# Patient Record
Sex: Female | Born: 1955
Health system: Southern US, Community
[De-identification: ages and names within clinical notes are randomized; demographics above are authoritative.]

## PROBLEM LIST (undated history)

## (undated) DIAGNOSIS — Z8774 Personal history of (corrected) congenital malformations of heart and circulatory system: Secondary | ICD-10-CM

## (undated) DIAGNOSIS — Z87730 Personal history of (corrected) cleft lip and palate: Secondary | ICD-10-CM

## (undated) DIAGNOSIS — Z8659 Personal history of other mental and behavioral disorders: Secondary | ICD-10-CM

## (undated) DIAGNOSIS — R718 Other abnormality of red blood cells: Secondary | ICD-10-CM

## (undated) DIAGNOSIS — M810 Age-related osteoporosis without current pathological fracture: Secondary | ICD-10-CM

## (undated) DIAGNOSIS — R011 Cardiac murmur, unspecified: Secondary | ICD-10-CM

## (undated) DIAGNOSIS — N8501 Benign endometrial hyperplasia: Secondary | ICD-10-CM

## (undated) DIAGNOSIS — K219 Gastro-esophageal reflux disease without esophagitis: Secondary | ICD-10-CM

## (undated) HISTORY — DX: Gastro-esophageal reflux disease without esophagitis: K21.9

## (undated) HISTORY — DX: Cardiac murmur, unspecified: R01.1

## (undated) HISTORY — DX: Personal history of (corrected) cleft lip and palate: Z87.730

## (undated) HISTORY — DX: Age-related osteoporosis without current pathological fracture: M81.0

## (undated) HISTORY — DX: Benign endometrial hyperplasia: N85.01

## (undated) HISTORY — DX: Personal history of other mental and behavioral disorders: Z86.59

## (undated) HISTORY — PX: CLEFT LIP REPAIR: SUR1164

## (undated) HISTORY — PX: ABDOMINAL SURGERY: SHX537

## (undated) HISTORY — DX: Other abnormality of red blood cells: R71.8

## (undated) HISTORY — DX: Personal history of (corrected) congenital malformations of heart and circulatory system: Z87.74

## (undated) HISTORY — PX: HERNIA REPAIR: SHX51

---
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
## 1997-07-09 ENCOUNTER — Encounter: Admission: RE | Admit: 1997-07-09 | Discharge: 1997-07-09 | Payer: Self-pay | Admitting: Sports Medicine

## 1997-08-06 ENCOUNTER — Encounter: Admission: RE | Admit: 1997-08-06 | Discharge: 1997-08-06 | Payer: Self-pay | Admitting: Sports Medicine

## 1997-09-07 ENCOUNTER — Encounter: Admission: RE | Admit: 1997-09-07 | Discharge: 1997-09-07 | Payer: Self-pay | Admitting: Family Medicine

## 1997-09-29 ENCOUNTER — Encounter: Admission: RE | Admit: 1997-09-29 | Discharge: 1997-09-29 | Payer: Self-pay | Admitting: Family Medicine

## 1997-11-05 ENCOUNTER — Encounter: Admission: RE | Admit: 1997-11-05 | Discharge: 1997-11-05 | Payer: Self-pay | Admitting: Sports Medicine

## 1998-03-29 ENCOUNTER — Encounter: Admission: RE | Admit: 1998-03-29 | Discharge: 1998-03-29 | Payer: Self-pay | Admitting: Family Medicine

## 1998-05-28 ENCOUNTER — Other Ambulatory Visit: Admission: RE | Admit: 1998-05-28 | Discharge: 1998-05-28 | Payer: Self-pay | Admitting: Gynecology

## 1998-07-02 ENCOUNTER — Encounter: Admission: RE | Admit: 1998-07-02 | Discharge: 1998-07-02 | Payer: Self-pay | Admitting: Sports Medicine

## 1998-08-05 ENCOUNTER — Encounter: Admission: RE | Admit: 1998-08-05 | Discharge: 1998-08-05 | Payer: Self-pay | Admitting: Sports Medicine

## 1999-01-25 ENCOUNTER — Encounter: Admission: RE | Admit: 1999-01-25 | Discharge: 1999-01-25 | Payer: Self-pay | Admitting: Sports Medicine

## 1999-02-03 ENCOUNTER — Encounter: Admission: RE | Admit: 1999-02-03 | Discharge: 1999-02-03 | Payer: Self-pay | Admitting: Family Medicine

## 1999-02-16 ENCOUNTER — Encounter: Admission: RE | Admit: 1999-02-16 | Discharge: 1999-02-16 | Payer: Self-pay | Admitting: Family Medicine

## 1999-03-08 ENCOUNTER — Encounter: Admission: RE | Admit: 1999-03-08 | Discharge: 1999-03-08 | Payer: Self-pay | Admitting: Family Medicine

## 1999-07-21 ENCOUNTER — Encounter: Admission: RE | Admit: 1999-07-21 | Discharge: 1999-07-21 | Payer: Self-pay | Admitting: Family Medicine

## 1999-10-04 ENCOUNTER — Ambulatory Visit (HOSPITAL_COMMUNITY): Admission: RE | Admit: 1999-10-04 | Discharge: 1999-10-04 | Payer: Self-pay | Admitting: Sports Medicine

## 1999-10-04 ENCOUNTER — Encounter: Payer: Self-pay | Admitting: Sports Medicine

## 2000-01-25 ENCOUNTER — Encounter: Admission: RE | Admit: 2000-01-25 | Discharge: 2000-01-25 | Payer: Self-pay | Admitting: Family Medicine

## 2000-02-16 ENCOUNTER — Encounter: Admission: RE | Admit: 2000-02-16 | Discharge: 2000-02-16 | Payer: Self-pay | Admitting: Sports Medicine

## 2000-02-18 DIAGNOSIS — N8501 Benign endometrial hyperplasia: Secondary | ICD-10-CM

## 2000-02-18 HISTORY — DX: Benign endometrial hyperplasia: N85.01

## 2000-02-21 ENCOUNTER — Encounter (HOSPITAL_COMMUNITY): Admission: RE | Admit: 2000-02-21 | Discharge: 2000-05-21 | Payer: Self-pay | Admitting: Sports Medicine

## 2000-02-23 ENCOUNTER — Other Ambulatory Visit: Admission: RE | Admit: 2000-02-23 | Discharge: 2000-02-23 | Payer: Self-pay | Admitting: Gynecology

## 2000-02-23 ENCOUNTER — Encounter (INDEPENDENT_AMBULATORY_CARE_PROVIDER_SITE_OTHER): Payer: Self-pay

## 2000-03-08 ENCOUNTER — Other Ambulatory Visit: Admission: RE | Admit: 2000-03-08 | Discharge: 2000-03-08 | Payer: Self-pay | Admitting: Gynecology

## 2000-04-05 ENCOUNTER — Encounter: Admission: RE | Admit: 2000-04-05 | Discharge: 2000-04-05 | Payer: Self-pay | Admitting: Sports Medicine

## 2000-05-01 ENCOUNTER — Ambulatory Visit: Admission: RE | Admit: 2000-05-01 | Discharge: 2000-05-01 | Payer: Self-pay | Admitting: Gynecology

## 2000-06-12 ENCOUNTER — Encounter: Admission: RE | Admit: 2000-06-12 | Discharge: 2000-06-12 | Payer: Self-pay | Admitting: Sports Medicine

## 2000-08-27 ENCOUNTER — Encounter: Admission: RE | Admit: 2000-08-27 | Discharge: 2000-08-27 | Payer: Self-pay | Admitting: Family Medicine

## 2000-08-29 ENCOUNTER — Encounter: Admission: RE | Admit: 2000-08-29 | Discharge: 2000-08-29 | Payer: Self-pay | Admitting: Family Medicine

## 2000-11-13 ENCOUNTER — Encounter: Admission: RE | Admit: 2000-11-13 | Discharge: 2000-11-13 | Payer: Self-pay | Admitting: Family Medicine

## 2000-12-13 ENCOUNTER — Encounter: Admission: RE | Admit: 2000-12-13 | Discharge: 2000-12-13 | Payer: Self-pay | Admitting: Sports Medicine

## 2001-01-11 ENCOUNTER — Emergency Department (HOSPITAL_COMMUNITY): Admission: EM | Admit: 2001-01-11 | Discharge: 2001-01-11 | Payer: Self-pay | Admitting: Emergency Medicine

## 2001-01-28 ENCOUNTER — Encounter: Admission: RE | Admit: 2001-01-28 | Discharge: 2001-01-28 | Payer: Self-pay | Admitting: Family Medicine

## 2001-02-06 ENCOUNTER — Encounter: Admission: RE | Admit: 2001-02-06 | Discharge: 2001-02-06 | Payer: Self-pay | Admitting: Family Medicine

## 2001-09-18 ENCOUNTER — Encounter: Admission: RE | Admit: 2001-09-18 | Discharge: 2001-09-18 | Payer: Self-pay | Admitting: Family Medicine

## 2001-10-17 ENCOUNTER — Encounter: Admission: RE | Admit: 2001-10-17 | Discharge: 2001-10-17 | Payer: Self-pay | Admitting: Sports Medicine

## 2001-11-22 ENCOUNTER — Ambulatory Visit (HOSPITAL_COMMUNITY): Admission: RE | Admit: 2001-11-22 | Discharge: 2001-11-22 | Payer: Self-pay | Admitting: Family Medicine

## 2001-11-22 ENCOUNTER — Encounter: Admission: RE | Admit: 2001-11-22 | Discharge: 2001-11-22 | Payer: Self-pay | Admitting: Family Medicine

## 2002-01-23 ENCOUNTER — Encounter: Admission: RE | Admit: 2002-01-23 | Discharge: 2002-01-23 | Payer: Self-pay | Admitting: Sports Medicine

## 2002-03-25 ENCOUNTER — Other Ambulatory Visit: Admission: RE | Admit: 2002-03-25 | Discharge: 2002-03-25 | Payer: Self-pay | Admitting: Gynecology

## 2002-04-16 ENCOUNTER — Encounter: Admission: RE | Admit: 2002-04-16 | Discharge: 2002-04-16 | Payer: Self-pay | Admitting: Family Medicine

## 2002-10-30 ENCOUNTER — Encounter: Admission: RE | Admit: 2002-10-30 | Discharge: 2002-10-30 | Payer: Self-pay | Admitting: Family Medicine

## 2002-12-11 ENCOUNTER — Encounter: Admission: RE | Admit: 2002-12-11 | Discharge: 2002-12-11 | Payer: Self-pay | Admitting: Sports Medicine

## 2003-01-15 ENCOUNTER — Encounter: Admission: RE | Admit: 2003-01-15 | Discharge: 2003-01-15 | Payer: Self-pay | Admitting: Family Medicine

## 2003-05-20 ENCOUNTER — Encounter: Admission: RE | Admit: 2003-05-20 | Discharge: 2003-05-20 | Payer: Self-pay | Admitting: Sports Medicine

## 2003-05-20 ENCOUNTER — Encounter: Admission: RE | Admit: 2003-05-20 | Discharge: 2003-05-20 | Payer: Self-pay | Admitting: Family Medicine

## 2003-06-25 ENCOUNTER — Encounter: Admission: RE | Admit: 2003-06-25 | Discharge: 2003-06-25 | Payer: Self-pay | Admitting: Sports Medicine

## 2003-06-25 ENCOUNTER — Encounter: Admission: RE | Admit: 2003-06-25 | Discharge: 2003-06-25 | Payer: Self-pay | Admitting: Family Medicine

## 2003-10-08 ENCOUNTER — Other Ambulatory Visit: Admission: RE | Admit: 2003-10-08 | Discharge: 2003-10-08 | Payer: Self-pay | Admitting: Gynecology

## 2003-10-13 ENCOUNTER — Encounter: Admission: RE | Admit: 2003-10-13 | Discharge: 2003-10-13 | Payer: Self-pay | Admitting: Gynecology

## 2004-01-04 ENCOUNTER — Ambulatory Visit: Payer: Self-pay | Admitting: Sports Medicine

## 2004-08-08 ENCOUNTER — Ambulatory Visit: Payer: Self-pay | Admitting: Family Medicine

## 2005-03-29 ENCOUNTER — Other Ambulatory Visit: Admission: RE | Admit: 2005-03-29 | Discharge: 2005-03-29 | Payer: Self-pay | Admitting: Gynecology

## 2005-06-08 ENCOUNTER — Ambulatory Visit: Payer: Self-pay | Admitting: Sports Medicine

## 2005-08-03 ENCOUNTER — Ambulatory Visit: Payer: Self-pay | Admitting: Family Medicine

## 2005-08-31 ENCOUNTER — Ambulatory Visit: Payer: Self-pay | Admitting: Sports Medicine

## 2005-10-06 ENCOUNTER — Ambulatory Visit: Payer: Self-pay | Admitting: Family Medicine

## 2005-11-30 ENCOUNTER — Ambulatory Visit: Payer: Self-pay | Admitting: Sports Medicine

## 2006-05-17 DIAGNOSIS — K219 Gastro-esophageal reflux disease without esophagitis: Secondary | ICD-10-CM | POA: Insufficient documentation

## 2006-05-17 DIAGNOSIS — F339 Major depressive disorder, recurrent, unspecified: Secondary | ICD-10-CM | POA: Insufficient documentation

## 2006-05-17 DIAGNOSIS — R4589 Other symptoms and signs involving emotional state: Secondary | ICD-10-CM | POA: Insufficient documentation

## 2006-05-17 DIAGNOSIS — R1310 Dysphagia, unspecified: Secondary | ICD-10-CM | POA: Insufficient documentation

## 2006-05-24 ENCOUNTER — Ambulatory Visit: Payer: Self-pay | Admitting: Sports Medicine

## 2006-05-24 DIAGNOSIS — J329 Chronic sinusitis, unspecified: Secondary | ICD-10-CM | POA: Insufficient documentation

## 2006-06-05 ENCOUNTER — Encounter: Admission: RE | Admit: 2006-06-05 | Discharge: 2006-06-05 | Payer: Self-pay | Admitting: Sports Medicine

## 2006-07-26 ENCOUNTER — Ambulatory Visit: Payer: Self-pay | Admitting: Sports Medicine

## 2006-07-26 ENCOUNTER — Encounter (INDEPENDENT_AMBULATORY_CARE_PROVIDER_SITE_OTHER): Payer: Self-pay | Admitting: *Deleted

## 2006-07-26 DIAGNOSIS — Z8774 Personal history of (corrected) congenital malformations of heart and circulatory system: Secondary | ICD-10-CM | POA: Insufficient documentation

## 2006-07-27 ENCOUNTER — Telehealth: Payer: Self-pay | Admitting: Psychology

## 2006-08-09 ENCOUNTER — Ambulatory Visit: Payer: Self-pay | Admitting: Family Medicine

## 2006-08-27 ENCOUNTER — Ambulatory Visit: Payer: Self-pay | Admitting: Psychology

## 2006-09-10 ENCOUNTER — Ambulatory Visit: Payer: Self-pay | Admitting: Sports Medicine

## 2006-09-12 ENCOUNTER — Ambulatory Visit: Payer: Self-pay | Admitting: Family Medicine

## 2006-09-17 ENCOUNTER — Ambulatory Visit: Payer: Self-pay | Admitting: Psychology

## 2006-09-25 ENCOUNTER — Encounter: Payer: Self-pay | Admitting: Sports Medicine

## 2006-10-17 ENCOUNTER — Telehealth: Payer: Self-pay | Admitting: Psychology

## 2006-10-31 ENCOUNTER — Encounter: Payer: Self-pay | Admitting: Sports Medicine

## 2006-12-20 ENCOUNTER — Ambulatory Visit: Payer: Self-pay | Admitting: Sports Medicine

## 2006-12-26 ENCOUNTER — Telehealth: Payer: Self-pay | Admitting: *Deleted

## 2007-02-08 ENCOUNTER — Telehealth: Payer: Self-pay | Admitting: Sports Medicine

## 2007-02-20 ENCOUNTER — Encounter: Payer: Self-pay | Admitting: *Deleted

## 2007-03-01 ENCOUNTER — Ambulatory Visit: Payer: Self-pay | Admitting: Family Medicine

## 2007-03-01 ENCOUNTER — Encounter: Payer: Self-pay | Admitting: Family Medicine

## 2007-04-08 ENCOUNTER — Encounter: Payer: Self-pay | Admitting: Sports Medicine

## 2007-05-30 ENCOUNTER — Encounter: Payer: Self-pay | Admitting: Sports Medicine

## 2007-05-30 ENCOUNTER — Encounter: Payer: Self-pay | Admitting: *Deleted

## 2007-06-12 ENCOUNTER — Encounter: Admission: RE | Admit: 2007-06-12 | Discharge: 2007-06-12 | Payer: Self-pay | Admitting: Sports Medicine

## 2007-06-18 ENCOUNTER — Telehealth: Payer: Self-pay | Admitting: *Deleted

## 2007-07-31 ENCOUNTER — Encounter: Admission: RE | Admit: 2007-07-31 | Discharge: 2007-07-31 | Payer: Self-pay | Admitting: Sports Medicine

## 2007-08-06 ENCOUNTER — Encounter (INDEPENDENT_AMBULATORY_CARE_PROVIDER_SITE_OTHER): Payer: Self-pay | Admitting: *Deleted

## 2007-08-20 ENCOUNTER — Ambulatory Visit: Payer: Self-pay | Admitting: Sports Medicine

## 2007-09-19 ENCOUNTER — Telehealth: Payer: Self-pay | Admitting: *Deleted

## 2007-10-16 ENCOUNTER — Ambulatory Visit: Payer: Self-pay | Admitting: Family Medicine

## 2007-10-22 ENCOUNTER — Ambulatory Visit: Payer: Self-pay | Admitting: Family Medicine

## 2007-10-24 ENCOUNTER — Ambulatory Visit: Payer: Self-pay | Admitting: Family Medicine

## 2007-11-07 ENCOUNTER — Ambulatory Visit: Payer: Self-pay | Admitting: Sports Medicine

## 2007-11-07 DIAGNOSIS — Q675 Congenital deformity of spine: Secondary | ICD-10-CM | POA: Insufficient documentation

## 2007-11-07 LAB — CONVERTED CEMR LAB
ALT: 10 units/L (ref 0–35)
AST: 15 units/L (ref 0–37)
Albumin: 4.7 g/dL (ref 3.5–5.2)
Alkaline Phosphatase: 86 units/L (ref 39–117)
BUN: 14 mg/dL (ref 6–23)
CO2: 27 meq/L (ref 19–32)
Calcium: 9.6 mg/dL (ref 8.4–10.5)
Chloride: 105 meq/L (ref 96–112)
Cholesterol: 194 mg/dL (ref 0–200)
Creatinine, Ser: 0.62 mg/dL (ref 0.40–1.20)
Glucose, Bld: 87 mg/dL (ref 70–99)
HCT: 38.4 % (ref 36.0–46.0)
HDL: 65 mg/dL (ref >39)
Hemoglobin: 11.6 g/dL — ABNORMAL LOW (ref 12.0–15.0)
LDL Cholesterol: 116 mg/dL — ABNORMAL HIGH (ref 0–99)
MCHC: 30.2 g/dL (ref 30.0–36.0)
MCV: 71 fL — ABNORMAL LOW (ref 78.0–100.0)
Platelets: 208 10*3/uL (ref 150–400)
Potassium: 5 meq/L (ref 3.5–5.3)
RBC: 5.41 M/uL — ABNORMAL HIGH (ref 3.87–5.11)
RDW: 17.2 % — ABNORMAL HIGH (ref 11.5–15.5)
Sodium: 144 meq/L (ref 135–145)
Total Bilirubin: 0.6 mg/dL (ref 0.3–1.2)
Total CHOL/HDL Ratio: 3
Total Protein: 7.3 g/dL (ref 6.0–8.3)
Triglycerides: 63 mg/dL (ref <150)
VLDL: 13 mg/dL (ref 0–40)
WBC: 4.8 10*3/uL (ref 4.0–10.5)

## 2007-11-12 ENCOUNTER — Telehealth (INDEPENDENT_AMBULATORY_CARE_PROVIDER_SITE_OTHER): Payer: Self-pay | Admitting: *Deleted

## 2007-12-02 ENCOUNTER — Encounter: Payer: Self-pay | Admitting: *Deleted

## 2007-12-05 ENCOUNTER — Encounter: Payer: Self-pay | Admitting: Sports Medicine

## 2007-12-19 ENCOUNTER — Encounter: Payer: Self-pay | Admitting: Family Medicine

## 2007-12-30 ENCOUNTER — Encounter: Payer: Self-pay | Admitting: *Deleted

## 2007-12-30 ENCOUNTER — Ambulatory Visit: Payer: Self-pay | Admitting: Family Medicine

## 2008-01-07 ENCOUNTER — Ambulatory Visit: Payer: Self-pay | Admitting: Family Medicine

## 2008-01-08 ENCOUNTER — Encounter: Payer: Self-pay | Admitting: Family Medicine

## 2008-02-25 ENCOUNTER — Ambulatory Visit: Payer: Self-pay | Admitting: Family Medicine

## 2008-02-25 DIAGNOSIS — G252 Other specified forms of tremor: Secondary | ICD-10-CM

## 2008-02-25 DIAGNOSIS — M818 Other osteoporosis without current pathological fracture: Secondary | ICD-10-CM | POA: Insufficient documentation

## 2008-02-25 DIAGNOSIS — G250 Essential tremor: Secondary | ICD-10-CM | POA: Insufficient documentation

## 2008-02-26 ENCOUNTER — Ambulatory Visit: Payer: Self-pay | Admitting: Family Medicine

## 2008-02-26 ENCOUNTER — Encounter (INDEPENDENT_AMBULATORY_CARE_PROVIDER_SITE_OTHER): Payer: Self-pay | Admitting: *Deleted

## 2008-02-26 ENCOUNTER — Encounter: Payer: Self-pay | Admitting: Family Medicine

## 2008-02-26 LAB — CONVERTED CEMR LAB
BUN: 16 mg/dL (ref 6–23)
Basophils Absolute: 0 10*3/uL (ref 0.0–0.1)
Basophils Relative: 1 % (ref 0–1)
CO2: 22 meq/L (ref 19–32)
Calcium: 9.4 mg/dL (ref 8.4–10.5)
Chloride: 104 meq/L (ref 96–112)
Creatinine, Ser: 0.73 mg/dL (ref 0.40–1.20)
Eosinophils Absolute: 0.1 10*3/uL (ref 0.0–0.7)
Eosinophils Relative: 2 % (ref 0–5)
Glucose, Bld: 85 mg/dL (ref 70–99)
HCT: 38.5 % (ref 36.0–46.0)
Hemoglobin: 12.2 g/dL (ref 12.0–15.0)
Lymphocytes Relative: 30 % (ref 12–46)
Lymphs Abs: 1.2 10*3/uL (ref 0.7–4.0)
MCHC: 31.7 g/dL (ref 30.0–36.0)
MCV: 73.3 fL — ABNORMAL LOW (ref 78.0–100.0)
Monocytes Absolute: 0.5 10*3/uL (ref 0.1–1.0)
Monocytes Relative: 11 % (ref 3–12)
Neutro Abs: 2.3 10*3/uL (ref 1.7–7.7)
Neutrophils Relative %: 56 % (ref 43–77)
Platelets: 206 10*3/uL (ref 150–400)
Potassium: 4 meq/L (ref 3.5–5.3)
RBC: 5.25 M/uL — ABNORMAL HIGH (ref 3.87–5.11)
RDW: 16.1 % — ABNORMAL HIGH (ref 11.5–15.5)
Sodium: 140 meq/L (ref 135–145)
TSH: 1.519 microintl units/mL (ref 0.350–4.50)
Vit D, 1,25-Dihydroxy: 19 — ABNORMAL LOW (ref 30–89)
WBC: 4.1 10*3/uL (ref 4.0–10.5)

## 2008-03-26 ENCOUNTER — Encounter: Payer: Self-pay | Admitting: Family Medicine

## 2008-08-26 ENCOUNTER — Encounter: Admission: RE | Admit: 2008-08-26 | Discharge: 2008-08-26 | Payer: Self-pay | Admitting: Family Medicine

## 2008-10-19 ENCOUNTER — Ambulatory Visit: Payer: Self-pay | Admitting: Family Medicine

## 2008-10-21 ENCOUNTER — Ambulatory Visit: Payer: Self-pay | Admitting: Family Medicine

## 2008-11-11 ENCOUNTER — Encounter: Payer: Self-pay | Admitting: Family Medicine

## 2008-11-11 ENCOUNTER — Ambulatory Visit: Payer: Self-pay | Admitting: Family Medicine

## 2008-12-22 ENCOUNTER — Ambulatory Visit: Payer: Self-pay | Admitting: Family Medicine

## 2009-04-23 ENCOUNTER — Ambulatory Visit: Payer: Self-pay | Admitting: Family Medicine

## 2009-04-23 ENCOUNTER — Telehealth: Payer: Self-pay | Admitting: Family Medicine

## 2009-04-30 ENCOUNTER — Ambulatory Visit: Payer: Self-pay | Admitting: Family Medicine

## 2009-04-30 DIAGNOSIS — J309 Allergic rhinitis, unspecified: Secondary | ICD-10-CM | POA: Insufficient documentation

## 2009-06-22 ENCOUNTER — Encounter: Payer: Self-pay | Admitting: Family Medicine

## 2009-07-02 ENCOUNTER — Ambulatory Visit: Payer: Self-pay | Admitting: Family Medicine

## 2009-07-02 DIAGNOSIS — H6980 Other specified disorders of Eustachian tube, unspecified ear: Secondary | ICD-10-CM | POA: Insufficient documentation

## 2009-07-19 ENCOUNTER — Ambulatory Visit: Payer: Self-pay | Admitting: Family Medicine

## 2009-07-21 ENCOUNTER — Ambulatory Visit: Payer: Self-pay | Admitting: Family Medicine

## 2009-07-21 ENCOUNTER — Telehealth: Payer: Self-pay | Admitting: Family Medicine

## 2009-07-22 ENCOUNTER — Telehealth (INDEPENDENT_AMBULATORY_CARE_PROVIDER_SITE_OTHER): Payer: Self-pay | Admitting: *Deleted

## 2009-08-19 ENCOUNTER — Encounter: Payer: Self-pay | Admitting: Family Medicine

## 2009-08-26 ENCOUNTER — Encounter: Admission: RE | Admit: 2009-08-26 | Discharge: 2009-08-26 | Payer: Self-pay | Admitting: Family Medicine

## 2009-08-30 ENCOUNTER — Encounter: Payer: Self-pay | Admitting: Family Medicine

## 2009-11-30 ENCOUNTER — Telehealth: Payer: Self-pay | Admitting: *Deleted

## 2009-12-01 ENCOUNTER — Encounter: Payer: Self-pay | Admitting: Family Medicine

## 2009-12-22 ENCOUNTER — Ambulatory Visit: Payer: Self-pay | Admitting: Family Medicine

## 2010-01-20 ENCOUNTER — Encounter: Payer: Self-pay | Admitting: Family Medicine

## 2010-04-10 ENCOUNTER — Encounter: Payer: Self-pay | Admitting: Family Medicine

## 2010-04-15 ENCOUNTER — Ambulatory Visit: Admission: RE | Admit: 2010-04-15 | Discharge: 2010-04-15 | Payer: Self-pay | Source: Home / Self Care

## 2010-04-19 NOTE — Assessment & Plan Note (Signed)
Summary: something stuck under nail wp   Vital Signs:  Patient Profile:   55 Years Old Female Height:     65.25 inches Weight:      118.1 pounds Temp:     97.9 degrees F Pulse rate:   82 / minute BP sitting:   129 / 73  (left arm)  Pt. in pain?   yes    Location:   finger    Intensity:   1    Type:       sharp  Vitals Entered By: Theresia Lo RN (October 16, 2007 1:32 PM)              Is Patient Diabetic? No     PCP:  Enid Baas MD  Chief Complaint:  splinter under nail.  History of Present Illness: CC:  splinter Pt fell against wooden dresser this am.  Splinter lodged under nail of middle finger of R hand.  Mild pain.  No bleeding.  Tried to remove w/ pin but unable.  Not up to date on tetanus       Review of Systems      See HPI   Physical Exam  General:     Well-developed,well-nourished,in no acute distress; alert,appropriate and cooperative throughout examination Extremities:     3 mm splinter R middle finger.  very small part protrudes from outside nail.  No erythema, bleeding    Impression & Recommendations:  Problem # 1:  ACC CAUSED OTH SPEC CUT&PIERCING INSTRUM/OBJS (ICD-E920.8) Assessment: New Splinter removed w/ forcepts.  BAnd-Aid applied.  f/u as needed .  tetanus shot given.  No evidence of infection.  Precepted...initially thought we may need digital block for nail removal.   Orders: [facility]- Est Level  3 (86578)   Other Orders: Tdap => 6yrs IM (46962) Admin 1st Vaccine (95284)   Patient Instructions: 1)  Please schedule a follow-up appointment as needed. 2)  If your finger becomes red and swollen, return to clinic   ]    Tetanus/Td Vaccine    Vaccine Type: Tdap    Site: left deltoid    Mfr: Sanofi Pasteur    Dose: 0.5 ml    Route: IM    Given by: Theresia Lo RN    Exp. Date: 06/18/2009    Lot #: [id number]    VIS given: 02/05/07 version given October 16, 2007.

## 2010-04-19 NOTE — Letter (Signed)
Summary: Generic Letter  [facility]  [address]   [place], [place] [address]   Phone: [phone]  Fax: [phone]    02/26/2008  Marie Harvey [address] [place], [place]  [address]  Dear Ms. FUENTE,   It was a pleasure to see you in the office yesterday.  I have received the results of your lab studies, and I am glad to report that the majority of the results are within the normal range.   The only abnormality was the Vitamin D level, which is low.  Therefore, I recommend that you take prescription-strength Vitamin D, 50,000 International Units (IUs), once weekly for six weeks.  We will recheck the Vitamin D level about 3 months after completing the six weeks of weekly treatments.   I will send the prescription for the once-weekly Vitamin D tablets to your pharmacy.  Please call if you have any questions.    Sincerely,     Paula Compton MD [facility]  Appended Document: Generic Letter sent

## 2010-04-19 NOTE — Assessment & Plan Note (Signed)
Summary: flu shot,tcb  Nurse Visit   Vital Signs:  Patient profile:   55 year old female Temp:     98.6 degrees F  Vitals Entered By: Theresia Lo RN (December 22, 2009 10:35 AM)  Allergies: 1)  ! Penicillin 2)  Codeine 3)  * Cannot Take Gel Caps Due To Swallowing Problems  Immunizations Administered:  Influenza Vaccine # 1:    Vaccine Type: Fluvax Non-MCR    Site: right deltoid    Mfr: GlaxoSmithKline    Dose: 0.5 ml    Route: IM    Given by: Theresia Lo RN    Exp. Date: 09/14/2010    Lot #: [id number]    VIS given: 10/12/09 version given December 22, 2009.  Flu Vaccine Consent Questions:    Do you have a history of severe allergic reactions to this vaccine? no    Any prior history of allergic reactions to egg and/or gelatin? no    Do you have a sensitivity to the preservative Thimersol? no    Do you have a past history of Guillan-Barre Syndrome? no    Do you currently have an acute febrile illness? no    Have you ever had a severe reaction to latex? no    Vaccine information given and explained to patient? yes    Are you currently pregnant? no  Orders Added: 1)  Influenza Vaccine NON MCR [00028] 2)  Admin 1st Vaccine [47829]

## 2010-04-19 NOTE — Letter (Signed)
Summary: Out of Work  [facility]  [address]   [place], [place] [address]   Phone: [phone]  Fax: [phone]    December 01, 2009   Patient:  Marie Harvey DOB: September 25, 1955   To Whom It May Concern:   This letter is to attest that [name] is a patient in this office, and she does not require antibiotic prophylaxis for dental treatments.    If you need additional information, please feel free to contact our office.         Sincerely,    Paula Compton MD  Appended Document: Out of Work called pt, says she will pick up letter tomorrow. left up front.

## 2010-04-19 NOTE — Assessment & Plan Note (Signed)
Summary: f/up per dr.fields/ts   Vital Signs:  Patient Profile:   55 Years Old Female Weight:      100 pounds Pulse rate:   81 / minute BP sitting:   106 / 55  Vitals Entered By: Lillia Pauls CMA (Jul 26, 2006 9:59 AM)               Chief Complaint:  fu.  History of Present Illness: Esophageal narrowing and dysphagia: Barium swallow shoes retiention of 13 MM tablet pills very difficult to swallow Foods - most she can swallow OK if she chews well No problem with liquids  Always thin.  hard to say if this has limited her weight gain.  appetite is fine. small meals and does not eat a lot of snacks. slow eater.  Hx of pneumonia in 2005 This triggered a lot fo reflux and burping Now no real reflux sxs  Now uses zyrtec when it rains mostly Sinuses pretty good this month This will also trigger headaches  Depression is moderate currently.  Has not yet called Dr. Pascal Lux.  Current Problems:  DM W/COMPLICATION NOS, TYPE II, UNCONTROLLED (ICD-250.92) RENAL FAILURE (ICD-586) SINUSITIS, CHRONIC, NOS (ICD-473.9) GASTROESOPHAGEAL REFLUX, NO ESOPHAGITIS (ICD-530.81) DYSPHAGIA (ICD-787.2) DEPRESSION, MAJOR, RECURRENT (ICD-296.30) ANEMIA, ACUTE BLOOD LOSS (ICD-285.1) DYSPHAGIA, UNSPECIFIED (ICD-787.20) SYMPTOM, ABNORMAL LOSS OF WEIGHT (ICD-783.21) DISORDER, NONPSYCHOTIC MENTAL NOS (ICD-300.9) SINUSITIS, CHRONIC (ICD-473.9)      Past Medical History:    Reviewed history from 05/24/2006 and no changes required:       coarctation of pulmonary artery        facial reconstructive surgery        h Pylori -neg        pneumonia        tetralogy of Fallot       SBE porphylaxis - high risk give 2 gms keflex 1 hr prior to dental procedures   Family History:    Reviewed history from 05/24/2006 and no changes required:       father died 70/ cva/ cad/ ?etoh/ tobacco        mother endometrial ca, DJD, very healthy at 64  Social History:    Reviewed history from 05/17/2006 and no  changes required:       non smoker/ no etoh; difficulty keeping jobs; lives with mother/ never married     Physical Exam  General:     alert, underweight appearing, and pale.   Head:     Normocephalic and atraumatic without obvious abnormalities. No apparent alopecia or balding. Eyes:     glasses Ears:     External ear exam shows no significant lesions or deformities.  Otoscopic examination reveals clear canals, tympanic membranes are intact bilaterally without bulging, retraction, inflammation or discharge. Hearing is grossly normal bilaterally. Nose:     mild congestion Rt > lt Mouth:     throat showed no erythema today Chest Wall:     surgical scarring noted form cardiac surgery Lungs:     Normal respiratory effort, chest expands symmetrically. Lungs are clear to auscultation, no crackles or wheezes. Heart:     diatolic and systolic murmurs along sternal boorder and base are unchanged    Impression & Recommendations:  Problem # 1:  DYSPHAGIA (ICD-787.2) Assessment: Unchanged with difficulty swallowing confirmed on barium swallow I would like her to see GI specialist Is this part of her weight loss - unsure  Problem # 2:  DEPRESSION, MAJOR, RECURRENT (ICD-296.30) Assessment: Unchanged reminded again to call  Dr. Pascal Lux Orders: [facility]- Est Level  3 (78295)   Problem # 3:  CONGENITAL HEART DISEASE, HX OF (ICD-V12.59) Assessment: Unchanged stable on no meds Orders: [facility]- Est Level  3 (62130)

## 2010-04-19 NOTE — Progress Notes (Signed)
Summary: REQUEST TO SPEAK TO MD OR THEKLA  Phone Note Call from Patient Call back at Home Phone [phone]   Caller: MOM -[name] Summary of Call: REQUESTING TO SPEAK WITH DR Jimmy Picket [facility] CONTACT AT [phone] Initial call taken by: Dedra Skeens CMA,,  September 19, 2007 11:12 AM  Follow-up for Phone Call        CALLED PT'S MOM. she reports, that pt had endoscopy yesterday by dr.peters. they could not put the tube beyond esophagus due to obstruction. they wanted to order Barium Swallow study, but pt does not want to do this test again. had the test done about one year ago. request to fax test results to dr.peters. faxed result to [phone] (from 06-05-07) per pt's request. Follow-up by: Arlyss Repress CMA,,  September 19, 2007 11:37 AM

## 2010-04-19 NOTE — Assessment & Plan Note (Signed)
Summary: read tb/eo  Nurse Visit   TB test negative.  Pt given proof of this per her request. ............................................... Shanda Bumps [facility] October 21, 2008 9:58 AM   Allergies: 1)  ! Penicillin  Orders Added: 1)  No Charge Patient Arrived (NCPA0) [NCPA0]

## 2010-04-19 NOTE — Assessment & Plan Note (Signed)
Summary: FLU SHOT  Nurse Visit       Influenza Vaccine    Vaccine Type: Fluvax MCR    Site: left deltoid    Mfr: GlaxoSmithKline    Dose: 0.5 ml    Route: IM    Given by: Dennison Nancy RN    Exp. Date: 09/16/2008    Lot #: [id number]    VIS given: 10/11/06 version given December 30, 2007.  Flu Vaccine Consent Questions    Do you have a history of severe allergic reactions to this vaccine? no    Any prior history of allergic reactions to egg and/or gelatin? no    Do you have a sensitivity to the preservative Thimersol? no    Do you have a past history of Guillan-Barre Syndrome? no    Do you currently have an acute febrile illness? no    Have you ever had a severe reaction to latex? no    Vaccine information given and explained to patient? yes    Are you currently pregnant? no   Orders Added: 1)  Influenza Vaccine MCR [00025] 2)  Est Level 1- [facility] [21308] 3)  Flu Vaccine & Administration [place].Dew    ]

## 2010-04-19 NOTE — Consult Note (Signed)
[facility]: [facility]   Imported By: Clydell Hakim 09/03/2009 15:27:22  _____________________________________________________________________  External Attachment:    Type:   Image     Comment:   External Document

## 2010-04-19 NOTE — Letter (Signed)
Summary: *Referral Letter  [facility]  [address]   [place], [place] [address]   Phone: [phone]  Fax: [phone]    05/30/2007 Dr. Rodena Piety [facility]  Dear Dr. Noe Gens:  Thank you in advance for agreeing to see my patient:  Marie Harvey [address] [place], [place]  [address]  Phone: [phone]  Reason for Referral: History of dysphagia and concern for Plummer vinson syndrome; recurrent anemia without frank GI blood loss.  Procedures Requested: your evaluation and probable endoscopy and colonoscopy  Current Medical Problems: 1)  SCREENING FOR MALIGNANT NEOPLASM OF THE CERVIX (ICD-V76.2) 2)  SYMPTOMATIC MENOPAUSAL/FEMALE CLIMACTERIC STATES (ICD-627.2) 3)  UNSPECIFIED BREAST SCREENING (ICD-V76.10) 4)  SCREENING, PULMONARY TUBERCULOSIS (ICD-V74.1) 5)  CONGENITAL HEART DISEASE, HX OF (ICD-V12.59) 8)  SINUSITIS, CHRONIC, NOS (ICD-473.9) 9)  GASTROESOPHAGEAL REFLUX, NO ESOPHAGITIS (ICD-530.81) 10)  DYSPHAGIA (ICD-787.2) 11)  DEPRESSION, MAJOR, RECURRENT (ICD-296.30) 12)  ANEMIA, ACUTE BLOOD LOSS (ICD-285.1 14)  SYMPTOM, ABNORMAL LOSS OF WEIGHT (ICD-783.21)     Current Medications: occassional Ambien; periodic antihistamines  Past Medical History: 1)  coarctation of pulmonary artery 2)   facial reconstructive surgery 3)   h Pylori -neg 4)   pneumonia 5)   tetralogy of Fallot 6)  SBE porphylaxis - high risk give 2 gms keflex 1 hr prior to dental procedures  Prior History of Blood Transfusions: last in 2003   Thank you again for agreeing to see our patient; please contact us if you have any further questions or need additional information.  Sincerely,  Enid Baas MD  Appended Document: *Referral Letter faxed referral and last 2 ov...  Appended Document: *Referral Letter fax # [phone]

## 2010-04-19 NOTE — Progress Notes (Signed)
Summary: Cancel [facility] appt   Phone Note Call from Patient   Caller: Patient Call For: Spero Geralds, Psy.D. Summary of Call: Marie Harvey called to cancel her appointment for tomorrow.  She said she would call to reschedule.  I asked if there was anything going on and she stated she had gotten a job and had things to do.  I asked if she had any feedback for me as her therapist and she said there wasn't.  We seemed to have established a decent therapeutic alliance.  I am not sure if this is job related or if something else has come up for her.  Will route to Dr. Darrick Penna for review and / or comment if he has any insight. Initial call taken by: Spero Geralds PsyD,  October 17, 2006 10:17 AM

## 2010-04-19 NOTE — Assessment & Plan Note (Signed)
Summary: L ear,jaw swollen & painful/[place]/breen   Vital Signs:  Patient profile:   55 year old female Height:      65.25 inches Weight:      128.5 pounds BMI:     21.30 Temp:     97.5 degrees F Pulse rate:   89 / minute BP sitting:   119 / 78  (left arm) Cuff size:   regular  Vitals Entered By: Gladstone Pih (Jul 21, 2009 9:50 AM) CC: C/O left ear painful, jaw swollen and painful since 5am this morning Is Patient Diabetic? No Pain Assessment Patient in pain? no        Primary Care Provider:  Paula Compton MD  CC:  C/O left ear painful and jaw swollen and painful since 5am this morning.  History of Present Illness: Seen 5/2 for URI symptoms.  Since then worse.  Had spell of significant nausea with vomiting.  Now with Left ear pain and jaw pain and swelling.  No suspected dental issues.  S/P cleft palate repair so at higher risk for sinusitis and otitis.  Habits & Providers  Alcohol-Tobacco-Diet     Tobacco Status: never  Current Medications (verified): 1)  Propranolol Hcl 40 Mg/48ml Soln (Propranolol Hcl) .... Sig: Take 1 Tsp By Mouth Two Times A Day For Essential Tremor Disp: Quantity Sufficient 1 Month 2)  Calcium 500/d 500-400 Mg-Unit Chew (Calcium-Vitamin D) .... Chew 1 Tab By Mouth Twice Daily 3)  Flonase 50 Mcg/act Susp (Fluticasone Propionate) .... Sig: Apply 2 Sprays To Each Nostril One Time Daily Generic Formulation Okay 4)  Ambien 10 Mg Tabs (Zolpidem Tartrate) .... Sig: Take 1/2 To 1 Tab By Mouth At Bedtime As Needed For Insomnia Generic Please. 5)  Tessalon Perles 100 Mg Caps (Benzonatate) .... Take 1-2 Tabs By Mouth Three Times A Day 6)  Pseudoephedrine Hcl 60 Mg Tabs (Pseudoephedrine Hcl) .... One By Mouth Every 6 Hours As Needed 7)  Azithromycin 250 Mg  Tabs (Azithromycin) .... 2 By  Mouth Today and Then 1 Daily For 4 Days  Allergies: 1)  ! Penicillin 2)  Codeine 3)  * Cannot Take Gel Caps Due To Swallowing Problems  Past History:  Past medical, surgical,  family and social histories (including risk factors) reviewed, and no changes noted (except as noted below).  Past Medical History: Reviewed history from 07/02/2009 and no changes required. coarctation of pulmonary artery  facial reconstructive surgery  h Pylori -neg  pneumonia  tetralogy of Fallot SBE porphylaxis - high risk give 2 gms keflex 1 hr prior to dental procedures  LMP October 2008.  Menopausal.  Past Surgical History: Reviewed history from 05/17/2006 and no changes required. reapir of tetralogy of fallot - [place] - 04/10/2000  Family History: Reviewed history from 05/24/2006 and no changes required. father died 70/ cva/ cad/ ?etoh/ tobacco  mother endometrial ca, DJD, very healthy at 48  Social History: Reviewed history from 05/17/2006 and no changes required. non smoker/ no etoh; difficulty keeping jobs; lives with mother/ never married  Physical Exam  General:  Well-developed,well-nourished,in no acute distress; alert,appropriate and cooperative throughout examination Head:  no tenderness to percussion of sinuses Ears:  External ear exam shows no significant lesions or deformities.  Otoscopic examination reveals clear canals, tympanic membranes are intact bilaterally without bulging, retraction, inflammation or discharge. Hearing is grossly normal bilaterally. Mouth:  Throat is injected Neck:  small, non tender Lt high ant cervical adenopathy Lungs:  Normal respiratory effort, chest expands symmetrically. Lungs are  clear to auscultation, no crackles or wheezes.   Impression & Recommendations:  Problem # 1:  SINUSITIS, CHRONIC, NOS (ICD-473.9)  Will treat with antibiotics due to 6 day illness, worsening and high risk patient Her updated medication list for this problem includes:    Flonase 50 Mcg/act Susp (Fluticasone propionate) ..... Sig: apply 2 sprays to each nostril one time daily generic formulation okay    Tessalon Perles 100 Mg Caps (Benzonatate) .Marland Kitchen...  Take 1-2 tabs by mouth three times a day    Pseudoephedrine Hcl 60 Mg Tabs (Pseudoephedrine hcl) ..... One by mouth every 6 hours as needed    Azithromycin 250 Mg Tabs (Azithromycin) .Marland Kitchen... 2 by  mouth today and then 1 daily for 4 days  Orders: [facility]- Est Level  3 (47829)  Problem # 2:  UPPER RESPIRATORY INFECTION, VIRAL (ICD-465.9)  Her updated medication list for this problem includes:    Tessalon Perles 100 Mg Caps (Benzonatate) .Marland Kitchen... Take 1-2 tabs by mouth three times a day  Orders: [facility]- Est Level  3 (56213)  Complete Medication List: 1)  Propranolol Hcl 40 Mg/33ml Soln (Propranolol hcl) .... Sig: take 1 tsp by mouth two times a day for essential tremor disp: quantity sufficient 1 month 2)  Calcium 500/d 500-400 Mg-unit Chew (Calcium-vitamin d) .... Chew 1 tab by mouth twice daily 3)  Flonase 50 Mcg/act Susp (Fluticasone propionate) .... Sig: apply 2 sprays to each nostril one time daily generic formulation okay 4)  Ambien 10 Mg Tabs (Zolpidem tartrate) .... Sig: take 1/2 to 1 tab by mouth at bedtime as needed for insomnia generic please. 5)  Tessalon Perles 100 Mg Caps (Benzonatate) .... Take 1-2 tabs by mouth three times a day 6)  Pseudoephedrine Hcl 60 Mg Tabs (Pseudoephedrine hcl) .... One by mouth every 6 hours as needed 7)  Azithromycin 250 Mg Tabs (Azithromycin) .... 2 by  mouth today and then 1 daily for 4 days Prescriptions: AZITHROMYCIN 250 MG  TABS (AZITHROMYCIN) 2 by  mouth today and then 1 daily for 4 days  #6 x 0   Entered and Authorized by:   Doralee Albino MD   Signed by:   Doralee Albino MD on 07/21/2009   Method used:   Electronically to        [id number] (retail)       [address]       [place], [place]  [address]       Ph: [phone]       Fax: [phone]   RxID:   [id number]

## 2010-04-19 NOTE — Miscellaneous (Signed)
Summary: appt with dr.mann/ts  Clinical Lists Changes SCHED.APPT WITH DR.MANN FOR ENDOSCOPY [id number]. 03-27-06 AT 2:45PM. CALLED PT AND LMAM WITH APPT INFO. FAXED INFO TO DR.MANN ...................................................................THEKLA SLADE CMA,  February 20, 2007 4:53 PM

## 2010-04-19 NOTE — Miscellaneous (Signed)
Summary: email to pcp  Clinical Lists Changes  having [facility]. they had wanted records. not needed now.[place] Circle RN  December 02, 2007 12:31 PM

## 2010-04-19 NOTE — Assessment & Plan Note (Signed)
Summary: READ PPD/FIELDS/[facility]  Nurse Visit       PPD Results    Date of reading: 10/24/2007    Results: 0 mm    Interpretation: negative     ]  Appended Document: Orders Update    Clinical Lists Changes  Orders: Added new Service order of No Charge Patient Arrived (NCPA0) (NCPA0) - Signed

## 2010-04-19 NOTE — Letter (Signed)
Summary: *Referral Letter  [facility]  [address]   [place], [place] [address]   Phone: [phone]  Fax:     07/26/2006  DearDr. Hayden Rasmussen:  Thank you in advance for agreeing to see my patient:  Marie Harvey [address] [place], [place]  [address]  Phone: [phone]  Reason for Referral: significant dysphagia with pills and on barium swallow could not swallow 13 MM tablet.  suggested that she has cervical esophageal webs.  Please evaluate.  I am not sure if this contributes to this patients weight loss and thinness.  Procedures Requested:   Current Medical Problems: 1)  CONGENITAL HEART DISEASE, HX OF (ICD-V12.59) 2)  DM W/COMPLICATION NOS, TYPE II, UNCONTROLLED (ICD-250.92) 3)  RENAL FAILURE (ICD-586) 4)  SINUSITIS, CHRONIC, NOS (ICD-473.9) 5)  GASTROESOPHAGEAL REFLUX, NO ESOPHAGITIS (ICD-530.81) 6)  DYSPHAGIA (ICD-787.2) 7)  DEPRESSION, MAJOR, RECURRENT (ICD-296.30) 8)  ANEMIA, ACUTE BLOOD LOSS (ICD-285.1) 9)  DYSPHAGIA, UNSPECIFIED (ICD-787.20) 10)  SYMPTOM, ABNORMAL LOSS OF WEIGHT (ICD-783.21) 11)  DISORDER, NONPSYCHOTIC MENTAL NOS (ICD-300.9) 12)  SINUSITIS, CHRONIC (ICD-473.9)   Current Medications:   Past Medical History: 1)  coarctation of pulmonary artery 2)   facial reconstructive surgery 3)   h Pylori -neg 4)   pneumonia 5)   tetralogy of Fallot 6)  SBE porphylaxis - high risk give 2 gms keflex 1 hr prior to dental procedures   Family History: 1)  father died 70/ cva/ cad/ ?etoh/ tobacco 2)   mother endometrial ca, DJD, very healthy at 64    Social History: 1)  non smoker/ no etoh; difficulty keeping jobs; lives with mother/ never married   Pertinent Labs:    Thank you again for agreeing to see our patient; please contact us if you have any further questions or need additional information.  Sincerely,     Enid Baas MD

## 2010-04-19 NOTE — Miscellaneous (Signed)
Summary: bone density test/ts  Clinical Lists Changes bone density test [facility] 06-12-07 at 2:40pm. informed pt of appt ....................................................................THEKLA SLADE CMA,  May 30, 2007 12:46 PM

## 2010-04-19 NOTE — Assessment & Plan Note (Signed)
Summary: Initial Behavioral Medicine    Visit Type:  Behavioral Medicine PCP:  Enid Baas MD   History of Present Illness: Marie Harvey thought her appointment was at 2:30 and was 20 minutes late for her 2:00 appointment.  We spent 30 minutes together focused on her family and work history as well as symptoms of depression.  In terms of family, Marie Harvey's parents were married until her father's death from a stroke in 18-Aug-1994.  Marie Harvey took this especially hard.  She had a better relationship with her Dad than she does with her mother.  Marie Harvey has one younger brother (age 68) who is married with three childre (11, 9, & 7).  Marie Harvey stated her family is not very emotional; they don't do a good job of talking about problems or difficult situations.    Marie Harvey lives with her mother who is 30 years old and in good health.  Her mother works full-time at [employer]  She describes her mother as highly critical.  She thinks it has always been this way but her ability to tolerate it has decreased in recent years.    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  She was fired for being sick so much.  Since that time, she has worked as a Lawyer at [employer] and most recently, for [employer].  She quit on April 1st, 2008 because she could no longer work with the people.  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  The turn over and the fact she was not able to get the schedule she wanted "gave [her] no choice" but to quit.  She has been looking for work since April but has been unsuccessful.  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  Besides looking for work and reading, Marie Harvey spends her day playing with  her cat.  She is also taking a Editor, commissioning class on Tuesdays because she "just had to get out of the house."  She attends church at [employer].  She takes Ambien for sleep issues.    Impression & Recommendations:  Problem # 1:  DEPRESSION, MAJOR, RECURRENT (ICD-296.30) Marie Harvey reports a history of at least two depressive episodes.  For her, depression is feeling unhappy and irritable.  She has hypersomnia, no change in appetite, varying levels of energy (to none at all to very high), difficulty remembering things / focusing, feeling lonely and thoughts about wanting to be dead.  She denies active suicidal ideation and expresses no intent or plan.  We did not address anhedonia or feelings of guilt.  In addition to shifts in energy (even when depressed), Marie Harvey reports times when she stays up all night (but never more than one night in a row).   A PHQ-9 revealed minimum symtpoms of depression.  In the last two weeks she has had difficulty sleeping and has felt badly about herself "nearly every day."  She also indicated feeling down or depressed and feeling tired or having little energy several days.  She denied impairment on the form.  The scoring of this self-report measure would indicate a low likelihood of a depressive disorder.  The MDQ was  negative with six endorsed statements including irritability, self-confidence, talkative, distracted, increased energy and spending money that got her into trouble.  She denied impairment on this form as well.    Marie Harvey presents a somewhat confusing picture.  There seems to be some inconsistency with her verbal report of symptoms and what she indicated on the self-report measures.  Not sure if this is due to a lack of insight, a lack of a significant psychiatric issue or something else.  I will consult with her PCP, Dr. Darrick Penna, to get his sense of things.    [phone]   Patient Instructions: 1)  Schedule a behavioral  medicine follow-up for June 9th (next available). 2)  We will finish the clinical interview and discuss options. 3)  I will also give feedback on the self-report measures she completed today.

## 2010-04-19 NOTE — Assessment & Plan Note (Signed)
Summary: f/up,tcb   Vital Signs:  Patient profile:   55 year old female Height:      65.25 inches Weight:      134 pounds BMI:     22.21 Pulse rate:   90 / minute BP sitting:   138 / 79  (left arm) Cuff size:   regular  Vitals Entered By: Arlyss Repress CMA, (July 02, 2009 1:58 PM)  Serial Vital Signs/Assessments:  Time      Position  BP       Pulse  Resp  Temp     By                     128/82                         Paula Compton MD                     126/78                         Paula Compton MD  Comments: L arm sitting manual By: Paula Compton MD  L arm sitting manual By: Paula Compton MD   CC: f/up right ear and ENT Is Patient Diabetic? No Pain Assessment Patient in pain? no        Primary Care Provider:  Paula Compton MD  CC:  f/up right ear and ENT.  History of Present Illness: Marie Harvey comes in today to discuss several items.  She continues to have the RIGHT ear humming and pounding sound, without pain.  She has bilat hearing loss, seen recently by Dr Lazarus Salines (her ENT).  I have reviewed his progress notes from his visit with her dated April 5th, 2011.  Eustacian tube dysfunction, bilat hearing loss with recommendation that she use bilat hearing aides.  Did not feel there was anything surgical to be done.   She has appt with AudD Dr Charise Carwin in [facility], is hoping he will have additional recommendations for her.   Planning to travel to [place] in 2 days for a 10-day trip with mother, brother, and niece-nephews.  Is looking forward to the trip. Trouble sleeping, especially in hard unfamiliar beds during travel.  Asks for Ambien, which she splits in half and work well for her.   We reviewed her DEXA scan from 2009, she remembers taking the loading doses of Vitamin D but then forgot about the maintenance dosing.  Has not been taking it. WOuld like to consider chewable Viactiv with D; doesn;t like the tablets of calcium.  We discussed her eating and activity  patterns. She is looking for work, and is relatively inactive.  Waiting for weather to improve so that she can get outside, get to the pool.  Has gym membership but doesn't go.  Eats fruits and vegetables, but admits to drinking about 4 cans Coke daily (doesn't like diet Coke), also eats ice cream just before bed.  Snacks at 2am, gets very hungry overnight.   Affect feels fairly upbeat.  Hopeful about getting a job.    Habits & Providers  Alcohol-Tobacco-Diet     Tobacco Status: never  Current Medications (verified): 1)  Propranolol Hcl 40 Mg/55ml Soln (Propranolol Hcl) .... Sig: Take 1 Tsp By Mouth Two Times A Day For Essential Tremor Disp: Quantity Sufficient 1 Month 2)  Calcium 500/d 500-400 Mg-Unit Chew (Calcium-Vitamin D) .... Chew 1 Tab  By Mouth Twice Daily 3)  Flonase 50 Mcg/act Susp (Fluticasone Propionate) .... Sig: Apply 2 Sprays To Each Nostril One Time Daily Generic Formulation Okay 4)  Ambien 10 Mg Tabs (Zolpidem Tartrate) .... Sig: Take 1/2 To 1 Tab By Mouth At Bedtime As Needed For Insomnia Generic Please.  Allergies (verified): 1)  ! Penicillin  Past History:  Past Medical History: coarctation of pulmonary artery  facial reconstructive surgery  h Pylori -neg  pneumonia  tetralogy of Fallot SBE porphylaxis - high risk give 2 gms keflex 1 hr prior to dental procedures  LMP October 2008.  Menopausal.  Family History: Reviewed history from 05/24/2006 and no changes required. father died 70/ cva/ cad/ ?etoh/ tobacco  mother endometrial ca, DJD, very healthy at 60  Physical Exam  General:  well appearing, no apparent distress Ears:  No TM erythema bilaterally. No cerumen visible. Mouth:  clear oropharynx Neck:  neck supple without adenopathy Lungs:  Normal respiratory effort, chest expands symmetrically. Lungs are clear to auscultation, no crackles or wheezes. Heart:  Regular S1S2, audible systolic M   Impression & Recommendations:  Problem # 1:  EUSTACHIAN  TUBE DYSFUNCTION, RIGHT (ICD-381.81)  Patient with eustacian tube dysfunction, seen most recently by her ENT Dr. Flo Shanks on April 5th.  Has Right sided pounding and humming sound which is worse at nighttime.  Has an upcoming appointment with Dr Charise Carwin, AuD in [facility], to address this as well as her hearing loss.  Dr Raye Sorrow note from April 5th reviewed, he recommends bilateral hearing aides at this point.   Orders: [facility]- Est  Level 4 ([address])  Problem # 2:  IDIOPATHIC OSTEOPOROSIS (ICD-733.02) Discussed prior DEXA scan done on June 12, 2007, which showed T-score -2.5 in L hip.  She was given Vit D supplementation and she has forgotten to continue this. Discussed use of more palatable Viactiv or other chewable form.  She is not a candidate for bisphosphonates because of esophageal stricture history and instrumentation in the past.  Her updated medication list for this problem includes:    Calcium 500/d 500-400 Mg-unit Chew (Calcium-vitamin d) .Marland Kitchen... Chew 1 tab by mouth twice daily  Problem # 3:  WEIGHT GAIN (ICD-783.1)  Slow and steady weight gain.  Discussed eating habits (drinks 4 Coke cans a day, and eats ice cream every night just before bed. Snacks at 2am on most mornings. Admits to not doing physical activity).  Discussed ways to make small but meaningful changes that will have her feel better, help her keep her weight under control.    Orders: [facility]- Est  Level 4 ([address])  Complete Medication List: 1)  Propranolol Hcl 40 Mg/9ml Soln (Propranolol hcl) .... Sig: take 1 tsp by mouth two times a day for essential tremor disp: quantity sufficient 1 month 2)  Calcium 500/d 500-400 Mg-unit Chew (Calcium-vitamin d) .... Chew 1 tab by mouth twice daily 3)  Flonase 50 Mcg/act Susp (Fluticasone propionate) .... Sig: apply 2 sprays to each nostril one time daily generic formulation okay 4)  Ambien 10 Mg Tabs (Zolpidem tartrate) .... Sig: take 1/2 to 1 tab by mouth at bedtime as needed  for insomnia generic please.  Patient Instructions: 1)  It was great to see you today.  Enjoy your upcoming trip to [place]! 2)  I recommend you to take calcium supplements, with Vitamin D.  Supplements of calcium 1200mg  a day, plus vitamin D 800 International Units/day, is recommended.  3)  I recommend trying to make  a small change in your dietary intake. Perhaps reducing your Coke intake by one soda/day, or reducing the number of times you eat ice cream at night.  4)  With the warmer weather, try to increase your physical activity. Swimming, biking, fast walkiing, will help to build your endurance and make you feel better.  5)  I am glad to hear you will be seeing Dr. Valentina Lucks.  Please ask him to send me his notes about your visit.  Prescriptions: AMBIEN 10 MG TABS (ZOLPIDEM TARTRATE) SIG: Take 1/2 to 1 tab by mouth at bedtime as needed for insomnia Generic please.  #20 x 0   Entered and Authorized by:   Paula Compton MD   Signed by:   Paula Compton MD on 07/02/2009   Method used:   Print then Give to Patient   RxID:   [id number]

## 2010-04-19 NOTE — Assessment & Plan Note (Signed)
Summary: tb test,tcb  Nurse Visit    Allergies: 1)  ! Penicillin  Immunizations Administered:  PPD Skin Test:    Vaccine Type: PPD    Site: left forearm    Mfr: Sanofi Pasteur    Dose: 0.1 ml    Route: ID    Given by: Theresia Lo RN    Exp. Date: 04/17/2011    Lot #: [id number]  Orders Added: 1)  TB Skin Test [86580] 2)  Admin 1st Vaccine [90471] 3)  Est Level 1- [facility] [40347]

## 2010-04-19 NOTE — Consult Note (Signed)
Summary: [facility]  [facility]   Imported By: Knox Royalty 12/26/2007 11:54:13  _____________________________________________________________________  External Attachment:    Type:   Image     Comment:   External Document

## 2010-04-19 NOTE — Progress Notes (Signed)
Summary: triage  Phone Note Call from Patient Call back at Home Phone [phone]   Caller: Patient Summary of Call: woke up to painful ear ache Initial call taken by: De Nurse,  Jul 21, 2009 8:38 AM  Follow-up for Phone Call        jaw is swollen & painful as well. L side. this woke her at 5am today. wants to come in asap. will see Dr. Leveda Anna at 9:45 am Follow-up by: Golden Circle RN,  Jul 21, 2009 8:55 AM

## 2010-04-19 NOTE — Miscellaneous (Signed)
Summary: call from audiologist  [facility] rec'd message from Dr. Carroll Sage who saw her for hearing issues. states if Dr. Mauricio Po wants to speak with him call [phone]. the OV notes will be faxed here this week.Golden Circle RN  August 30, 2009 10:38 AM      Returned call to Dr. Valentina Lucks at above number.  Left message that I was returning call. Paula Compton MD  August 30, 2009 1:58 PM

## 2010-04-19 NOTE — Progress Notes (Signed)
Summary: calcium and vit d/ts  ---- Converted from flag ---- ---- 06/18/2007 3:33 PM, Royal Hawthorn FIELDS MD wrote: Let both trudies know that they really need to push calcium and vit D - up the D to 800 U per day.  calcium to at least 1500 mgm - osteoporotic change at hip ------------------------------ left message for pt to call back....................................................................THEKLA SLADE CMA,  June 18, 2007 3:54 PM called pt and informed.....................................................................THEKLA SLADE CMA,  June 19, 2007 11:55 AM

## 2010-04-19 NOTE — Progress Notes (Signed)
Summary: Schedule Beh Med appt  Phone Note Call from Patient   Caller: Patient Call For: Spero Geralds, Psy.D. Summary of Call: "[name]" called to schedule an appointment.  She was first able to attend on May 22nd at 2:00.  She was referred on 1/ 10/08 by Dr. Darrick Penna for "chronic depressive symptoms / low self-esteem and work related issues." Initial call taken by: Spero Geralds PsyD,  Jul 27, 2006 11:59 AM

## 2010-04-19 NOTE — Miscellaneous (Signed)
Summary: re: chewable calcium tabs/ts  [facility] pharmacy called. re: chewable vit d and calcium tabs. only available in 500mg  calcium and vit d. ok'd change.Arlyss Repress CMA,  December 22, 2008 11:54 AM fwd. to dr.breen for review.    I agree with the change.  Paula Compton MD  December 23, 2008 11:20 AM

## 2010-04-19 NOTE — Consult Note (Signed)
Summary: [facility]   Imported By: Bradly Bienenstock 10/31/2006 11:13:23  _____________________________________________________________________  External Attachment:    Type:   Image     Comment:   External Document  Appended Document: [facility] elevated ocular pressures at 24. mother has open angle glaucoma

## 2010-04-19 NOTE — Assessment & Plan Note (Signed)
Summary: f/u,df   Vital Signs:  Patient profile:   55 year old female Weight:      127.6 pounds Pulse rate:   80 / minute BP sitting:   130 / 82  (right arm)  Vitals Entered By: Renato Battles slade,cma CC: swelling under left breast x 2-3 days. Is Patient Diabetic? No Pain Assessment Patient in pain? no        Primary Care Provider:  Paula Compton MD  CC:  swelling under left breast x 2-3 days.Marie Harvey  History of Present Illness: Patient presents today for concern about a nonpainful, palpable mass which she has noticed along the lower L thorax over the past few days.  Incidental finding.  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  Has not had pain associated, no weight changes, no diarrhea or constipation, no blood in stool.    Habits & Providers  Alcohol-Tobacco-Diet     Tobacco Status: never  Allergies: 1)  ! Penicillin  Physical Exam  General:  Well appearing, no apparent distress.  Chest Wall:  Left lower anterior thorax with palpable, firm nontender mass measuring 5x7cm; lateral border delineated by healed scar.  Abdomen:  Soft, nontender.  No abdominal masses noted. Multiple old healed abd scars noted. No skin changes or erythema seen on exam.    Impression & Recommendations:  Problem # 1:  LIPOMA OF OTHER SKIN AND SUBCUTANEOUS TISSUE (ICD-214.1)  Reassurance given about the nature of this mass, which does not appear to be intra-abdominal and which is consistent with a subcutaneous lipoma by exam. Gave patient the option of pursuing surgical evaluation for possible removal, which she declines.   Orders: [facility]- Est Level  3 ([address])  Problem # 2:  IDIOPATHIC OSTEOPOROSIS (ICD-733.02)  Patient had previously been found to be vitamin D deficient.  Completed high-dose repletion, will begin maintenance dosing now.  The following medications were removed from the medication list:    Vitamin D 38756 Unit Caps (Ergocalciferol)  ..... Sig: take 1 capsule by mouth one time weekly for six weeks Her updated medication list for this problem includes:    Calcium 500/d 500-400 Mg-unit Chew (Calcium-vitamin d) .Marie Harvey... Chew 1 tab by mouth twice daily  Orders: [facility]- Est Level  3 ([address])  Complete Medication List: 1)  Propranolol Hcl 40 Mg/76ml Soln (Propranolol hcl) .... Sig: take 1 tsp by mouth two times a day for essential tremor disp: quantity sufficient 1 month 2)  Calcium 500/d 500-400 Mg-unit Chew (Calcium-vitamin d) .... Chew 1 tab by mouth twice daily  Other Orders: Influenza Vaccine NON MCR (51884) Prescriptions: CALCIUM 500/D 500-400 MG-UNIT CHEW (CALCIUM-VITAMIN D) Chew 1 tab by mouth twice daily  #60 x 12   Entered and Authorized by:   Paula Compton MD   Signed by:   Paula Compton MD on 12/22/2008   Method used:   Electronically to        [facility] # [phone]* (retail)       [address] [place], [place]  [address]       Ph: [phone]       Fax: [phone]   RxID:   [id number] PROPRANOLOL HCL 40 MG/5ML SOLN (PROPRANOLOL HCL) SIG: Take 1 tsp by mouth two times a day for essential tremor DISP: Quantity sufficient 1 month  #300 x 12   Entered and Authorized by:   Paula Compton MD   Signed by:  Paula Compton MD on 12/22/2008   Method used:   Electronically to        [facility] # [phone]* (retail)       [address] [place], [place]  [address]       Ph: [phone]       Fax: [phone]   RxID:   [id number]    Influenza Vaccine    Vaccine Type: Fluvax Non-MCR    Site: left deltoid    Mfr: GlaxoSmithKline    Dose: 0.5 ml    Route: IM    Given by: Arlyss Repress CMA,    Exp. Date: 09/16/2009    Lot #: [id number]    VIS given: 10/11/06 version given December 22, 2008.  Flu Vaccine Consent Questions    Do you have a history of severe allergic reactions to this vaccine? no    Any prior history of allergic reactions to egg and/or gelatin? no    Do you have a sensitivity  to the preservative Thimersol? no    Do you have a past history of Guillan-Barre Syndrome? no    Do you currently have an acute febrile illness? no    Have you ever had a severe reaction to latex? no    Vaccine information given and explained to patient? yes    Are you currently pregnant? no

## 2010-04-19 NOTE — Assessment & Plan Note (Signed)
Summary: KNEE/ SHOULDER PAIN/  DPG   Vital Signs:  Patient Profile:   55 Years Old Female Height:     65.25 inches Weight:      117 pounds Pulse rate:   86 / minute BP sitting:   120 / 81  Vitals Entered By: Lillia Pauls CMA (August 20, 2007 10:01 AM)                 PCP:  Enid Baas MD  Chief Complaint:  RIGHT KNEE AND R SHOULDER PAIN.  History of Present Illness: Pleasant 55 y/o female comes in for right elbow and right knee pain since March after a fall. Since then she has experienced burning, aching type pain in lateral aspect of right elbow. Pain is aggravated by picking up objects or turning door knobs. Denies any previous injury. No swelling. For her right knee, there is pain medially, no swelling. Aggravated with long walks. Denies any previous injury other than fall. Denies any mechanical symptoms. No significant bruising after the fall. Has tried ibuprofen as needed for pain for both joints with minimal relief.     Past Medical History:    Reviewed history from 05/24/2006 and no changes required:       coarctation of pulmonary artery        facial reconstructive surgery        h Pylori -neg        pneumonia        tetralogy of Fallot       SBE porphylaxis - high risk give 2 gms keflex 1 hr prior to dental procedures   Family History:    Reviewed history from 05/24/2006 and no changes required:       father died 70/ cva/ cad/ ?etoh/ tobacco        mother endometrial ca, DJD, very healthy at 18    Review of Systems  The patient denies fever, headaches, and muscle weakness.     Physical Exam  General:     Well-developed,well-nourished,in no acute distress; alert,appropriate and cooperative throughout examination Msk:     Right elbow: No bony abnormality No swelling Full ROM without crepitation Tender to palp at lateral epicondyle Pain with resisted wrist extension and resisted wrist pronation  Right hip: No bony abnormality Full extension and  flexion 15 deg IR, 50 deg ER 4/5 strength ADduction, otherwise 5/5 throughout  Left hip: No bony abnormality Full extension and flexion 50 deg IR, 45 deg ER 4/5 strength ABduction, otherwise 5/5 throughout  Right knee: No bony abnormality Mild swelling at pes anserinus, minimally tender to palp Non tender along joint line Full ROM without crepitation Negative patellar apprehension or ballotement All ligaments intact and stable Negative McMurray's Pulses:     R and L radial pulses are full and equal Extremities:     Bilat pes cavus Equal leg lengths bilat Gait: walks with out turned right foot Neurologic:     Sensory functions appear intact bilateral lower extremities    Impression & Recommendations:  Problem # 1:  LATERAL EPICONDYLITIS, RIGHT (ICD-726.32) Assessment: New  Orders: [facility]- Est Level  3 ([address])   Problem # 2:  KNEE PAIN, RIGHT, CHRONIC (ICD-719.46) Assessment: New Capsular sprain Orders: [facility]- Est Level  3 ([address])    Patient Instructions: 1)  For hips: 2)  Perform leg exercises 3 sets of 10 reps daily, as directed in handout, including bilateral straight leg raises and hip rotation, right adductor strengthening exercises and left abduction  strengthening exercises. 3)  Build up to 3 sets of 30 reps daily. 4)  Walk pigeon toed emphasizing turning right foot inward. 5)  For wrist: 6)  Perform wrist curls with 1 pound weight, 3 sets of 10 reps daily. 7)  Perform wrist rotations with 1 pound weight, 3 sets of 10 reps daily. 8)  Ice elbow and knees 15-20 minutes twice daily. 9)  Please schedule a follow-up appointment as needed in 6 weeks if symptoms persist or worsen.   ]

## 2010-04-19 NOTE — Letter (Signed)
Summary: Generic Letter  [facility]  [address]   [place], [place] [address]   Phone: [phone]  Fax: [phone]    02/25/2008  Dr. Kandee Keen Department of Gastroenterology [facility] [place], [place]   Re: Marie Harvey [address] [place], [place]  [address]  Dear Dr. Madilyn Hook,   I am Ms. Marie Harvey' family physician, and I write to request your opinion regarding the appropriateness of starting her on a bisphosphonate for osteoporosis, in light of her esophageal disorder for which you are evaluating her.   I appreciate your thoughtful opinion regarding the safety of oral bisphosphonates in her case.   Thank you for the care you give to our mutual patient.    Sincerely,   Paula Compton MD [facility] Tel [phone] Fax [phone]

## 2010-04-19 NOTE — Progress Notes (Signed)
Summary: Return call  Phone Note Call from Patient Call back at Home Phone [phone]   Summary of Call: Pt states she is returning a call to someone, isn't sure what is about. Initial call taken by: Haydee Salter,  November 12, 2007 2:43 PM  Follow-up for Phone Call        pt notified regarding labs.  Follow-up by: Alphia Kava,  November 12, 2007 3:07 PM

## 2010-04-19 NOTE — Progress Notes (Signed)
Summary: Rx Prob  Phone Note Call from Patient Call back at Home Phone [phone]   Caller: Patient Summary of Call: Pt said rx went to wrong pharmacy should go to [facility]. Initial call taken by: Clydell Hakim,  Jul 22, 2009 9:10 AM  Follow-up for Phone Call        pharm called and I gave rx over phone.  they were OK with that. Follow-up by: De Nurse,  Jul 22, 2009 11:13 AM  Additional Follow-up for Phone Call Additional follow up Details #1::        rx  cancelled at [facility]. Additional Follow-up by: Theresia Lo RN,  Jul 22, 2009 11:23 AM

## 2010-04-19 NOTE — Progress Notes (Signed)
Summary: triage  Phone Note Call from Patient Call back at Home Phone [phone]   Caller: Patient Summary of Call: Right ear is humming. Initial call taken by: Clydell Hakim,  April 23, 2009 8:39 AM  Follow-up for Phone Call        started 2-3 wks ago. used Debrox. still humming. has a cold now. work in with her pcp at 1:30. aware there may be a wait Follow-up by: Golden Circle RN,  April 23, 2009 8:51 AM

## 2010-04-19 NOTE — Assessment & Plan Note (Signed)
Summary: pap/female issues/bmc   Vital Signs:  Patient Profile:   55 Years Old Female Weight:      107 pounds Temp:     98.4 degrees F oral Pulse rate:   94 / minute BP sitting:   149 / 69  (left arm)  Pt. in pain?   no  Vitals Entered By: Garen Grams LPN (March 01, 2007 2:37 PM)                  PCP:  Enid Baas MD   History of Present Illness: Here for pelvic exam and PAP.  Very nevous about it.  LMP 6 months ago.  Having hot flashes, mostly at night.  Does not want to use estrogens.  Last intimate relationship ended 5 years ago.  She is unemployed, lives with her mother.  She did see Dr. Pascal Lux for counseling and decided not to continue. Her relationship with her mother is strained, and always has been, she was much closer with her father.       Review of Systems  General      Denies fatigue, loss of appetite, and weight loss.  ENT      Complains of difficulty swallowing, nasal congestion, postnasal drainage, and sinus pressure.  CV      Denies chest pain or discomfort.  GI      Denies change in bowel habits.  GU      Denies abnormal vaginal bleeding and discharge.  MS      Denies joint pain.  Psych      Denies depression.   Physical Exam  General:     Thin, alert.  Obvious nasal quality from congenital palate malformations. Nose:     Very dry mucus membranes, could not visualize turbinates. Neck:     supple and no masses.   Lungs:     Normal respiratory effort, chest expands symmetrically. Lungs are clear to auscultation, no crackles or wheezes. Heart:     Normal rate and regular rhythm. S1 and S2 normal without gallop, murmur, click, rub or other extra sounds. Abdomen:     Bowel sounds positive,abdomen soft and non-tender without masses, organomegaly or hernias noted. Genitalia:     Small introitus, called out in pain during the exam.no vaginal discharge.  No masses palpated on bimanual.    Impression & Recommendations:  Problem #  1:  SYMPTOMATIC MENOPAUSAL/FEMALE CLIMACTERIC STATES (ICD-627.2) Will check bone density, as she is thin and may need proplylaxix for osteoporosis. In the meantime to increase calcium adn Vit D.  Recommended black cohash if wanting to try herbal treatement for hot flashes. Orders: Dexa scan (Dexa scan) [facility] - Est  40-64 yrs (69629)   Problem # 2:  UNSPECIFIED BREAST SCREENING (ICD-V76.10)  Orders: Mammogram (Mammogram)   Problem # 3:  SCREENING FOR MALIGNANT NEOPLASM OF THE CERVIX (ICD-V76.2) Very painful for her to have pelvic.  She is not sexually active and has never had am abnormal, recommend every 2-3 year screening. Orders: [facility] - Est  40-64 yrs (52841)   Problem # 4:  DISORDER, NONPSYCHOTIC MENTAL NOS (ICD-300.9) Looking for a job, does not want to continue counseling at this time.   Patient Instructions: 1)  Black Cohash may relieve symptoms of menopause 2)  Take calcium +Vitamin D daily. 1500 mg of calcium and 1000 mg of      Vitamin D.    ]

## 2010-04-19 NOTE — Assessment & Plan Note (Signed)
Summary: fu wp   Vital Signs:  Patient Profile:   55 Years Old Female Weight:      103.8 pounds Pulse rate:   80 / minute BP sitting:   118 / 75  Pt. in pain?   no  Vitals Entered By: Arlyss Repress CMA, (December 20, 2006 9:03 AM)              Is Patient Diabetic? No     PCP:  Enid Baas MD  Chief Complaint:  flu shot.  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  WF NAD facial scars over lip area Head:     Normocephalic and atraumatic without obvious abnormalities. No apparent alopecia or balding. Eyes:     glasses Ears:     unremarkable Nose:     deviation of Rt nostril Mouth:     Oral mucosa and oropharynx without lesions or exudates.  Teeth in good repair. Lungs:     Normal respiratory effort, chest expands symmetrically. Lungs are clear to auscultation, no crackles or wheezes. Heart:     To/ Fro Murmur heard well over LSB brief and soft diastolic component G3 systolic component prominent S 2 Psych:     Oriented X3, normally interactive, good eye contact, and not depressed appearing.      Impression & Recommendations:  Problem # 1:  CONGENITAL HEART DISEASE, HX OF (ICD-V12.59) Assessment: Unchanged no changes in current medicines Orders: [facility]- Est Level  3 (63016)   Problem # 2:  SINUSITIS, CHRONIC, NOS (ICD-473.9) I suggested used only the Claritin-D do not use additional over-the-counter cold  medications given samples of the steroid nasal spray given samples of nexium as her reflux may affect this Orders: [facility]- Est Level  3 ([id number])   Problem # 3:  DYSPHAGIA (ICD-787.2)  encouraged to go back to gastroenterology for endoscopy  as needed follow up  Orders: [facility]- Est Level  3 ([id number])   Other Orders: Flu Vaccine 29yrs + (32202) Admin 1st Vaccine (54270)     ]  Influenza Vaccine    Vaccine Type: Fluvax 3+    Site: left deltoid    Mfr: Sanofi Pasteur    Dose: 0.5 ml    Route: IM    Given by: Arlyss Repress CMA,    Exp. Date: 09/17/2007    Lot #: [id number]    VIS given: 09/16/04 version given December 20, 2006.  Flu Vaccine Consent Questions    Do you have a history of severe allergic reactions to this vaccine? no    Any prior history of allergic reactions to egg and/or gelatin? no    Do you have a sensitivity to the preservative Thimersol? no    Do you have a past history of Guillan-Barre Syndrome? no    Do you currently have an acute febrile illness? no    Have you ever had  a severe reaction to latex? no    Vaccine information given and explained to patient? yes    Are you currently pregnant? no

## 2010-04-19 NOTE — Miscellaneous (Signed)
  Clinical Lists Changes  Problems: Removed problem of NEED PROPHYLACTIC VACCINATION&INOCULATION FLU (ICD-V04.81) Removed problem of UPPER RESPIRATORY INFECTION, VIRAL (ICD-465.9) Removed problem of SCREENING, PULMONARY TUBERCULOSIS (ICD-V74.1) Removed problem of NEED PROPHYLACTIC VACCINATION&INOCULATION FLU (ICD-V04.81) Removed problem of SCREENING FOR LIPOID DISORDERS (ICD-V77.91) Removed problem of LATERAL EPICONDYLITIS, RIGHT (ICD-726.32) Removed problem of SCREENING FOR MALIGNANT NEOPLASM OF THE CERVIX (ICD-V76.2) Removed problem of UNSPECIFIED BREAST SCREENING (ICD-V76.10) Removed problem of SCREENING, PULMONARY TUBERCULOSIS (ICD-V74.1) Removed problem of RENAL FAILURE (ICD-586) Removed problem of ANEMIA, ACUTE BLOOD LOSS (ICD-285.1) Removed problem of SYMPTOM, ABNORMAL LOSS OF WEIGHT (ICD-783.21) Removed problem of DISORDER, NONPSYCHOTIC MENTAL NOS (ICD-300.9) Removed problem of SINUSITIS, CHRONIC, NOS (ICD-473.9) Removed problem of LIPOMA OF OTHER SKIN AND SUBCUTANEOUS TISSUE (ICD-214.1) Removed problem of CERUMEN IMPACTION, RIGHT (ICD-380.4) Removed problem of CENTRAL PERFORATION OF TYMPANIC MEMBRANE (ICD-384.21) Removed problem of KNEE PAIN, RIGHT, CHRONIC (ICD-719.46) Removed problem of WEIGHT GAIN (ICD-783.1) Removed problem of DYSPHAGIA, UNSPECIFIED (ICD-787.20) Removed problem of SYMPTOMATIC MENOPAUSAL/FEMALE CLIMACTERIC STATES (ICD-627.2)

## 2010-04-19 NOTE — Progress Notes (Signed)
Summary: question from pt/ts  Phone Note Call from Patient   Caller: Patient Call For: Marie Baas MD Summary of Call: pt called and request endoscopic procedure to be sched. due to dysphagia. told pt, that dr.fields is not in office until next week. i will fwd. request to him. pt agreed and is willing to wait for dr.fields input. pt's #[phone] Initial call taken by: Arlyss Repress CMA,,  February 08, 2007 10:53 AM         Appended Document: question from pt/ts called pt. [facility]  Appended Document: question from pt/ts pt is returning call from last week, may be reached at [phone], seh sts we can schedule an appt for her anytime in January in the afternoon with Dr. Loreta Ave

## 2010-04-19 NOTE — Assessment & Plan Note (Signed)
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  She has orientation on July 11th and expects to work soon after that.  She thinks she will be able to make her own schedule and hopes to work 40 hours a week.  She anticipates she may wish to return to school in the fall and will have to adjust her hours in order to allow for that.  She thinks getting a job has positively affected her sense of self.  She is looking toward the goal of moving out of her mother's house.  She thinks this would greatly improve the relationship with her mother.          Impression & Recommendations:  Problem # 1:  DEPRESSION, MAJOR, RECURRENT (ICD-296.30) Marie Harvey's mother wrote a letter to Dr. Evelina Dun that detailed some history as well as what she thinks our some of Marie Harvey's biggest issues - mostly depression and an inability to accept any level of criticism or direction.  The letter highlights how differently the two see the problem.  Marie Harvey reports that her mother "constantly criticizes" and when angry, resorts to calling her names.  While Marie Harvey says she has learned to let most of these things roll off her back, it is clear from talking to her that she has a poor sense of her self and that often the comments hit their mark.  Marie Harvey does acknowledge the possibility that she "hears" criticism from neutral statements.  This is an expected relationship dynamic given the situation.  It could likely be discussed in conjoint therapy but I am concerned at this point that neither one is ready for it.  Once again, Marie Harvey does not appear depressed.  She allows room for interpretation such as the one above and does accept some responsibility for how things have gone.  She is looking forward to things such as a trip to [place] for a wedding and to the  start of her job.  Her success in this job might provide some additional data about her current level of function.  We discussed limit setting with her mother.  She likely sets them in a childlike manner that proves ineffective.  Marie Harvey says that her mother's memory makes it difficult for her to set any limits because her mother forgets the very next day.  We discussed a way to manage this as well.  Marie Harvey seems to think this is a fairly futile intervention.   Orders: Therapy 20-30 min- [facility] [phone])    Patient Instructions: 1)  Follow up in Behavioral Medicine on July 31st at 4:00 which is the earliest we could do. 2)  She is to call earlier if she runs into significant stress with her new job or at home.

## 2010-04-19 NOTE — Consult Note (Signed)
Summary: [facility]  [facility]   Imported By: Haydee Salter 01/23/2008 14:33:35  _____________________________________________________________________  External Attachment:    Type:   Image     Comment:   External Document

## 2010-04-19 NOTE — Assessment & Plan Note (Signed)
Summary: FU/WK   Vital Signs:  Patient Profile:   55 Years Old Female Weight:      101 pounds Pulse rate:   81 / minute BP sitting:   125 / 71  Vitals Entered By: Lillia Pauls CMA (May 24, 2006 10:26 AM)               Chief Complaint:  F/U FROM RESCHEDULED VISIT AND ISSUES WITH CHOKING ON FOOD.  History of Present Illness: URI sxs/ chronic sinusitis January to present still has had URI and sinus sxs. 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  Dysphagia difficulty swallowing/  feels like it gets stuck/ got worse after bronchosopy in a study 1997 occassional heart burn liquids are no problem/  meats difficult swallows too fast - leads to choking    Past Medical History:    coarctation of pulmonary artery     facial reconstructive surgery     h Pylori -neg     pneumonia     tetralogy of Fallot    SBE porphylaxis - high risk give 2 gms keflex 1 hr prior to dental procedures   Family History:    father died 70/ cva/ cad/ ?etoh/ tobacco     mother endometrial ca, DJD, very healthy at 38   Risk Factors:  Tobacco use:  never   Review of Systems       see HPI   Physical Exam  General:     underweight appearing and pale.   Head:     scarring over lip/ speech change Eyes:     glasses Ears:     mild chronic scarring changes Nose:     external deformity.   turbinates not swollen or inflamed today Mouth:     scarring in soft and hard palate from prior surg no redness Lungs:     Normal respiratory effort, chest expands symmetrically. Lungs are clear to auscultation, no crackles or wheezes. Heart:     grade 3 /6 HSM.  beat heard at base but does radiate toward back  and neck  midline scarring    Impression & Recommendations:  Problem # 1:  SINUSITIS, CHRONIC (ICD-473.9) Assessment: Improved encouraged use of nasal saline encouraged OTC claritin or zyrtec accolate - can decide if helpful and wean if not  Problem # 2:  DISORDER, NONPSYCHOTIC MENTAL NOS (ICD-300.9) Assessment: Unchanged Long standing problems with self esteem and job losses agrees that she would like counseling gets angry at work/ but takes out frustration at home on mother depressive sxs frequently but no manic symptoms  Given Dr. Mariane Duval number for appt  Problem # 3:  DYSPHAGIA, UNSPECIFIED (ICD-787.20) Assessment: Deteriorated will get barium swallow   Patient Instructions: 1)  APPT FOR BARIUM SWALLOW @ GI FOR 3.13.8 @ 1:45P  Appended Document: Orders Update    Clinical Lists Changes  Orders: Added new Test order of [facility]- Est Level  3 (45409) - Signed

## 2010-04-19 NOTE — Assessment & Plan Note (Signed)
Summary: TO READ PPD /AW  Nurse Visit      PPD Results    Date of reading: 09/12/2006    Results: < 5mm    Interpretation: negative ...................................................................Altamese Dilling CMA,  September 12, 2006 1:57 PM

## 2010-04-19 NOTE — Assessment & Plan Note (Signed)
Summary: Behavioral Medicine   Visit Type:  Behavioral Medicine PCP:  Enid Baas MD   History of Present Illness: Marie Harvey reported that she has not yet secured a job.  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  She says this type of work typically pays more.  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  She said that she most enjoys helping people and most dislikes cleaning up after people go to the bathroom.  She gets help moving people when that is necessary.  Marie Harvey says the tension with her mom has gotten so bad that they can't share a meal together.  Marie Harvey reiterates how critical her mother seems - commenting on her hair to her lack of a job.  Marie Harvey doesn't ever remember it being different and when asked, she does not recall a time where she felt loved by her mother.  Statements she made about her mother today include:  Her mother valued work above relationships.  Her mother and father fought all of the time.  She read a letter from her mother to a relative that her mother wrote when Marie Harvey was a child.  It made a statement about how Marie Harvey will never be a cheerleader and never be pretty.  Marie Harvey blames her mom at least in part for her being born the way she was and wonders whether there was something her mother did while Marie Harvey was in utero that caused the cleft palate and lip.  Marie Harvey finishes talking about her mother by stating, "I wish I had never born."  We discussed depressive symtpoms.  Marie Harvey links her most low days to her menstrual cycle.  She says she has low energy, depressed mood and desire to withdraw the seven days that she is actually having her period (not pre-period).  She does not remember having such mood issues when she did not have a period for two years secondary to taking depo-provera.  She is wondering when she will enter menopause and how this will effect her moods.  Marie Harvey does not wish to take a medication for  depression.  Impression & Recommendations:  Problem # 1:  DEPRESSION, MAJOR, RECURRENT (ICD-296.30) Assessment: Unchanged Marie Harvey's statement "I wish I had never been born" does not sound like "poor me."  It sounds very much like a fact.  If she had the power, she would have preferred not to have been born than to lead the life she has experienced.  She has to remember all the way back to elementary school to remember a time when she had friends.  Her first and last romantic relationship ended in 1990 (five year relationship).  She lost her only really good connection when her father died.  At that point, she really lost her connection with God as well.  She has not been able to carve out meaning in her current existence.  Several unresolved issues remain with her mother.  It makes good sense to get them both in the room but with the age of each and all that has transpired, I am not sure either one would make herself vulnerable.  I think I need to get to know Marie Harvey more before discussing that as a possibility.    Marie Harvey does not appeared depressed today.  She is energetic and has a normal range of emotion.  She does not get overly tearful, even when discussing painful life events as she did today.  I was  suprised by her candor. Orders: Therapy 40-50- min- [facility] [phone])   Patient Instructions: 1)  Follow up in Behavioral Medicine scheduled for June 30th at 11:00. 2)  Marie Harvey is to continue to work on securing a job as she thinks this is the best thing for her mental health. 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

## 2010-04-19 NOTE — Letter (Signed)
Summary: Out of Work  [facility]  [address]   [place], [place] [address]   Phone: [phone]  Fax: [phone]    November 11, 2008   Employee:  [name]    To Whom It May Concern:   Patient has chronic sinusitis that occasionally flares up.  For Medical reasons, please excuse the above named employee from work for the following dates:  Start:  November 11, 2008   End:  November 14, 2008   If you need additional information, please feel free to contact our office.         Sincerely,    Eustaquio Boyden  MD

## 2010-04-19 NOTE — Assessment & Plan Note (Signed)
Summary: cold,tcb   Vital Signs:  Patient profile:   55 year old female Height:      65.25 inches Weight:      130 pounds BMI:     21.55 BSA:     1.65 Temp:     97.9 degrees F Pulse rate:   70 / minute BP sitting:   124 / 74  Vitals Entered By: Jone Baseman CMA (Jul 19, 2009 1:48 PM) CC: cold x 5 days Is Patient Diabetic? No Pain Assessment Patient in pain? no        Primary Care Provider:  Paula Compton MD  CC:  cold x 5 days.  History of Present Illness: 1. cough/ ST/HA Became ill on Thursday. Primary symptoms are cough, sore throat and runny nose. HA yesterday. Had nausea/vomiting on Friday but this has resolved. Has taken robitussin, nyquil, and other OTC medicine which hasn't helped.  Of note, pt recently arrived back in the country Tuesday after trip to [place]. Symptoms began on Thursday.  fevers:  no   chills: no    nausea: yes    vomiting: yes    diarrhea: no     chest pain: no    shortness of breath: some SOB with exertion.      Habits & Providers  Alcohol-Tobacco-Diet     Tobacco Status: never  Current Medications (verified): 1)  Propranolol Hcl 40 Mg/2ml Soln (Propranolol Hcl) .... Sig: Take 1 Tsp By Mouth Two Times A Day For Essential Tremor Disp: Quantity Sufficient 1 Month 2)  Calcium 500/d 500-400 Mg-Unit Chew (Calcium-Vitamin D) .... Chew 1 Tab By Mouth Twice Daily 3)  Flonase 50 Mcg/act Susp (Fluticasone Propionate) .... Sig: Apply 2 Sprays To Each Nostril One Time Daily Generic Formulation Okay 4)  Ambien 10 Mg Tabs (Zolpidem Tartrate) .... Sig: Take 1/2 To 1 Tab By Mouth At Bedtime As Needed For Insomnia Generic Please.  Allergies (verified): 1)  ! Penicillin  Review of Systems       review of systems as noted in HPI section   Physical Exam  General:  vital signs reviewed and normal Alert, appropriate; well-dressed and well-nourished  Ears:  No TM erythema bilaterally. No cerumen visible. Some sclerosis Mouth:  OP erythematous; tonsils  non-enlarged; no exudate Neck:  normal ROM, no stiffness; no lymphadenopathy  Lungs:  work of breathing unlabored, clear to auscultation bilaterally; no wheezes, rales, or ronchi; good air movement throughout ; active dry cough Heart:  Regular S1S2, audible systolic M Skin:  warm, good turgor; no rashes or lesions. brisk cap refill    Impression & Recommendations:  Problem # 1:  UPPER RESPIRATORY INFECTION, VIRAL (ICD-465.9) Assessment New  supportive treatment. Return parameters discussed.  Pt to call if not some better in 1-2 days or for acute worsening. Patient agreeable. See instructions  Her updated medication list for this problem includes:    Tessalon Perles 100 Mg Caps (Benzonatate) .Marland Kitchen... Take 1-2 tabs by mouth three times a day  Orders: [facility]- Est Level  3 ([address]) [facility]- Est Level  3 ([address])  Complete Medication List: 1)  Propranolol Hcl 40 Mg/75ml Soln (Propranolol hcl) .... Sig: take 1 tsp by mouth two times a day for essential tremor disp: quantity sufficient 1 month 2)  Calcium 500/d 500-400 Mg-unit Chew (Calcium-vitamin d) .... Chew 1 tab by mouth twice daily 3)  Flonase 50 Mcg/act Susp (Fluticasone propionate) .... Sig: apply 2 sprays to each nostril one time daily generic formulation okay 4)  Ambien 10 Mg Tabs (Zolpidem tartrate) .... Sig: take 1/2 to 1 tab by mouth at bedtime as needed for insomnia generic please. 5)  Tessalon Perles 100 Mg Caps (Benzonatate) .... Take 1-2 tabs by mouth three times a day 6)  Pseudoephedrine Hcl 60 Mg Tabs (Pseudoephedrine hcl) .... One by mouth every 6 hours as needed  Patient Instructions: 1)  This is a virus. It should run its course in about 7-10 days. 2)  Take the cough medicine two times a day; it may make you sleepy. 3)  Take pseudoephedrine to help with congestion and headache.  4)  Some times gargling with saltwater can help your throat. Also warm liquids are helpful 5)  Make sure you're drinking plenty of fluids. Your urine  should run clear. 6)  Call if you feel worse suddenly or if not some better in the next 7 days.  Prescriptions: PSEUDOEPHEDRINE HCL 60 MG TABS (PSEUDOEPHEDRINE HCL) one by mouth every 6 hours as needed  #30 x 0   Entered and Authorized by:   Myrtie Soman  MD   Signed by:   Myrtie Soman  MD on 07/19/2009   Method used:   Electronically to        [facility] [phone]* (retail)       [address]       [place], [place]  [address]       Ph: [phone]       Fax: [phone]   RxID:   [id number] TESSALON PERLES 100 MG CAPS (BENZONATATE) take 1-2 tabs by mouth three times a day  #30 x 0   Entered and Authorized by:   Myrtie Soman  MD   Signed by:   Myrtie Soman  MD on 07/19/2009   Method used:   Electronically to        [facility] [phone]* (retail)       [address]       [place], [place]  [address]       Ph: [phone]       Fax: [phone]   RxID:   [id number]

## 2010-04-19 NOTE — Miscellaneous (Signed)
Summary: ROI  ROI   Imported By: ERIN LEVAN 04/08/2007 10:16:51  _____________________________________________________________________  External Attachment:    Type:   Image     Comment:   External Document

## 2010-04-19 NOTE — Assessment & Plan Note (Signed)
Summary: GI REFERRAL  APPT WITH DR MANN (GI) [address] [phone] TUES MAY 20TH AT 10:30 APPT MAILED TO PATIENT

## 2010-04-19 NOTE — Letter (Signed)
Summary: [facility]  [facility]   Imported By: Eather Colas PATE CMA, 12/05/2007 11:26:40  _____________________________________________________________________  External Attachment:    Type:   Image     Comment:   External Document

## 2010-04-19 NOTE — Letter (Signed)
Summary: Out of Work  [facility]  [address]   [place], [place] [address]   Phone: [phone]  Fax: [phone]    Jul 21, 2009   Employee:  [name]    To Whom It May Concern:   For Medical reasons, please excuse the above named employee from work for the following dates:  Start: Please excuse from work for Saturday 07/24/09     If you need additional information, please feel free to contact our office.         Sincerely,    Doralee Albino MD

## 2010-04-19 NOTE — Miscellaneous (Signed)
Summary: Orders Update  Clinical Lists Changes  Orders: Added new Service order of No Charge Patient Arrived (NCPA0) (NCPA0) - Signed 

## 2010-04-19 NOTE — Assessment & Plan Note (Signed)
Summary: ear pain wp   Vital Signs:  Patient Profile:   55 Years Old Female Height:     65.25 inches Weight:      120.4 pounds BMI:     19.95 Temp:     97.5 degrees F oral Pulse rate:   82 / minute BP sitting:   135 / 80  (left arm) Cuff size:   regular  Vitals Entered By: Garen Grams LPN (January 07, 2008 9:55 AM)                 PCP:  Enid Baas MD  Chief Complaint:  left ear pain and pounding.  History of Present Illness: Patient seen today for c/o L ear "pounding" sensation with background sound of static, since Oct 11th.  She remarked that the symptom began shortly after getting the flu vaccine.  No fevers or chills, no otorrhea.  She has not had similar sxs before.   She routinely plugs her nose and breathes out to open her Eustachian tubes, may have noted this following this practice.  PMHx: several ear infections in the past, was considered a candidate for tubes but did not get.   She recently had esophageal stricture dilated in [facility], note reviewed in [facility]. Is better able to swallow pills now.         Physical Exam  General:     generally well appearing, nAD Eyes:     vision grossly intact, pupils equal, pupils round, and pupils reactive to light.   Ears:     R ear normal and no external deformities.  Hearing grossly intact bilaterally. Tympanometry shows normal peak on R; the L tympanometry is a gradual curve that peaks in lower frequencies. Upon exam of the L TM, no erythema noted. Anatomy is distorted, with the malleus protruding notably.  I am unable to tell if there is a rupture in the integrity of the TM.  No liquid seen.  No pain to manipulate tragus or pinna on either side.  No periauricular adenopathy, no cervical nodes noted.     Impression & Recommendations:  Problem # 1:  CENTRAL PERFORATION OF TYMPANIC MEMBRANE (ICD-384.21) Patient with acute L ear tinnitus, abnormal ear anatomy.  I am not convinced that she does not have  a ruptured tm.  Also to consider cholesteatoma, although less likely.  Orders: ENT Referral (ENT) [facility]- Est Level  3 ([address])     ]

## 2010-04-19 NOTE — Assessment & Plan Note (Signed)
Summary: N&V d/t sinusitis/KF   Vital Signs:  Patient profile:   55 year old female Height:      65.25 inches Weight:      127.19 pounds BMI:     21.08 Temp:     98.0 degrees F oral Pulse rate:   65 / minute Pulse rhythm:   regular BP sitting:   127 / 81  (right arm)  Vitals Entered By: Modesta Messing LPN (November 11, 2008 8:49 AM) CC: Work in visit for sinisitis. Is Patient Diabetic? No Pain Assessment Patient in pain? no        Primary Care Provider:  Paula Compton MD  CC:  Work in visit for sinisitis.Marland Kitchen  History of Present Illness: CC: ? sinusitis  Pt with recent travel to [place], returned 8/16.  Since then has had increasing nasal congestion, postnasal drip, and headaches.  pt states she has chronic sinusitis, and feels she is having an exacerbation of this.  States normally has flares with rain.  Has lasted so far for about 9 days.  No fevers, but patient says she rarely runs a fever.  No diarrhea, constipation, vision changse, no current headaches.  Endorses nausea this morning and mild cough.  Stayed home from work and would like work excuse.  Says peptobismol helps.  Habits & Providers  Alcohol-Tobacco-Diet     Tobacco Status: never  Current Medications (verified): 1)  Propranolol Hcl 40 Mg/47ml Soln (Propranolol Hcl) .... Sig: Take 1 Tsp By Mouth Two Times A Day For Essential Tremor Disp: Quantity Sufficient 1 Month 2)  Vitamin D 69629 Unit Caps (Ergocalciferol) .... Sig: Take 1 Capsule By Mouth One Time Weekly For Six Weeks  Allergies (verified): 1)  ! Penicillin  Past History:  Past Medical History: Last updated: 06-02-06 coarctation of pulmonary artery  facial reconstructive surgery  h Pylori -neg  pneumonia  tetralogy of Fallot SBE porphylaxis - high risk give 2 gms keflex 1 hr prior to dental procedures  Past Surgical History: Last updated: 05/17/2006 reapir of tetralogy of fallot - [place] - 04/10/2000  Family History: Last updated:  06/02/2006 father died 70/ cva/ cad/ ?etoh/ tobacco  mother endometrial ca, DJD, very healthy at 57  Social History: Last updated: 05/17/2006 non smoker/ no etoh; difficulty keeping jobs; lives with mother/ never married  Physical Exam  General:  Well-developed,well-nourished,in no acute distress; alert,appropriate and cooperative throughout examination Head:  Normocephalic and atraumatic without obvious abnormalities. No apparent alopecia or balding.  no sinus tenderness Eyes:  vision grossly intact, pupils equal, pupils round, and pupils reactive to light.   Ears:  External ear exam shows no significant lesions or deformities.  Otoscopic examination reveals clear canals, tympanic membranes are intact bilaterally without bulging, retraction, inflammation or discharge. Hearing is grossly normal bilaterally. Mouth:  repaired cleft lip.  MMM, no pharyngeal erythema/edema Neck:  no LAD Lungs:  Normal respiratory effort, chest expands symmetrically. Lungs are clear to auscultation, no crackles or wheezes. Heart:  systolic Ejction M alon LSB and short diastolic M Abdomen:  Bowel sounds positive,abdomen soft and non-tender without masses, organomegaly or hernias noted. Pulses:  palpable radial pulses bilaterally.    Impression & Recommendations:  Problem # 1:  SINUSITIS, CHRONIC, NOS (ICD-473.9) ? flare of chronic sinusitis.  Less concern for acute sinusitis as no fever, no maxillary tenderness, no LAD.  ? viral URI.  Symptomatic support.  Red flags to return for consideration of antibiotics.  offered nausea medicine, pt declines.  Seems like she just  wants work excuse as she's missed work this morning due to nausea. Orders: [facility]- Est Level  3 ([address])  Complete Medication List: 1)  Propranolol Hcl 40 Mg/77ml Soln (Propranolol hcl) .... Sig: take 1 tsp by mouth two times a day for essential tremor disp: quantity sufficient 1 month 2)  Vitamin D 60454 Unit Caps (Ergocalciferol) .... Sig: take 1  capsule by mouth one time weekly for six weeks  Patient Instructions: 1)  Please return as needed, or if fevers or worsening return sooner. 2)  I think you likely have an upper airway infection.  You should start feeling better by this weekend.  Make sure you drink plenty of fluid and get plenty of rest. 3)  Call clinic with questions.  PLeasure to meet you!

## 2010-04-19 NOTE — Assessment & Plan Note (Signed)
Summary: R ear x 3 wk/   Vital Signs:  Patient profile:   55 year old female Height:      65.25 inches Weight:      131 pounds BMI:     21.71 Pulse rate:   76 / minute BP sitting:   124 / 80  (right arm)  Vitals Entered By: Arlyss Repress CMA, (April 23, 2009 1:31 PM) CC: headache off and on x 2-3 weeks. check right ear. per pt 'ear is humming' x 1 week Is Patient Diabetic? No Pain Assessment Patient in pain? no        Primary Care Provider:  Paula Compton MD  CC:  headache off and on x 2-3 weeks. check right ear. per pt 'ear is humming' x 1 week.  History of Present Illness: Marie Harvey comes in today for a complaint of R sided humming tinnitus, not associated wtih pain or change in hearing acuity by her subjective report.  Denies discharge from either ear.  Has noticed this getting worse over the past 2 weeks.  Has used Debrox for wax removal on two occasions, no improvement.  Denies fevers or chills.  Has had mild cough with small amt phlegm production.   Habits & Providers  Alcohol-Tobacco-Diet     Tobacco Status: never  Current Medications (verified): 1)  Propranolol Hcl 40 Mg/66ml Soln (Propranolol Hcl) .... Sig: Take 1 Tsp By Mouth Two Times A Day For Essential Tremor Disp: Quantity Sufficient 1 Month 2)  Calcium 500/d 500-400 Mg-Unit Chew (Calcium-Vitamin D) .... Chew 1 Tab By Mouth Twice Daily  Allergies (verified): 1)  ! Penicillin  Physical Exam  General:  well appearing, no apparent distress Eyes:  Clear sclerae.  PERRL.  EOMI Ears:  L TM clear with good cone of light.  R TM obscured by large amount impacted cerumen. EAC clean without maceration.  No periauricular adenopathy Nose:  clear. No maxillary sinus tenderness or frontal tenderness Mouth:  Clear oropharynx. Neck:  Neck supple. No anterior cervical adenopathy  Lungs:  Normal respiratory effort, chest expands symmetrically. Lungs are clear to auscultation, no crackles or wheezes.   Impression &  Recommendations:  Problem # 1:  CERUMEN IMPACTION, RIGHT (ICD-380.4)  Marie Harvey has apparent cerumen impaction, associated with humming tinnitus in the R ear only.  WIll irrigate for cerumen removal today.  Her ENT is Dr Lazarus Salines.  Orders: [facility]- Est Level  3 ([address])  Complete Medication List: 1)  Propranolol Hcl 40 Mg/14ml Soln (Propranolol hcl) .... Sig: take 1 tsp by mouth two times a day for essential tremor disp: quantity sufficient 1 month 2)  Calcium 500/d 500-400 Mg-unit Chew (Calcium-vitamin d) .... Chew 1 tab by mouth twice daily  Patient Instructions: 1)  It was a pleasure to see you in the office today  I believe the ringing in your right ear is related to the large amount of wax in the ear. Today we irrigated the right ear in order to remove the wax.  2)  You may want to use the Debrox drops, or a small amount of hydrogen peroxide, in the right ear from time to time    Prevention & Chronic Care Immunizations   Influenza vaccine: Fluvax Non-MCR  (12/22/2008)   Influenza vaccine due: 12/20/2007    Tetanus booster: 10/16/2007: given   Tetanus booster due: 10/15/2017    Pneumococcal vaccine: Done.  (02/17/1997)   Pneumococcal vaccine due: None  Colorectal Screening   Hemoccult: Not documented    Colonoscopy:  Not documented  Other Screening   Pap smear: Not documented    Mammogram: ASSESSMENT: Negative - BI-RADS 1^MM DIGITAL SCREENING  (08/26/2008)   Smoking status: never  (04/23/2009)  Lipids   Total Cholesterol: 194  (11/07/2007)   LDL: 116  (11/07/2007)   LDL Direct: Not documented   HDL: 65  (11/07/2007)   Triglycerides: 63  (11/07/2007)

## 2010-04-19 NOTE — Assessment & Plan Note (Signed)
Summary: discuss shaking/wp   Vital Signs:  Patient Profile:   55 Years Old Female Height:     65.25 inches Weight:      122.7 pounds Temp:     97.5 degrees F Pulse rate:   76 / minute BP sitting:   130 / 94  (left arm)  Pt. in pain?   no  Vitals Entered By: Theresia Lo RN (February 25, 2008 8:37 AM)                  Serial Vital Signs/Assessments:  Time      Position  BP       Pulse  Resp  Temp     By                     122/80                         Paula Compton MD                     132/88                         Paula Compton MD  Comments: left arm manual By: Paula Compton MD    PCP:  Enid Baas MD  Chief Complaint:  shaking, wants med for nerves, and hx osteoporosis and wants Boniva.  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  She has noticed the tremor when attempting to draw blood from her classmates in September; never has been a problem to her before.  She is right-hand dominant.   Remarks that she drinks a lot of caffeinated Coca-Cola, no coffee.  No noted weakness. no problems wiht writing or performing other manual dexterous functions.   Family Hx notable for absence of Parkinsonism in first-degree relatives; one of her grandparents had a "tremor problem" but unsureof diagnosis.   Also today [place] wishes to discuss her previous diagnosis of osteoporosis.  She had DEXA on June 12, 2007 with hip T score of -2.7.  SInce then she has decided she would like to take Boniva.    Of note, patient has had esophageal problems (?strictures) for which she has undergone surgery with Dr. Kandee Keen at [facility] in [place].  She is returning there later this week for an additional evaluation byt Dr Madilyn Hook.   Finally, Jobe Igo notes that her ears no longer pop or ring since starting the nasal steroid spray prescribed by Dr Lazarus Salines       Current Allergies: ! PENICILLIN      Physical Exam  General:     Well-developed,well-nourished,in no acute distress; alert,appropriate and cooperative throughout examination Nose:     TMs clear bilaterally without retraction as seen in prior exam.  Neck:     supple thyroid Msk:     No deformities or atrophies of hands or arms, symmetric andfully developed.  Full strength and active ROM of hands, handgrip, wrists, elbows and shoulders.  No cogwheeling at elbows or wrists by my exam. Pulses:     palpable radial pulses bilaterally.  Neurologic:     Gait without observed limitation.  Finger to nose with slight intention tremor with extension bilaterally, but able to touch moving target without difficulty. Romberg unremarkable without pronator drift. No tremor  of lower extremities.     Impression & Recommendations:  Problem # 1:  ESSENTIAL AND OTHER SPECIFIED FORMS OF TREMOR (ICD-333.1) Patient who has noticed mild tremor with phlebotomy in the past 4 months, no cowheeling on exam, no significant family history of Parkinsonism.  I believe this is most likely benign essential tremor, will defer neuroimaging at this time. Labs as ordered. Will begin wiht treatment wiht propranolol low-dose and she will tell me if this is helpful.  Orders: Basic Met-[facility] [id number]) CBC w/Diff-[facility] (34742) TSH-[facility] ([id number]) [facility]- Est  Level 4 ([address])   Problem # 2:  IDIOPATHIC OSTEOPOROSIS (ICD-733.02) In light of esophageal issues, would defer a recommendation for by mouth bisphosphonates until more definition of her problem by GI.  I have given her a note to present to Dr Madilyn Hook requesting her opinion about whether it is advisable to pursue oral bisphosphonates with Jobe Igo.  I have discussed the use of IV bisphosphonates, which she decilnes.  Discussed Calcium and Vitamin D supplementation at the very least, as well as weight bearing exercise.  Orders: Basic Met-[facility] [phone]) CBC  w/Diff-[facility] [id number]) TSH-[facility] [id number]) Vit D, 25 [place]-[facility] ([id number]) [facility]- Est  Level 4 ([address])   Complete Medication List: 1)  Propranolol Hcl 40 Mg/1ml Soln (Propranolol hcl) .... Sig: take 1 tsp by mouth two times a day for essential tremor disp: quantity sufficient 1 month   Patient Instructions: 1)  1) Be sure to take calcium supplement with vitamin D (1500mg  calcium, 800IU vitamin D) daily. 2)  2) Bring my letter to Dr Madilyn Hook for her thoughts on using Boniva for osteoporosis, in light of your esophageal issues. 3)  3) Use the propranolol 40mg  twice daily for the essential tremor.  Let me know if this is not working well.   Stop drinking as much Cola as you currently do (or use decaf).   Prescriptions: PROPRANOLOL HCL 40 MG/5ML SOLN (PROPRANOLOL HCL) SIG: Take 1 tsp by mouth two times a day for essential tremor DISP: Quantity sufficient 1 month  #1 x 5   Entered and Authorized by:   Paula Compton MD   Signed by:   Paula Compton MD on 02/25/2008   Method used:   Electronically to        [facility] # [phone]* (retail)       [address] [place], [place]  [address]       Ph: [phone]       Fax: [phone]   RxID:   [id number]  ]

## 2010-04-19 NOTE — Letter (Signed)
Summary: Results Follow-up Letter  [facility]  [address]   [place], [place] [address]   Phone: [phone]  Fax: [phone]    08/06/2007  [address] [place], [place]  [address]  Dear Ms. Marie Harvey,   The following are the results of your recent test(s):  Test     Result     Mammogram    Normal____X___  Not Normal_____       Comments: _________________________________________________________ Cholesterol LDL(Bad cholesterol):          Your goal is less than:         HDL (Good cholesterol):        Your goal is more than: _________________________________________________________ Other Tests:   _________________________________________________________  Please call for an appointment Or _________________________________________________________ _________________________________________________________ _________________________________________________________  Sincerely,  Juluis Rainier MD [facility]

## 2010-04-19 NOTE — Assessment & Plan Note (Signed)
Summary: OFFICE VISIT/  DPG   Vital Signs:  Patient Profile:   55 Years Old Female Height:     65.25 inches Weight:      118.7 pounds BMI:     19.67 Pulse rate:   80 / minute BP sitting:   120 / 78  (right arm)  Pt. in pain?   no  Vitals Entered By: Arlyss Repress CMA, (November 07, 2007 10:41 AM)              Is Patient Diabetic? 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   Head:     Normocephalic and atraumatic without obvious abnormalities. No apparent alopecia or balding. Nose:     changes in septum post surg Mouth:     repaired cleft lip Lungs:     Normal respiratory effort, chest expands symmetrically. Lungs are clear to auscultation, no crackles or wheezes. Heart:     systolic Ejction M alon LSB and short diastolic M Msk:     Marked scoliosis at neck not painful but limits ROM or at least alters some mild thoracic and lumbar change  RT knee exam shows no effusion; stable ligaments; negative Mcmurray's and provocative meniscal tests; non painful patellar  compression; patellar and quadriceps tendons unremarkable.  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

## 2010-04-19 NOTE — Progress Notes (Signed)
Summary: needs note  Phone Note Call from Patient Call back at [phone]   Caller: Patient Summary of Call: needs a note stating that she no longer needs to take an antibiotic before dental treatments.  she has dental appt on 9/21 and they will not see her w/o that note. Initial call taken by: De Nurse,  November 30, 2009 8:40 AM  Follow-up for Phone Call        called pt lmom to return call. Follow-up by: Tessie Fass CMA,  December 01, 2009 10:46 AM  Additional Follow-up for Phone Call Additional follow up Details #1::        spoke with pt, says she will pick letter up tomorrow at front desk. Additional Follow-up by: Tessie Fass CMA,  December 01, 2009 2:25 PM    Letter written.  Please communicate this to her. Paula Compton MD  December 01, 2009 8:38 AM

## 2010-04-19 NOTE — Consult Note (Signed)
Summary: [facility]   [facility]   Imported By: Bradly Bienenstock 09/25/2006 13:55:32  _____________________________________________________________________  External Attachment:    Type:   Image     Comment:   External Document

## 2010-04-19 NOTE — Assessment & Plan Note (Signed)
Summary: routine visit/el   Vital Signs:  Patient Profile:   55 Years Old Female Height:     65.25 inches Weight:      111.7 pounds BMI:     18.51 Pulse rate:   83 / minute BP sitting:   131 / 81  (right arm)  Pt. in pain?   no  Vitals Entered By: Arlyss Repress CMA, (May 30, 2007 8:38 AM)              Is Patient Diabetic? No     PCP:  Enid Baas MD  Chief Complaint:  discuss colonoscopy/endoscopy. ?exercise?Marland Kitchen  History of Present Illness: Patient with hx suggestive of Plummer Vinson syndrome we began workup to get her endoscopy for her dysphagia also screening colonsocopy with her hx of anemia This has been delayed but now it appears [facility] will allow her to see GI specialist in [place] Referral letter to him done today Dysphagia sxs are unchanged  Concern by her mother as patient has gained some weight - 3 to 4 LBS!! Always to thin eationg ice cream and snack foods as a lot of regular foods difficult to swallow mother concern is that years ago she gained weight and it was a sign of CHF after her surgery for tet of Fallot  second concern is borderline BP this may have increased some with menopausal sxs No MP since sept 08 eats a lot of salt BP cks mult times  ~ 130 to 135 syst no regular exercise        Physical Exam  General:     underweight appearing.   has repaired lip and palate alert NAD good eye contact not depressed appearing today Head:     Normocephalic and atraumatic without obvious abnormalities. No apparent alopecia or balding. Neck:     no jvd Lungs:     Normal respiratory effort, chest expands symmetrically. Lungs are clear to auscultation, no crackles or wheezes. Heart:     Grade 2 Systolic M at base and along LSB regular rhythm no S3 midline scar from surgery Abdomen:     no hepatomegaly and no splenomegaly.      Impression & Recommendations:  Problem # 1:  DYSPHAGIA (ICD-787.2) Assessment: Unchanged I strongly  encouraged her to get the workup completed with both endoscopy and colonscopy  Problem # 2:  CONGENITAL HEART DISEASE, HX OF (ICD-V12.59) reassured mother that there is no sign of CHF This is very stable I think her weight gain is good BMI is only 18.5! Orders: [facility]- Est Level  3 (99213)   Problem # 3:  SYMPTOMATIC MENOPAUSAL/FEMALE CLIMACTERIC STATES (ICD-627.2) Follow BP clearly having sxs with hot flashed now BP may be intermittent still helpful to improve diet and exercise pattern Orders: [facility]- Est Level  3 (16109)      ]

## 2010-04-19 NOTE — Assessment & Plan Note (Signed)
Summary: tb test/fields/wp  Nurse Visit       PPD Application    Vaccine Type: PPD    Site: left forearm    Mfr: Sanofi Pasteur    Dose: 0.1 ml    Route: ID    Given by: ADINA GOULD    Exp. Date: 01/24/2010    Lot #: [id number] PT instructed to return on 10/27/07.Bishop Limbo GOULD  October 22, 2007 2:38 PM   Orders Added: 1)  TB Skin Test Maurine.Goes    ]

## 2010-04-19 NOTE — Progress Notes (Signed)
Summary: shingles vaccine  Phone Note Call from Patient Call back at Home Phone [phone]   Reason for Call: Talk to Nurse Summary of Call: pts mother called requesting a shingles shot for pt, mother is coming in on Friday and wanted to schedule both, does this pt need to be seen by md before we schedule? We had enough for mother, do we have enough for this pt? Initial call taken by: ERIN LEVAN,  December 26, 2006 4:34 PM  Follow-up for Phone Call        called pt. LMAM shingles vac for pt's age 1 and older.  Follow-up by: Arlyss Repress CMA,,  December 27, 2006 10:57 AM

## 2010-04-19 NOTE — Consult Note (Signed)
[facility]   Imported By: De Nurse 06/28/2009 15:27:02  _____________________________________________________________________  External Attachment:    Type:   Image     Comment:   External Document

## 2010-04-19 NOTE — Consult Note (Signed)
[facility]   Imported By: Haydee Salter 05/01/2008 14:32:15  _____________________________________________________________________  External Attachment:    Type:   Image     Comment:   External Document

## 2010-04-19 NOTE — Assessment & Plan Note (Signed)
Summary: ear still bothering her,df   Vital Signs:  Patient profile:   55 year old female Weight:      130.6 pounds Temp:     98 degrees F oral Pulse rate:   72 / minute BP sitting:   118 / 70  (right arm)  Vitals Entered By: Arlyss Repress CMA, (April 30, 2009 3:07 PM) CC: f/up right ear. pt states 'ear still humming'. Is Patient Diabetic? No Pain Assessment Patient in pain? no        Primary Care Provider:  Paula Compton MD  CC:  f/up right ear. pt states 'ear still humming'..  History of Present Illness: Marie Harvey is here today for followup of a fullness and humming sensatiokn in the R ear.  Not painful, no otorrhea.  Was seen last week, and had copious cerumen removed from [facility] ear.  She reports that her symptoms resolved completely for about two days, then returned again by the end of last weekend.   Denies cough, chills or fever.      Habits & Providers  Alcohol-Tobacco-Diet     Tobacco Status: never  Allergies: 1)  ! Penicillin  Physical Exam  General:  Well-developed,well-nourished,in no acute distress; alert,appropriate and cooperative throughout examination Ears:  TMs clear andwith goodcones of light. No fluid or opacity. Nose:  Nasal turbinates and mucosa unremarkable. Mouth:  Cleft palate repair. Oropharynx unremarkable. Neck:  neck supple. No anterior cervical adenopathy.    Impression & Recommendations:  Problem # 1:  ALLERGIC RHINITIS (ICD-477.9)  Marie Harvey has suffered from this in the past, most recently in Fall 2009 when she was last seenby her ENT, Dr Lazarus Salines.  Will attempt to address with nasal steroid treatment to be used regularly.  SHe is to call if this is not giving her improvement in the next two to three weeks.   Her updated medication list for this problem includes:    Flonase 50 Mcg/act Susp (Fluticasone propionate) ..... Sig: apply 2 sprays to each nostril one time daily generic formulation okay  Orders: [facility]- Est Level  3  (86578)  Complete Medication List: 1)  Propranolol Hcl 40 Mg/59ml Soln (Propranolol hcl) .... Sig: take 1 tsp by mouth two times a day for essential tremor disp: quantity sufficient 1 month 2)  Calcium 500/d 500-400 Mg-unit Chew (Calcium-vitamin d) .... Chew 1 tab by mouth twice daily 3)  Flonase 50 Mcg/act Susp (Fluticasone propionate) .... Sig: apply 2 sprays to each nostril one time daily generic formulation okay  Patient Instructions: 1)  It was a pleasure to see you again today.  2)  I recommend regular daily use of FLonase nasal spray, two sprays/nostril once daily.  The prescription for this was sent to [facility] on [address] in [place]. 3)  If this is not any better in the coming 1 to 2 weeks, we may want to seek the help of your ENT< Dr Lazarus Salines. Prescriptions: FLONASE 50 MCG/ACT SUSP (FLUTICASONE PROPIONATE) SIG: Apply 2 sprays to each nostril one time daily Generic formulation okay  #1 x 6   Entered and Authorized by:   Paula Compton MD   Signed by:   Paula Compton MD on 04/30/2009   Method used:   Electronically to        [facility] [phone]* (retail)       [address]       [place], [place]  [address]       Ph: [phone]  Fax: [phone]   RxID:   [id number]

## 2010-04-19 NOTE — Miscellaneous (Signed)
  Clinical Lists Changes  Problems: Removed problem of DM W/COMPLICATION NOS, TYPE II, UNCONTROLLED (ICD-250.92)

## 2010-04-19 NOTE — Assessment & Plan Note (Signed)
Summary: TB TEST/FIELDS/KH  Nurse Visit   Patient Instructions: 1)  RTC IN 2-3 DAYS FOR PPD READING       PPD Application    Vaccine Type: PPD    Site: left forearm    Mfr: [employer] Pasteur    Dose: 0.1 ml    Route: ID    Given by: Lillia Pauls CMA    Exp. Date: 09/22/2008    Lot #: [id number]   Orders Added: 1)  Est Level 1- [facility] [45409] 2)  TB Skin Test [phone]

## 2010-04-21 NOTE — Assessment & Plan Note (Signed)
Summary: f/u,df   Vital Signs:  Patient profile:   55 year old female Height:      65.25 inches Weight:      137.9 pounds BMI:     22.85 Pulse rate:   90 / minute BP sitting:   140 / 80  (right arm)  Vitals Entered By: Arlyss Repress CMA, (April 15, 2010 1:48 PM) CC: regular check up. no complaints. Is Patient Diabetic? No Pain Assessment Patient in pain? no        Primary Care Provider:  Paula Compton MD  CC:  regular check up. no complaints..  History of Present Illness: Marie Harvey is doing well; comes in today to discuss various topics.  First off, she mentions her weight gain of around 10lbs since last visit.  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  Is fairly unstructured presently, not eating or sleeping on schedule.  Lots of trouble initiating sleep.  Not taking AMbien.  Would like to sleep at 10pm, but finds herselfreading late, drinking milk and eating sandwiches late at night.  Thinking negative thoughts about her unemployment.   Denies chest pain, cough, fevers, swelling, abd pain or diarrhea/constipation.  LAst PAP done 2008 by a Gyn, prefers Gyn for this.  Reviewed results of mammogram in 2011, normal.  States she had screening colonoscopy in 2009 with Dr Noe Gens in [place], told was negative.   PHQ9 today: scores 10 (zero pts for #9).  3 pts for sleep and eating questions (#3,5); 2 pts #6", 1 pt each for #2,4.  Reports she feels bad, mostly when in conflict with her mother, whom she states blames her for many things.   Habits & Providers  Alcohol-Tobacco-Diet     Tobacco Status: never  Current Medications (verified): 1)  Calcium 500/d 500-400 Mg-Unit Chew (Calcium-Vitamin D) .... Chew 1 Tab By Mouth Twice Daily 2)  Flonase 50 Mcg/act Susp (Fluticasone Propionate) .... Sig: Apply 2 Sprays To Each Nostril One Time Daily Generic Formulation Okay 3)  Propranolol Hcl 40 Mg Tabs (Propranolol Hcl) .Marland Kitchen.. 1 By Mouth Once Daily As  Directed 4)  Nortriptyline Hcl 25 Mg Caps (Nortriptyline Hcl) .Marland Kitchen.. 1 Cap By Mouth At Bedtime  Allergies (verified): 1)  ! Penicillin 2)  Codeine 3)  * Cannot Take Gel Caps Due To Swallowing Problems  Family History: father died 70/ cva/ cad/ ?etoh/ tobacco  mother endometrial ca, DJD, very healthy at 46  April 15, 2010: Reviewed, no changes.   Social History: non smoker/ no etoh; difficulty keeping jobs; lives with mother/ never married  April 15, 2010: Doing some part-time work, looking for full time job.  Few social contacts.  Never a smoker, does not drink alcohol.  Lives with mother.    Physical Exam  General:  Alert, animated, no apparent distress.  Eyes:  No corneal or conjunctival inflammation noted. EOMI. Perrla. Funduscopic exam benign, without hemorrhages, exudates or papilledema. Vision grossly normal. Ears:  clear TMs without erythema Mouth:  moist mucus membranes.  Surgical scarring oropharynx/soft palate. Neck:  no adenopathy. Neck supple Lungs:  Normal respiratory effort, chest expands symmetrically. Lungs are clear to auscultation, no crackles or wheezes. Heart:  S1S2, machinelike murmur heard LSB   Impression & Recommendations:  Problem # 1:  ABNORMAL WEIGHT GAIN (ICD-783.1)  Unstructured eating, versus result of stress-eating/depression/anxiety.  Discussed increased activity when weather is nice, reserving foods to mealtimes.    Orders: [facility]- Est  Level 4 (  47829)  Problem # 2:  INSOMNIA UNSPECIFIED (ICD-780.52)  Discussed role of structured activity during the day; keeping same sleep time; bedroom reserved for sleeping (not reading, TV, etc).  She does not have a TV in her room.  Will stop Ambien (not taking), and initiate Nortriptyline 25mg  at bedtime for sleep.  May consider SSRI such as citalopram at some point, versus increasing dose of TCA pending tolerance.  At risk of depression (PHQ9 score is 10 today).  Discussed ways to increase social activity  (goes to [employer] her [employer] monthly, but does not see others in social setting outside of this. The following medications were removed from the medication list:    Ambien 10 Mg Tabs (Zolpidem tartrate) ..... Sig: take 1/2 to 1 tab by mouth at bedtime as needed for insomnia generic please.  Orders: [facility]- Est  Level 4 (56213)  Problem # 3:  ALLERGIC RHINITIS (ICD-477.9)  Refill on Flonase.  Worse when weather changes.  The following medications were removed from the medication list:    Pseudoephedrine Hcl 60 Mg Tabs (Pseudoephedrine hcl) ..... One by mouth every 6 hours as needed Her updated medication list for this problem includes:    Flonase 50 Mcg/act Susp (Fluticasone propionate) ..... Sig: apply 2 sprays to each nostril one time daily generic formulation okay  Orders: [facility]- Est  Level 4 (99214)  Complete Medication List: 1)  Calcium 500/d 500-400 Mg-unit Chew (Calcium-vitamin d) .... Chew 1 tab by mouth twice daily 2)  Flonase 50 Mcg/act Susp (Fluticasone propionate) .... Sig: apply 2 sprays to each nostril one time daily generic formulation okay 3)  Propranolol Hcl 40 Mg Tabs (Propranolol hcl) .Marland Kitchen.. 1 by mouth once daily as directed 4)  Nortriptyline Hcl 25 Mg Caps (Nortriptyline hcl) .Marland Kitchen.. 1 cap by mouth at bedtime  Patient Instructions: 1)  It was a pleasure to see you today.  We changed your propranolol to 40mg  tablets, to take one tablet daily as needed.  I refilled the Flonase as well.   I sent the prescriptions to the [facility] on [address] in [place].  2)  As we discussed, I am prescribing Nortriptyline 25mg , to take one capsule at bedtime, to help with sleep. 3)  I recommend you take the Viactiv Plus D as we discussed.   Also, you are overdue for a PAP smear.  We may either do this in our office, or you may schedule with the GYN for this.  4)  I would like to see you back briefly in the coming 1 month to see how you are doing. Prescriptions: NORTRIPTYLINE HCL 25 MG CAPS  (NORTRIPTYLINE HCL) 1 cap by mouth at bedtime  #30 x 4   Entered and Authorized by:   Paula Compton MD   Signed by:   Paula Compton MD on 04/15/2010   Method used:   Electronically to        [facility] [phone]* (retail)       [address]       [place], [place]  [address]       Ph: [phone]       Fax: [phone]   RxID:   [id number] PROPRANOLOL HCL 40 MG TABS (PROPRANOLOL HCL) 1 by mouth once daily as directed  #30 x 11   Entered and Authorized by:   Paula Compton MD   Signed by:   Paula Compton MD on 04/15/2010   Method used:   Electronically to  [facility]  [address] [phone]* (retail)       [address]       [place], [place]  [address]       Ph: [phone]       Fax: [phone]   RxID:   [id number] FLONASE 50 MCG/ACT SUSP (FLUTICASONE PROPIONATE) SIG: Apply 2 sprays to each nostril one time daily Generic formulation okay  #1 x 11   Entered and Authorized by:   Paula Compton MD   Signed by:   Paula Compton MD on 04/15/2010   Method used:   Electronically to        [facility] [phone]* (retail)       [address]       [place], [place]  [address]       Ph: [phone]       Fax: [phone]   RxID:   [id number]    Orders Added: 1)  [facility]- Est  Level 4 [32355]     Prevention & Chronic Care Immunizations   Influenza vaccine: Fluvax Non-MCR  (12/22/2009)   Influenza vaccine due: 12/20/2007    Tetanus booster: 10/16/2007: given   Tetanus booster due: 10/15/2017    Pneumococcal vaccine: Done.  (02/17/1997)   Pneumococcal vaccine due: None  Colorectal Screening   Hemoccult: Not documented    Colonoscopy: Not documented  Other Screening   Pap smear: Not documented    Mammogram: ASSESSMENT: Negative - BI-RADS 1^MM DIGITAL SCREENING  (08/26/2009)   Smoking status: never  (04/15/2010)  Lipids   Total Cholesterol: 194  (11/07/2007)   LDL: 116  (11/07/2007)   LDL Direct: Not documented   HDL: 65  (11/07/2007)   Triglycerides: 63  (11/07/2007)

## 2010-06-05 ENCOUNTER — Telehealth: Payer: Self-pay | Admitting: Sports Medicine

## 2010-06-05 NOTE — Telephone Encounter (Signed)
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  No problems breathing, swallowing.  Advised [facility] SDA Monday.

## 2010-06-14 ENCOUNTER — Encounter: Payer: Self-pay | Admitting: Family Medicine

## 2010-06-14 ENCOUNTER — Ambulatory Visit (INDEPENDENT_AMBULATORY_CARE_PROVIDER_SITE_OTHER): Payer: BC Managed Care – PPO | Admitting: Family Medicine

## 2010-06-14 VITALS — BP 120/76 | Temp 97.6°F | Ht 66.0 in | Wt 137.0 lb

## 2010-06-14 DIAGNOSIS — G252 Other specified forms of tremor: Secondary | ICD-10-CM

## 2010-06-14 DIAGNOSIS — J309 Allergic rhinitis, unspecified: Secondary | ICD-10-CM

## 2010-06-14 DIAGNOSIS — F339 Major depressive disorder, recurrent, unspecified: Secondary | ICD-10-CM

## 2010-06-14 DIAGNOSIS — M7710 Lateral epicondylitis, unspecified elbow: Secondary | ICD-10-CM | POA: Insufficient documentation

## 2010-06-14 DIAGNOSIS — G250 Essential tremor: Secondary | ICD-10-CM

## 2010-06-14 MED ORDER — PROPRANOLOL HCL 40 MG PO TABS: 40.0000 mg | ORAL_TABLET | Freq: Every day | ORAL | Status: DC

## 2010-06-14 MED ORDER — NORTRIPTYLINE HCL 10 MG PO CAPS: 10.0000 mg | ORAL_CAPSULE | Freq: Every day | ORAL | Status: DC

## 2010-06-14 NOTE — Assessment & Plan Note (Signed)
Well controlled on Propanolol 40mg  daily. COntinue

## 2010-06-14 NOTE — Assessment & Plan Note (Signed)
Controlled with Flonase. Continue.

## 2010-06-14 NOTE — Patient Instructions (Signed)
It was a pleasure to see you.  For your elbow pains, I recommend you get a 'tennis elbow strap" at the pharmacy and wrap it around your forearms.  Also, wrapping a bath towel folded lengthwise, around your elbow at bedtime while you sleep to prevent full flexion of the elbows.   Your weight is healthy, at a BMI of 22 (stable from last visit in January 27).  To avoid further weight gain and to prevent its impact on your sleep, try and reduce the nighttime Coke (sugar and caffeine).  Small changes in diet (Coke, nighttime snacks and ice cream) will make a big difference over time.  I am glad your nose is better.  Continue with the Flonase through the Spring pollen season.  Try taking half the dose of nortriptyline (ie, take 12.5mg ) one time nightly for sleep.  I would like to see you again in 4 to 6 months.  Have the staff make you an appointment with a female doctor in our office for PAP smear.

## 2010-06-14 NOTE — Assessment & Plan Note (Addendum)
Given description, and association with lifting, this is likely diagnosis.  Discussed tennis elbow strap, ways to prevent full flexion at night.  NSAIDs initially around the clock for a short while if she can tolerate given her esophageal stricture history, then prn for the pain.

## 2010-06-14 NOTE — Assessment & Plan Note (Addendum)
Improved, more social interaction. Some compulsive eating and disordered sleep.  Nortriptyline 25mg  at hs helps, but dries mouth.  To try 1/2 dose. SHe agrees to plan.

## 2010-06-14 NOTE — Progress Notes (Signed)
  Subjective:    Patient ID: Marie Harvey. Rease, female    DOB: 07/07/1955, 55 y.o.   MRN: [id number]  HPI Damian Leavell comes in today for followup.  Since her last visit, she has noted improvement in her anxiety and sleep issues with the nortriptyline 25mg  at bedtime.  It did dry her mouth out and thus she stopped when the prescription ran out a few days ago.    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  Has had subsequent bilateral elbow and wrist pain, which is worse later in the day.  Lysbeth Penner for the pain as needed.    Is concerned that she has gained weight/  Not doing physical exercise.  Is eating "a lot".  Prefers vegetables and meats, not many starches.  Does admit to 3 or more regular Coke sodas per day, including one in the evening.  Discussed role of this on weight and her ability to sleep.  She remarks that falling asleep is usually easier than staying asleep, awakens several times through the night.   Is socializing better than before.  Does not feel as isolated as before.   Flonase is helping her this Spring with rhinitis.    Review of Systems  No fevers or chills,  No cough, no ear pain.  She did not call doctor line for torus palatinus-- was her mother who goes by the same name.      Objective:   Physical Exam Well appearing, no apparent distress.  Surgically repaired cleft palate.  TMs clear.  Nasal turbinates clear.  Clear oropharynx.  MSK: Full active and passive ROM elbows, shoulders, wrists.  Negative FInkelsteins.  Handgrip symmetrical.  No evidence of erythema or effusion, no tenderness to palpation of olecranon or ulnar notch bilat.        Assessment & Plan:

## 2010-07-11 ENCOUNTER — Encounter: Payer: BC Managed Care – PPO | Admitting: Family Medicine

## 2010-07-18 ENCOUNTER — Other Ambulatory Visit: Payer: Self-pay | Admitting: Family Medicine

## 2010-07-18 DIAGNOSIS — Z1231 Encounter for screening mammogram for malignant neoplasm of breast: Secondary | ICD-10-CM

## 2010-08-05 NOTE — Consult Note (Signed)
[facility] ([place])  Patient:    Marie Harvey, Marie Harvey                    MRN: [id number] Proc. Date: 05/01/00 Adm. Date:  [id number] Attending:  Jeannette Corpus CC:         Douglass Rivers, M.D.  Sibyl Parr. Darrick Penna, M.D.  Telford Nab, R.N.   Consultation Report  REASON FOR CONSULTATION:  [age]-year-old white female seen in consultation at the request of Dr. Royal Hawthorn B. Fields and Dr. Douglass Rivers regarding management of anemia secondary to severe menorrhagia.  HISTORY:  Apparently, the patient has had long-standing history of irregular menstrual bleeding.  This had been so severe in December that her hemoglobin had fallen to 4 with hematocrit of 12%.  She has been seen by Dr. Farrel Gobble intermittently and I reviewed those records dating back to 2000.  It appears that she has been offered hormonal therapy (Depo-Provera and oral contraceptives) but has refused them in the past.  Patient recently (February 23, 2000) under an endometrial biopsy and Pap smear; the Pap smear was normal; the endometrial biopsy showed complex hyperplasia without atypia.  Currently, the patient is not having any bleeding.  From a gynecologic point of view, she is virginal, has never had any gynecologic surgery.  PAST MEDICAL HISTORY:  Patient has had correction of a trilogy of Fallot, facial reconstruction for a cleft palate and cleft lip, coarctation of the pulmonary artery and past history of pneumonia; she has also been anemic and has been depressed and has chronic sinusitis.  MEDICATIONS:  Accolate, Claritin-D and ferrous sulfate (Geritol).  SOCIAL HISTORY:  The patient does not smoke or drink.  She lives with her mother.  She has never been married.  It is notable that patients mother had endometrial carcinoma which we have treated in the past.  PAST SURGICAL PROCEDURE:  Patient has had a repair of the trilogy of Fallot by ______ at [facility].  PHYSICAL  EXAMINATION  VITAL SIGNS:  Weight 97 pounds.  Blood pressure 140/78.  Height 5 feet 5 inches.  GENERAL:  The patient is a healthy, slender white female in no acute distress.  HEENT:  Cleft lip repair which is well-healed.  NECK:  Supple without thyromegaly.  ABDOMEN:  Soft and nontender.  No masses or organomegaly.  The patient does not wish to have a pelvic exam today.  IMPRESSION:  Menorrhagia with associated anemia, endometrial biopsy showing endometrial hyperplasia with atypia.  I had a lengthy discussion with the patient regarding management options.  She is adamantly resistant to having a hysterectomy.  I think that alternative approach using medical management would be advisable.  The patient and I discussed this at some length and she is in agreement to take a course of using Depo-Provera 150 mg every three months; she receives her first dose today.  I would recommend the patient have a repeat injection of Depo-Provera in Dr. Wynelle Bourgeois office in three months and I have communicated this to Dr. Farrel Gobble by voice mail.  I would suggest she have a repeat endometrial biopsy in six months.  Thank you for allowing me to offer my opinions regarding the management of this patient.  DD:  05/01/00 TD:  05/02/00 Job: [id number]

## 2010-08-16 ENCOUNTER — Ambulatory Visit: Payer: BC Managed Care – PPO | Admitting: Family Medicine

## 2010-09-01 ENCOUNTER — Ambulatory Visit
Admission: RE | Admit: 2010-09-01 | Discharge: 2010-09-01 | Disposition: A | Discharge status: Home / Self Care | Payer: BC Managed Care – PPO | Source: Ambulatory Visit | Attending: Family Medicine | Admitting: Family Medicine

## 2010-09-01 DIAGNOSIS — Z1231 Encounter for screening mammogram for malignant neoplasm of breast: Secondary | ICD-10-CM

## 2010-12-19 ENCOUNTER — Ambulatory Visit (INDEPENDENT_AMBULATORY_CARE_PROVIDER_SITE_OTHER): Payer: BC Managed Care – PPO | Admitting: *Deleted

## 2010-12-19 DIAGNOSIS — Z23 Encounter for immunization: Secondary | ICD-10-CM

## 2011-04-04 ENCOUNTER — Encounter: Payer: Self-pay | Admitting: Family Medicine

## 2011-04-04 ENCOUNTER — Ambulatory Visit (INDEPENDENT_AMBULATORY_CARE_PROVIDER_SITE_OTHER): Payer: BC Managed Care – PPO | Admitting: Family Medicine

## 2011-04-04 VITALS — BP 130/80 | HR 69 | Ht 65.0 in | Wt 133.0 lb

## 2011-04-04 DIAGNOSIS — F339 Major depressive disorder, recurrent, unspecified: Secondary | ICD-10-CM

## 2011-04-04 DIAGNOSIS — G250 Essential tremor: Secondary | ICD-10-CM

## 2011-04-04 DIAGNOSIS — G252 Other specified forms of tremor: Secondary | ICD-10-CM

## 2011-04-04 DIAGNOSIS — G4700 Insomnia, unspecified: Secondary | ICD-10-CM

## 2011-04-04 DIAGNOSIS — K219 Gastro-esophageal reflux disease without esophagitis: Secondary | ICD-10-CM

## 2011-04-04 DIAGNOSIS — Z13220 Encounter for screening for lipoid disorders: Secondary | ICD-10-CM | POA: Insufficient documentation

## 2011-04-04 LAB — CBC
HCT: 48.2 % — ABNORMAL HIGH (ref 36.0–46.0)
Hemoglobin: 16.1 g/dL — ABNORMAL HIGH (ref 12.0–15.0)
MCH: 29.8 pg (ref 26.0–34.0)
MCHC: 33.4 g/dL (ref 30.0–36.0)
MCV: 89.1 fL (ref 78.0–100.0)
Platelets: 195 10*3/uL (ref 150–400)
RBC: 5.41 MIL/uL — ABNORMAL HIGH (ref 3.87–5.11)
RDW: 12.9 % (ref 11.5–15.5)
WBC: 5 10*3/uL (ref 4.0–10.5)

## 2011-04-04 LAB — COMPREHENSIVE METABOLIC PANEL
ALT: 14 U/L (ref 0–35)
AST: 17 U/L (ref 0–37)
Albumin: 4.8 g/dL (ref 3.5–5.2)
Alkaline Phosphatase: 92 U/L (ref 39–117)
BUN: 17 mg/dL (ref 6–23)
CO2: 30 mEq/L (ref 19–32)
Calcium: 9.9 mg/dL (ref 8.4–10.5)
Chloride: 101 mEq/L (ref 96–112)
Creat: 0.64 mg/dL (ref 0.50–1.10)
Glucose, Bld: 82 mg/dL (ref 70–99)
Potassium: 4.5 mEq/L (ref 3.5–5.3)
Sodium: 139 mEq/L (ref 135–145)
Total Bilirubin: 0.6 mg/dL (ref 0.3–1.2)
Total Protein: 7 g/dL (ref 6.0–8.3)

## 2011-04-04 LAB — LIPID PANEL
Cholesterol: 207 mg/dL — ABNORMAL HIGH (ref 0–200)
HDL: 56 mg/dL (ref >39)
LDL Cholesterol: 137 mg/dL — ABNORMAL HIGH (ref 0–99)
Total CHOL/HDL Ratio: 3.7 Ratio
Triglycerides: 68 mg/dL (ref <150)
VLDL: 14 mg/dL (ref 0–40)

## 2011-04-04 LAB — TSH: TSH: 1.715 u[IU]/mL (ref 0.350–4.500)

## 2011-04-04 MED ORDER — RANITIDINE HCL 150 MG/10ML PO SYRP: 150.0000 mg | ORAL_SOLUTION | Freq: Two times a day (BID) | ORAL | Status: DC

## 2011-04-04 MED ORDER — PAROXETINE HCL 10 MG PO TABS: 10.0000 mg | ORAL_TABLET | ORAL | Status: DC

## 2011-04-04 NOTE — Patient Instructions (Signed)
It was a pleasure to see you today.  1. For the question of sleep and depression issues, I stopped the Pamelor and started you on Paroxetine 10mg  one time daily.  2. For the chest pain thought to be related to reflux, I started you on Ranitidine 150mg  twice daily (it is in a syrup form).  Please take this every day as instructed, and we will talk about the symptoms at your next visit.   3. FOLLOW UP WITH DR Mauricio Po IN 2 to 4 WEEKS.   4. VISIT WITH FEMALE PHYSICIAN FOR PAP SMEAR ONLY.

## 2011-04-05 DIAGNOSIS — G4700 Insomnia, unspecified: Secondary | ICD-10-CM | POA: Insufficient documentation

## 2011-04-05 NOTE — Assessment & Plan Note (Signed)
No history of being on SSRI in the past; will start her on low-dose SSRI, counseled on potential side effects.  Plan to see back within 4 weeks (preferably 2 weeks) to follow up on this.  She stopped the Pamelor, will d/c on med list today.  PHQ9 score is 8 today, may follow up with future PHQ9.

## 2011-04-05 NOTE — Assessment & Plan Note (Signed)
Not done since 2009.  For fasting lipids and CMet today, she is fasting this morning.

## 2011-04-05 NOTE — Progress Notes (Signed)
  Subjective:    Patient ID: Marie Harvey, female    DOB: May 21, 1955, 56 y.o.   MRN: [id number]  HPI Marie Harvey comes in today for follow-up.  She reports that she has been trying to alter her diet, cut down on ice cream and candy and by objective measures has lost 4 lbs since last visit. BMI 22.  Still drinks a lot of Coke, throughout the day and into the evening.   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  Looking for work in hospitals.  Gets down about her job prospects.  Trying to get into exercise routine.  Had a personal trainer on Mondays, which stopped.    Reports trouble sleeping, both initiating sleep and staying asleep.  Stopped the Pamelor I had prescribed (ran out and never got it refilled).  Taking herbals ("Somnapure", contains valerian, camomile, melatonin and passion flower); also takes Unisom sometimes.   Denies any alcohol or smoking.  Not awakened by anything in particular (ie, no nocturia).    Review of Systems Reports "sinus headache" last week which lasted 3 days and resolved; positive nasal congestion and discharge, some cough with scant nonbloody sputum production. No fevers or chills.  Has had pain around lower chest/xyphoid region in midline when sitting in front of TV at night, No exertional chest pain, no diaphoresis.  Has had nausea and even an episode of emesis in association with the chest pain.  Last PAP smear in 2008 per her report; she has never had sexual intercourse and states that she would prefer a female physician to perform PAP.    Objective:   Physical Exam Alert, well -appearing, no apparent distress.  HEENT Neck supple, no cervical adenopathy.  Clear oropharynx (healed scars from palate surgery); no frontal or maxillary sinus tenderness.  TMs clear bilaterally.  COR S1S2, no extra sounds PULM Clear bilaterally, no rales or wheezes.  PHQ9 score 8 (zero points for Q#9; 3 pts for Q#3,6; 2 pts for Q#2).       Assessment & Plan:

## 2011-04-05 NOTE — Assessment & Plan Note (Signed)
Patient previously diagnosed with GERD and had been on omeprazole otc for some time; not taking anything now for this.  I suspect her ROS complaint of chest pain is actually GERD-related, and will try standing dose of Zantac twice daily for a period of 4 to 6 weeks.  I will be seeing her back in 4 weeks to follow up on the Paxil that was started today, and we will follow up on this as well.

## 2011-04-07 ENCOUNTER — Encounter: Payer: Self-pay | Admitting: Family Medicine

## 2011-04-21 ENCOUNTER — Other Ambulatory Visit: Payer: Self-pay

## 2011-04-21 ENCOUNTER — Ambulatory Visit (HOSPITAL_COMMUNITY)
Admission: RE | Admit: 2011-04-21 | Discharge: 2011-04-21 | Disposition: A | Discharge status: Home / Self Care | Payer: BC Managed Care – PPO | Source: Ambulatory Visit | Attending: Family Medicine | Admitting: Family Medicine

## 2011-04-21 ENCOUNTER — Encounter: Payer: Self-pay | Admitting: Family Medicine

## 2011-04-21 ENCOUNTER — Ambulatory Visit (INDEPENDENT_AMBULATORY_CARE_PROVIDER_SITE_OTHER): Payer: BC Managed Care – PPO | Admitting: Family Medicine

## 2011-04-21 DIAGNOSIS — Z8679 Personal history of other diseases of the circulatory system: Secondary | ICD-10-CM

## 2011-04-21 DIAGNOSIS — R9431 Abnormal electrocardiogram [ECG] [EKG]: Secondary | ICD-10-CM | POA: Insufficient documentation

## 2011-04-21 DIAGNOSIS — F339 Major depressive disorder, recurrent, unspecified: Secondary | ICD-10-CM

## 2011-04-21 DIAGNOSIS — R011 Cardiac murmur, unspecified: Secondary | ICD-10-CM | POA: Insufficient documentation

## 2011-04-21 DIAGNOSIS — Z13220 Encounter for screening for lipoid disorders: Secondary | ICD-10-CM

## 2011-04-21 DIAGNOSIS — D45 Polycythemia vera: Secondary | ICD-10-CM

## 2011-04-21 DIAGNOSIS — D751 Secondary polycythemia: Secondary | ICD-10-CM

## 2011-04-21 NOTE — Progress Notes (Signed)
  Subjective:    Patient ID: Marie Harvey, female    DOB: 04-02-1955, 56 y.o.   MRN: [id number]  HPI Seen today for follow up of fatigue/depressive symptoms, despite unimpressive PHQ9 at last visit.  She has been on the Paxil and reports no significant side effects.  Says she feels about the same but her mother notices a big improvement in her outlook. Still looking for more work, working part-time in Industrial/product designer.  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   Reports that she has had a chronic cough without sputum production, no hemoptysis or fevers/chills.  Never been a smoker.  No known pulmonary disease.    We discussed lab report, which is significant for elevated Hgb (not noted in earlier studies).  No known lung disease.  Has had Tetralogy of Fallot with last cardiac surgery in 1973.  Last ECHO 2003, does not follow with cardiology at present.  Has a known 1st degree AV block.  Denies chest pain or shortness of breath.   Sleep initiation has gotten a little better. Stopped the [employer].  Trying to cut back on evening soda consumption.   Review of Systems See HPI for relevant ROS.    Objective:   Physical Exam Well appearing, animated and in no acute distress.  HEENT Neck supple, no cervical adenopathy.  COR Regular; does have holosystolic murmur, pronounced S2 sound. PULM Clear bilaterally, no rales or wheezes.        Assessment & Plan:

## 2011-04-21 NOTE — Patient Instructions (Signed)
It was a pleasure to see you today.   I am glad to see you are doing well on the Paxil.  Please keep taking it as we discussed.  For the high hemoglobin level, I am rechecking this today to confirm it.  If it is still high, I will order a chest x-ray and an Echocardiogram to begin to look for possible lung disease or heart conditions that may cause this.  On occasion, this is associated with fatigue and tiredness.  I will call you once I have the results of the labs back, and we will make our plan accordingly.   Continue the ranitidine for another 4 weeks; then use only as needed for heartburn after that.  If it returns or doesn't respond to the medicine, please call to let me know.

## 2011-04-21 NOTE — Assessment & Plan Note (Signed)
Patient with polycythemia on recent CBC; no known pulmonary disease, but has hx Tetralogy of Fallot, with last cardiac surgery in 1973.  Also with chronic nonproductive cough on ROS today.  No chest pain or shortness of breath.  Concern for R->L shunt or PH as possible cause.  ECG done today does not show R axis deviation, confirms known 1st degree AV block.   For repeat CBC today; consider ECHO and CXR if again confirms elevated Hgb. Also to consider PFTs if these are normal.

## 2011-04-21 NOTE — Assessment & Plan Note (Signed)
LDL slightly above 130.  Discussed dietary changes and exercise to lower to below 130.

## 2011-04-21 NOTE — Assessment & Plan Note (Signed)
Plan for ECG today, possibly ECHO after confirming polycythemia by CBC today.  Consider PH as possible cause.

## 2011-04-21 NOTE — Assessment & Plan Note (Signed)
Feels somewhat better in her outlook since starting the Paxil.  Says her mother notices a big improvement.  Her PHQ9 today is 1.  This is a big reduction from her baseline at last visit. (8). To continue.

## 2011-04-22 LAB — CBC
HCT: 49.1 % — ABNORMAL HIGH (ref 36.0–46.0)
Hemoglobin: 16.1 g/dL — ABNORMAL HIGH (ref 12.0–15.0)
MCH: 30 pg (ref 26.0–34.0)
MCHC: 32.8 g/dL (ref 30.0–36.0)
MCV: 91.6 fL (ref 78.0–100.0)
Platelets: 185 10*3/uL (ref 150–400)
RBC: 5.36 MIL/uL — ABNORMAL HIGH (ref 3.87–5.11)
RDW: 13.4 % (ref 11.5–15.5)
WBC: 4.6 10*3/uL (ref 4.0–10.5)

## 2011-04-29 ENCOUNTER — Telehealth: Payer: Self-pay | Admitting: Family Medicine

## 2011-04-29 DIAGNOSIS — Q249 Congenital malformation of heart, unspecified: Secondary | ICD-10-CM

## 2011-04-29 DIAGNOSIS — D751 Secondary polycythemia: Secondary | ICD-10-CM

## 2011-04-29 NOTE — Telephone Encounter (Signed)
Called patient to report Hgb 16.1; marked change from her previous mild anemia.  Patient with history of Tetralogy of Fallot and childhood cardiac surgery; will order ECHO and CXR to look for possible cardiopulmonary causes for her relatively recent polycythemia.   Discussed with patient, who will await call for scheduling of ECHO and the CXR order.   Paula Compton, MD

## 2011-05-01 NOTE — Telephone Encounter (Signed)
Pt called back and I informed her of the appt. Lorenda Hatchet, Renato Battles

## 2011-05-01 NOTE — Telephone Encounter (Signed)
Waiting for pt to call back. Left message. Please tell pt:  APPT FOR 2 D ECHO AT [facility] 05-16-11 AT 2 PM. ARRIVE AT 1:45 PM AT OUTPATIENT ADMITTING 1ST FLOOR TO REGISTER. PT CAN ALSO GO TO RADIOLOGY AND HAVE HER X-RAY DONE (W/OUT APPT). CAN DO AT THE SAME DAY.  Lorenda Hatchet, Renato Battles

## 2011-05-10 ENCOUNTER — Encounter: Payer: BC Managed Care – PPO | Admitting: Family Medicine

## 2011-05-16 ENCOUNTER — Ambulatory Visit (HOSPITAL_COMMUNITY)
Admission: RE | Admit: 2011-05-16 | Discharge: 2011-05-16 | Disposition: A | Discharge status: Home / Self Care | Payer: BC Managed Care – PPO | Source: Ambulatory Visit | Attending: Family Medicine | Admitting: Family Medicine

## 2011-05-16 DIAGNOSIS — I079 Rheumatic tricuspid valve disease, unspecified: Secondary | ICD-10-CM | POA: Insufficient documentation

## 2011-05-16 DIAGNOSIS — D45 Polycythemia vera: Secondary | ICD-10-CM | POA: Insufficient documentation

## 2011-05-16 DIAGNOSIS — D751 Secondary polycythemia: Secondary | ICD-10-CM

## 2011-05-16 DIAGNOSIS — I379 Nonrheumatic pulmonary valve disorder, unspecified: Secondary | ICD-10-CM | POA: Insufficient documentation

## 2011-05-16 DIAGNOSIS — Q249 Congenital malformation of heart, unspecified: Secondary | ICD-10-CM

## 2011-05-17 ENCOUNTER — Telehealth: Payer: Self-pay | Admitting: Family Medicine

## 2011-05-17 ENCOUNTER — Encounter: Payer: Self-pay | Admitting: Family Medicine

## 2011-05-17 NOTE — Telephone Encounter (Signed)
Attempted to call patient at mobile phone number to report the findings of ECHO and CXR, which do not show elevated PA pressures, or R to L shunting.  I left a voice message, will send a letter to this effect.  Paula Compton, M.D.

## 2011-09-12 ENCOUNTER — Ambulatory Visit: Payer: BC Managed Care – PPO | Admitting: Family Medicine

## 2011-10-24 ENCOUNTER — Encounter: Payer: Self-pay | Admitting: Family Medicine

## 2011-10-24 ENCOUNTER — Ambulatory Visit (INDEPENDENT_AMBULATORY_CARE_PROVIDER_SITE_OTHER): Payer: BC Managed Care – PPO | Admitting: Family Medicine

## 2011-10-24 VITALS — BP 146/75 | HR 62 | Temp 97.6°F | Ht 65.0 in | Wt 127.2 lb

## 2011-10-24 DIAGNOSIS — F339 Major depressive disorder, recurrent, unspecified: Secondary | ICD-10-CM

## 2011-10-24 DIAGNOSIS — D45 Polycythemia vera: Secondary | ICD-10-CM

## 2011-10-24 DIAGNOSIS — D751 Secondary polycythemia: Secondary | ICD-10-CM

## 2011-10-24 LAB — CBC
HCT: 44.7 % (ref 36.0–46.0)
Hemoglobin: 15.4 g/dL — ABNORMAL HIGH (ref 12.0–15.0)
MCH: 30 pg (ref 26.0–34.0)
MCHC: 34.5 g/dL (ref 30.0–36.0)
MCV: 87.1 fL (ref 78.0–100.0)
Platelets: 172 10*3/uL (ref 150–400)
RBC: 5.13 MIL/uL — ABNORMAL HIGH (ref 3.87–5.11)
RDW: 13.4 % (ref 11.5–15.5)
WBC: 4.2 10*3/uL (ref 4.0–10.5)

## 2011-10-24 LAB — RETICULOCYTES
ABS Retic: 30.8 10*3/uL (ref 19.0–186.0)
RBC.: 5.13 MIL/uL — ABNORMAL HIGH (ref 3.87–5.11)
Retic Ct Pct: 0.6 % (ref 0.4–2.3)

## 2011-10-24 NOTE — Patient Instructions (Addendum)
It was nice to meet you today, Ms. Marie Harvey!  We are going to check your blood work today, and Dr. Mauricio Po will contact you in a few days to know the results.

## 2011-10-24 NOTE — Progress Notes (Signed)
Subjective:     Patient ID: Marie Harvey, female   DOB: [birth year], 56 y.o.   MRN: [id number]  HPI Marie Harvey is a 56 y/o woman with a history of past congenital heart disease who comes in to clinic today for follow up of polycythemia.   She states that overall she feels rather well. She does endorse some issues with sleeping, saying that sometimes it is hard for her to fall asleep. However, she reports that she normally gets about 7-8 hrs of sleep every night. She states she feels rested when she awakes.   Her only other complaint today is she does endorse some shortness of breath when she does a lot of heavy lifting at work. She does not have any shortness of breath at rest or with light exertion.   She denies other associated symptoms, including lightheadedness or dizziness, syncope or feelings of presyncope, nausea, vomiting, or diarrhea, or pain and tingling in her extremities.   PHQ-9 score of 1.  ESS score of 8    Review of Systems As per above.     Objective:   Physical Exam General: Well appearing, no acute distress HEENT: PERRLA, mmm and non-erythematous, left tympanic membrane is sclerotic, right tympanic membrane intact and with good light reflex, neck is supple and without adenopathy Pulm: Lungs are clear to auscultation bilaterally, normal effort of breathing CV: RRR, II/VI early diastolic murmur heard best at the left sternal border in the 2nd intercostal space, no gallops or clicks Abd: +BS, soft, non-tender, no organomegaly, no abdominal or renal bruits Ext: No edema, no clubbing or cyanosis    Assessment/Plan  1. Polycythemia: Asymptomatic at this point. ESS of 8, within normal range, reassuring that this is likely not sleep apnea. Will recheck CBC and consider further testing, including PFTs, based on those results. 2. Heart murmur: Diastolic murmur consistent with patients known mild pulmonary regurgitation (See echo from 05/16/2011). Consistent with patients history  of repaired TOF.    Attending Physician Note Patient seen and examined by me in conjunction with medical student Lenise Herald (MS3 [facility]), I agree with his documentation, with following additions: Briefly, Marie Harvey comes in today for follow up on her elevated HCt.  She has history of Tetralogy of Fallot with repair in childhood; workup for her polycythemia has not revealed structural heart disease or Pulm HTN by ECHO done this past Feb.  She reports feeling well; does have some mild shortwindedness with exertion.  Never been a smoker, no known lung disease.  Does not have shortness of breath at rest.    Exam: Well appearing, no apparent distress.  HEENT Neck supple, no cervical adenopathy. Sclerae clear. L TM with old scarring, no erythema or cerumen.  R TM mild scarring COR regular S1S2, mechanical split second heart sound with diastolic M.  PULM Clear bilaterally. No rales or wheezes PHQ9 score 2; indicates 2 pts for trouble initiating sleep. Epworth sleepiness scale 8 (normal range). Indicates she can often sleep after lunch; when she tries to nap during day; as passenger on long car trips.  Never sleeps while driving, at stoplight, or while in waiting room or meeting.  Assess/Plan: Patient with continued polycythemia (elevated Hct only, with normal plt and WBC counts).  She does not have structural heart findings on ECHO or pulm htn that would explain.  No diagnosis of lung dz that would fit; never smoker.  We discussed possibility of PFTs to objectively measure lung function as a potential cause.  Will recheck  her CBC and check retic count today.  Her MCV has been normal, with normal RDW as well.  To call her with results to discuss possible plan after today's labwork.  Given that she is feeling well, this is less concerning.  Paula Compton, MD

## 2011-10-25 ENCOUNTER — Telehealth: Payer: Self-pay | Admitting: Family Medicine

## 2011-10-25 DIAGNOSIS — D751 Secondary polycythemia: Secondary | ICD-10-CM

## 2011-10-25 NOTE — Telephone Encounter (Signed)
Spoke with patient and appointment scheduled for lab and nurse visit 08/14.

## 2011-10-25 NOTE — Assessment & Plan Note (Signed)
Assess/Plan: Patient with continued polycythemia (elevated Hct only, with normal plt and WBC counts).  She does not have structural heart findings on ECHO or pulm htn that would explain.  No diagnosis of lung dz that would fit; never smoker.  We discussed possibility of PFTs to objectively measure lung function as a potential cause.  Will recheck her CBC and check retic count today.  Her MCV has been normal, with normal RDW as well.  To call her with results to discuss possible plan after today's labwork.  Given that she is feeling well, this is less concerning.  Paula Compton, MD

## 2011-10-25 NOTE — Telephone Encounter (Signed)
Called patient to discuss the continued elevation of Hgb, with normal plt and WBC count. She reports she is feeling generally well; may note that she gets slightly more short of breath than usual with stair climbing.  Never smoker, no diagnosed lung disease. To check EPO level and resting and exertional Pulse Oximetry as part of nurse visit.  Will make determination about PFTs after EPO level is back.  Paula Compton, MD

## 2011-10-25 NOTE — Assessment & Plan Note (Signed)
Doing well from this standpoint.  She scores a 2 on PHQ9 today (Oct 24, 2011).  To continue current management, follow up on subsequent visits.

## 2011-11-01 ENCOUNTER — Other Ambulatory Visit: Payer: BC Managed Care – PPO

## 2011-11-01 ENCOUNTER — Other Ambulatory Visit: Payer: Self-pay | Admitting: Obstetrics & Gynecology

## 2011-11-01 DIAGNOSIS — Z1231 Encounter for screening mammogram for malignant neoplasm of breast: Secondary | ICD-10-CM

## 2011-11-02 ENCOUNTER — Other Ambulatory Visit: Payer: Self-pay | Admitting: Obstetrics & Gynecology

## 2011-11-02 DIAGNOSIS — M810 Age-related osteoporosis without current pathological fracture: Secondary | ICD-10-CM

## 2011-11-08 ENCOUNTER — Ambulatory Visit: Payer: BC Managed Care – PPO | Admitting: *Deleted

## 2011-11-08 ENCOUNTER — Other Ambulatory Visit: Payer: BC Managed Care – PPO

## 2011-11-08 DIAGNOSIS — D751 Secondary polycythemia: Secondary | ICD-10-CM

## 2011-11-08 DIAGNOSIS — D45 Polycythemia vera: Secondary | ICD-10-CM

## 2011-11-08 NOTE — Progress Notes (Signed)
ERYTHROPOIETIN DONE TODAY MARCI HOLDER

## 2011-11-08 NOTE — Progress Notes (Signed)
Patient  in for check of O2 Sat at rest and after exertion. Pulse OX at rest 98 %. Then patient walked around  hallway in office twice and  Pulse OX  gradually decreased to 94 %.  Patient was walking fast. Labs drawn today as directed by Dr.Breen.

## 2011-11-09 LAB — ERYTHROPOIETIN: Erythropoietin: 10 m[IU]/mL (ref 2.6–34.0)

## 2011-11-10 ENCOUNTER — Encounter: Payer: Self-pay | Admitting: Family Medicine

## 2011-11-22 ENCOUNTER — Ambulatory Visit
Admission: RE | Admit: 2011-11-22 | Discharge: 2011-11-22 | Disposition: A | Discharge status: Home / Self Care | Payer: BC Managed Care – PPO | Source: Ambulatory Visit | Attending: Obstetrics & Gynecology | Admitting: Obstetrics & Gynecology

## 2011-11-22 DIAGNOSIS — Z1231 Encounter for screening mammogram for malignant neoplasm of breast: Secondary | ICD-10-CM

## 2011-11-22 DIAGNOSIS — M810 Age-related osteoporosis without current pathological fracture: Secondary | ICD-10-CM

## 2011-12-06 ENCOUNTER — Telehealth: Payer: Self-pay | Admitting: Family Medicine

## 2011-12-06 NOTE — Telephone Encounter (Signed)
Hasn't heard results of her last labs - pls advise

## 2011-12-06 NOTE — Telephone Encounter (Signed)
I called patient back to discuss most recentCBC and erythropoeitin level.  She feels well other than a mild cold that just started.  She is to contact me as needed.  Paula Compton, MD

## 2011-12-12 ENCOUNTER — Ambulatory Visit: Payer: BC Managed Care – PPO

## 2011-12-19 ENCOUNTER — Encounter: Payer: Self-pay | Admitting: Family Medicine

## 2011-12-19 ENCOUNTER — Ambulatory Visit (INDEPENDENT_AMBULATORY_CARE_PROVIDER_SITE_OTHER): Payer: BC Managed Care – PPO | Admitting: Family Medicine

## 2011-12-19 VITALS — BP 149/87 | HR 65 | Temp 97.8°F | Ht 65.0 in | Wt 129.0 lb

## 2011-12-19 DIAGNOSIS — Z23 Encounter for immunization: Secondary | ICD-10-CM

## 2011-12-19 DIAGNOSIS — R300 Dysuria: Secondary | ICD-10-CM

## 2011-12-19 DIAGNOSIS — M545 Low back pain, unspecified: Secondary | ICD-10-CM

## 2011-12-19 DIAGNOSIS — M810 Age-related osteoporosis without current pathological fracture: Secondary | ICD-10-CM | POA: Insufficient documentation

## 2011-12-19 LAB — POCT URINALYSIS DIPSTICK
Bilirubin, UA: NEGATIVE
Blood, UA: NEGATIVE
Glucose, UA: NEGATIVE
Ketones, UA: NEGATIVE
Leukocytes, UA: NEGATIVE
Nitrite, UA: NEGATIVE
Protein, UA: NEGATIVE
Spec Grav, UA: 1.015
Urobilinogen, UA: 0.2
pH, UA: 5.5

## 2011-12-19 MED ORDER — CYCLOBENZAPRINE HCL 10 MG PO TABS: 10.0000 mg | ORAL_TABLET | Freq: Every evening | ORAL | Status: DC | PRN

## 2011-12-19 NOTE — Assessment & Plan Note (Signed)
Acute back pain from change in exercise routine.  Discussed isometric exercises, monitored ibuprofen use with ample hydration for short term (up to 5 days). Flexeril at bedtime.

## 2011-12-19 NOTE — Progress Notes (Signed)
  Subjective:    Patient ID: Marie Harvey, female    DOB: Aug 27, 1955, 56 y.o.   MRN: [id number]  HPI Damian Leavell presents today with c/o low back pain that started last Thursday morning (Sept 26). The day previous she had been working out with personal trainer doing leg extensions against increased resistance.  She reports soreness across low back; yesterday with some radiation down R leg but this has since resolved.  Overall, the pain is mildly better but still bothersome.  She has not had weakness in legs.   Using heat pack, Crista Elliot and intermittent ibuprofen.  The ibuprofen helps but she does not notice improvement with the other two modalities.  No workouts since then.   She is starting a new job next week.   Also, reviewed results of her recent bone density testing which shows Lumbar T-score -2.4 and L hip -3.1.  She is unable to take oral bisphosphonates due to UGI dilatation surgeries.   Review of Systems  Denies dysuria; has had longstanding polyuria which is not changed.  No turbid urine.      Objective:   Physical Exam Well appearing, no apparent distress.  HEENT Neck supple. No adenopathy.  MSK: SLR full ROM bilat; hip passive ROM full and symmetric. DTRs 3+ bilat (patellar).  Palpable dp pulses bilat. No ankle edema bilat. Sensation grossly intact bilaterally.  Gait full and ankle dorsiflexion/plantarflexion full and symmetric.        Assessment & Plan:

## 2011-12-19 NOTE — Patient Instructions (Addendum)
It was a pleasure to see you today.  Your back pain is from strained muscles in your lower back. This can take time to get better.  I recommend gentle repetitive stretching (demonstrated in office) frequently throughout the day.  Do not push through the pain with your exercise routine.   Remember good body mechanics with bending and lifting.   Ibuprofen 200 to 600mg  at a time, up to every 6 hours as needed,with something in your stomach and plenty of fluids.  Do not do this for more than 5 days. Keep using heat at frequent intervals.   Flexeril (Cyclobenzaprine 10mg ) muscle relaxant at bedtime for the next 5 days; this will make you sleepy, so do not take during the day.   We will discuss the once-a-year medicine for newly diagnosed osteoporosis.

## 2011-12-20 ENCOUNTER — Telehealth: Payer: Self-pay | Admitting: Family Medicine

## 2011-12-20 DIAGNOSIS — M810 Age-related osteoporosis without current pathological fracture: Secondary | ICD-10-CM

## 2011-12-20 DIAGNOSIS — K222 Esophageal obstruction: Secondary | ICD-10-CM | POA: Insufficient documentation

## 2011-12-20 NOTE — Telephone Encounter (Signed)
Called patient to discuss her history of prior esophageal stricture with dilatation done most recently in 2009 at [facility].  Because of this history, I am recommending her to be given Reclast 5mg  IV one time yearly for treatment of her newly diagnosed osteoporosis (DEXA).  She is intolerant of oral calcium and vitamin D, stating they upset her stomach.  She is being asked to have Calcium and Renal Function done in our lab prior to being scheduled for Short-Stay for administration of IV Reclast.  Paula Compton, MD

## 2012-01-16 ENCOUNTER — Telehealth: Payer: Self-pay | Admitting: *Deleted

## 2012-01-16 NOTE — Telephone Encounter (Signed)
Left message for patient to return call. Please have her schedule a lab visit per Dr Mauricio Po in order for her to have IV infusion for reclast.Busick, Rodena Medin

## 2012-01-17 NOTE — Telephone Encounter (Signed)
Spoke with patient's mother, she will schedule an appointment to have labs drawn then I will set up the order for IV infusion for reclast.Busick, Rodena Medin

## 2012-02-28 NOTE — Telephone Encounter (Signed)
After phone calls from Dr Mauricio Po and myself patient has yet to make an appointment for labs in order to schedule IV infusion for reclast. It has been over two months and I discussed with Dr Mauricio Po and he is ok with cancelling the orders for the infusion.Busick, Rodena Medin

## 2012-04-30 ENCOUNTER — Ambulatory Visit (INDEPENDENT_AMBULATORY_CARE_PROVIDER_SITE_OTHER): Payer: Managed Care, Other (non HMO) | Admitting: Family Medicine

## 2012-04-30 ENCOUNTER — Encounter: Payer: Self-pay | Admitting: Family Medicine

## 2012-04-30 VITALS — BP 130/80 | HR 60 | Ht 65.0 in | Wt 124.0 lb

## 2012-04-30 DIAGNOSIS — G4700 Insomnia, unspecified: Secondary | ICD-10-CM

## 2012-04-30 DIAGNOSIS — D751 Secondary polycythemia: Secondary | ICD-10-CM

## 2012-04-30 DIAGNOSIS — M533 Sacrococcygeal disorders, not elsewhere classified: Secondary | ICD-10-CM

## 2012-04-30 DIAGNOSIS — F339 Major depressive disorder, recurrent, unspecified: Secondary | ICD-10-CM

## 2012-04-30 DIAGNOSIS — M810 Age-related osteoporosis without current pathological fracture: Secondary | ICD-10-CM

## 2012-04-30 LAB — CBC
HCT: 44.5 % (ref 36.0–46.0)
Hemoglobin: 15 g/dL (ref 12.0–15.0)
MCH: 30.2 pg (ref 26.0–34.0)
MCHC: 33.7 g/dL (ref 30.0–36.0)
MCV: 89.7 fL (ref 78.0–100.0)
Platelets: 205 10*3/uL (ref 150–400)
RBC: 4.96 MIL/uL (ref 3.87–5.11)
RDW: 13.4 % (ref 11.5–15.5)
WBC: 4.4 10*3/uL (ref 4.0–10.5)

## 2012-04-30 LAB — COMPREHENSIVE METABOLIC PANEL
ALT: 16 U/L (ref 0–35)
AST: 16 U/L (ref 0–37)
Albumin: 4.5 g/dL (ref 3.5–5.2)
Alkaline Phosphatase: 74 U/L (ref 39–117)
BUN: 18 mg/dL (ref 6–23)
CO2: 33 mEq/L — ABNORMAL HIGH (ref 19–32)
Calcium: 9.5 mg/dL (ref 8.4–10.5)
Chloride: 103 mEq/L (ref 96–112)
Creat: 0.63 mg/dL (ref 0.50–1.10)
Glucose, Bld: 67 mg/dL — ABNORMAL LOW (ref 70–99)
Potassium: 4.4 mEq/L (ref 3.5–5.3)
Sodium: 140 mEq/L (ref 135–145)
Total Bilirubin: 0.6 mg/dL (ref 0.3–1.2)
Total Protein: 6.4 g/dL (ref 6.0–8.3)

## 2012-04-30 MED ORDER — TRAMADOL HCL 50 MG PO TABS: 50.0000 mg | ORAL_TABLET | Freq: Three times a day (TID) | ORAL | Status: DC | PRN

## 2012-04-30 NOTE — Assessment & Plan Note (Signed)
In light of history upper GI motility d/o, oral bisphosphonates are not recommedned.  She has seen a dentist most recently in Sept 2013 and is scheduled back there in May 2013.  Letter for dentist, requesting resolution of any pending dental work prior to startingbisphosphonates.  She may be a good candidate for Reclast at Short Stay.  Will check D level and recommend continued D and Calcium supplementations for now.

## 2012-04-30 NOTE — Assessment & Plan Note (Signed)
Did not tolerate paroxetine due to nausea.  Did not notice much improvement. Is not interseted in starting a new SSRI, and in fact has been off the medication for some time now.  To readdress in future visits.

## 2012-04-30 NOTE — Progress Notes (Signed)
  Subjective:    Patient ID: Marie Harvey, female    DOB: 14-Jul-1955, 57 y.o.   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  She has been working there for 5 months and enjoys her job.  Practices good body mechanics when tending to patients.  Wakes up at 4am to get to work by [employer]; goes to sleep at 8pm.  She takes Aleve or Motrin frequently (almost daily) for her back.  Tailbone pain when sitting for awhile.  No specific incident of trauma.  We discussed osteoporosis, diagnosed by DEXA scan.  She is taking gummy calcium supplements.  Had low Vit D level in the past.   Last PAP in Sept 2013, [facility].  Told it was normal.   She stopped the paroxetine because "I couldn't tell the difference" but did have nausea. She says she does feel depressed from time to time, but not severe. She would prefer not to take a medication for this and is not interested in trying a different antidepressant.  Continues with constant ringing in R ear.  Is scheduled to see a Hearing Clinician on Thursday Feb 13, plans to address this then.    Is up to date on colon cancer screening, flu shot, and mammography.     Review of Systems denies fevers, chills, chest pain, shortness of breath, falls or trauma.  NO changes in bowel habits.      Objective:   Physical Exam Well appearing, no apparent distress  HEENT neck supple. TM on R is clear; the L TM is not-erythematous but does have evidence of old scarring.  Clear oropharynx/mucus membranes. PERRL. COR Regular S1S2, mechanical heart sound and ?diastolic murmur.  (ECHO done last year as part of workup for erythrocytosis). ABDSoft nontender.  LEs; no edema noted.        Assessment & Plan:

## 2012-04-30 NOTE — Assessment & Plan Note (Signed)
May be related to her recent work change [employer]) and strenuous nature of her work.  May use tramadol periodically (codeine reaction is nausea).  Discussed trying to be sparing with use of NSAIDs.

## 2012-04-30 NOTE — Assessment & Plan Note (Signed)
Seems to be much better since she started working.  Monitor.

## 2012-04-30 NOTE — Patient Instructions (Addendum)
It was a pleasure to see you today.  We are checking your CBC to see if your hemoglobin has normalized; also the vitamin D level and kidney/liver panel.    Please continue taking the calcium supplements (1200mg  a day) and vitamin D (800 to 1000 International Units (IU) a day).  I will call you at [phone] with results of labs.   We will aim to start medicine for osteoporosis once you are cleared by your dentist.  Letter for dentist written today.   Please ask the Hearing Clinician about the ringing in your right ear when you go this coming Thursday (in 2 days).

## 2012-04-30 NOTE — Assessment & Plan Note (Signed)
To reassess with CBC today.  She has had prior workup including ECHO, CXR.

## 2012-05-01 ENCOUNTER — Telehealth: Payer: Self-pay | Admitting: Family Medicine

## 2012-05-01 DIAGNOSIS — E559 Vitamin D deficiency, unspecified: Secondary | ICD-10-CM | POA: Insufficient documentation

## 2012-05-01 LAB — VITAMIN D 25 HYDROXY (VIT D DEFICIENCY, FRACTURES): Vit D, 25-Hydroxy: 16 ng/mL — ABNORMAL LOW (ref 30–89)

## 2012-05-01 MED ORDER — VITAMIN D3 1.25 MG (50000 UT) PO CAPS: 1.0000 | ORAL_CAPSULE | ORAL | Status: DC

## 2012-05-01 NOTE — Telephone Encounter (Signed)
Called to report lab results, including information that patient is vitamin D-deficient.  Left voice mail.  Will have patient take vitamin D 50,000 IU one time weekly for 8 weeks; then repeat vitamin D level 2 to 4 weeks after completing. JB

## 2012-05-17 ENCOUNTER — Telehealth: Payer: Self-pay | Admitting: Family Medicine

## 2012-05-17 NOTE — Telephone Encounter (Signed)
Clinical information completed on [employer]  Attending Physician Report.  Placed in Dr. Marinell Blight box to complete physician questions.  Ileana Ladd

## 2012-05-17 NOTE — Telephone Encounter (Signed)
Patient dropped off forms to be filled out for accident disability insurance.  Please call her when completed.

## 2012-05-22 NOTE — Telephone Encounter (Signed)
Patient's form Administrator Attending Physician's Report (APR) at patient's expense" form is completed.  Will require a copy of 2 most recent office notes from 04/30/2012 and 12/19/2011, to be appended to the form.  JB

## 2012-05-22 NOTE — Telephone Encounter (Signed)
Left message for [name] at [phone], that [employer] Attending Physician Report has been completed by Dr. Mauricio Po and is ready to be picked up at front desk.  Ileana Ladd

## 2012-09-03 ENCOUNTER — Ambulatory Visit (INDEPENDENT_AMBULATORY_CARE_PROVIDER_SITE_OTHER): Payer: Managed Care, Other (non HMO) | Admitting: Family Medicine

## 2012-09-03 ENCOUNTER — Encounter: Payer: Self-pay | Admitting: Family Medicine

## 2012-09-03 VITALS — BP 134/87 | HR 62 | Ht 65.0 in | Wt 115.6 lb

## 2012-09-03 DIAGNOSIS — M533 Sacrococcygeal disorders, not elsewhere classified: Secondary | ICD-10-CM

## 2012-09-03 DIAGNOSIS — F339 Major depressive disorder, recurrent, unspecified: Secondary | ICD-10-CM

## 2012-09-03 DIAGNOSIS — M810 Age-related osteoporosis without current pathological fracture: Secondary | ICD-10-CM

## 2012-09-03 DIAGNOSIS — G4700 Insomnia, unspecified: Secondary | ICD-10-CM

## 2012-09-03 DIAGNOSIS — E559 Vitamin D deficiency, unspecified: Secondary | ICD-10-CM

## 2012-09-03 LAB — BASIC METABOLIC PANEL
BUN: 20 mg/dL (ref 6–23)
CO2: 29 mEq/L (ref 19–32)
Calcium: 9.8 mg/dL (ref 8.4–10.5)
Chloride: 104 mEq/L (ref 96–112)
Creat: 0.69 mg/dL (ref 0.50–1.10)
Glucose, Bld: 81 mg/dL (ref 70–99)
Potassium: 4.8 mEq/L (ref 3.5–5.3)
Sodium: 139 mEq/L (ref 135–145)

## 2012-09-03 MED ORDER — TRAMADOL HCL 50 MG PO TABS: 50.0000 mg | ORAL_TABLET | Freq: Three times a day (TID) | ORAL | Status: DC | PRN

## 2012-09-03 MED ORDER — NORTRIPTYLINE HCL 10 MG PO CAPS: 10.0000 mg | ORAL_CAPSULE | Freq: Every day | ORAL | Status: DC

## 2012-09-03 NOTE — Patient Instructions (Addendum)
It was a pleasure to see you today.   For the lack of sleep, this is our plan:  1. Try to limit or eliminate all caffeine (Coke, coffee/tea) in the afternoons (say, after 4pm).  2. Keep up with routine physical activity, as this can restore normal sleep patterns.  3. Stop all over-the-counter medicines for sleep (sleep aids).  4. We are starting you back on Pamelor (nortriptyline) 10mg , take 1 tablet about 1-2 hours before the time when you want to go to sleep (say, 7-8pm if you want bedtime to be 9pm).  If the effects of the medicine linger in the morning ("hangover effect"), you may try taking it an hour EARLIER the next night.  Please let me know if this regimen is not helping with your sleep.  Also, the Nortriptyline is a very helpful treatment to prevent the type of headaches which I think you have (tension headaches).   Today we are checking your vitamin D level.  I will call you with the results (email: [email or web address]) or call your home. If the levels have normalized, then I recommend Vitamin D [phone] international units (IU) one time daily as a maintenance dose.   Viactiv Calcium supplements, 1200mg  daily of calcium.

## 2012-09-04 ENCOUNTER — Telehealth: Payer: Self-pay | Admitting: Family Medicine

## 2012-09-04 DIAGNOSIS — E559 Vitamin D deficiency, unspecified: Secondary | ICD-10-CM

## 2012-09-04 LAB — VITAMIN D 25 HYDROXY (VIT D DEFICIENCY, FRACTURES): Vit D, 25-Hydroxy: 21 ng/mL — ABNORMAL LOW (ref 30–89)

## 2012-09-04 MED ORDER — VITAMIN D3 1.25 MG (50000 UT) PO CAPS: 1.0000 | ORAL_CAPSULE | ORAL | Status: DC

## 2012-09-04 NOTE — Assessment & Plan Note (Signed)
Discussed sleep hygiene, decrease in caffeine intake for 5-6 hrs before sleep.  To stop all otc meds for sleep; she had used Pamelor low-dose in the past.  This may also help with HA prophylaxis.  Will return to nortriptyline 10mg  at hs, discussed possible side effects. She does not drink alcohol.  Plan to follow up if not helping significantly with sleep. Also to consider PHQ9 at reassessment if not improved.

## 2012-09-04 NOTE — Assessment & Plan Note (Signed)
To recheck Vitamin D level; replete as indicated by results.  Recommended supplemental calcium 1200mg /day as well.  Increased weight-bearing exercise for bone and cardiac health.

## 2012-09-04 NOTE — Progress Notes (Signed)
  Subjective:    Patient ID: Marie Harvey, female    DOB: 1956/01/21, 57 y.o.   MRN: [id number]  HPI Marie Harvey is here for follow up.  Still having problems with sleep.  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 Has used otc Unisom and Somnex , as well as natural sleep aid without success.  Tried using her mother's Ambien, but it "kept me awake".  Sleep time at 9pm; drinks sodas as main caffeine source.  Does not drink coffee or tea.  Gets physical activity 1x/week on average.    Had taken Vit D repletion dose (50.000 IU/week), but never came back for recheck of Vit D level.  Is not taking D now.  Is taking Calcium supplementation (friend told her about [place]).  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  No focal area of pain or dysfunction.  Gets periodic headaches in band distribution around head, across frontal area.    Review of Systems No fevers or chills.    Objective:   Physical Exam Well appearing, no apparent dsiterss HEENT neck supple, no adenopathy. No frontal or maxillary sinus tenderness. Thyroid supple. COR Regular S1s2 PULM Clear bilaterally.        Assessment & Plan:

## 2012-09-04 NOTE — Telephone Encounter (Signed)
Called patient's home and left  Voice message regarding low vit D level and need for repletion, that I am sending Rx to her pharmacy for repletion.  She is instructed to call back with questions.  Will want to check Vit D level 2-4 weeks after repletion is completed.  JB

## 2012-10-04 ENCOUNTER — Ambulatory Visit (INDEPENDENT_AMBULATORY_CARE_PROVIDER_SITE_OTHER): Payer: Managed Care, Other (non HMO) | Admitting: Family Medicine

## 2012-10-04 ENCOUNTER — Encounter: Payer: Self-pay | Admitting: Family Medicine

## 2012-10-04 VITALS — BP 149/85 | HR 64 | Wt 113.0 lb

## 2012-10-04 DIAGNOSIS — K219 Gastro-esophageal reflux disease without esophagitis: Secondary | ICD-10-CM

## 2012-10-04 DIAGNOSIS — G4700 Insomnia, unspecified: Secondary | ICD-10-CM

## 2012-10-04 MED ORDER — TRAZODONE HCL 50 MG PO TABS: 25.0000 mg | ORAL_TABLET | Freq: Every evening | ORAL | Status: DC | PRN

## 2012-10-04 MED ORDER — RANITIDINE HCL 150 MG PO CAPS: 150.0000 mg | ORAL_CAPSULE | Freq: Two times a day (BID) | ORAL | Status: DC

## 2012-10-04 NOTE — Progress Notes (Signed)
Subjective:     Patient ID: Marie Harvey, female   DOB: Nov 18, 1955, 57 y.o.   MRN: [id number]  HPI This is a 57 yo woman with hx of TOF, GERD, major depression, and insomnia experiencing inability to sleep and N/V.  Inability to sleep This appt is to f/u on issues on insomnia for which the pt was seen on September 03 2012 and treated with nortriptyline 10 mg qday at bedtime. She reports that the nortriptyline has not improved her sleep symptoms at all, and that she is currently taking 20 mg of nortriptyline w/o any improvement. She usually takes this medicine around 7 pm and will not be able to sleep until midnight. Good sleep hygiene has been maintained with lights off, no tv, no computer, and only occasional reading by lamplight in the hours preceding 9 pm which is her bedtime. She denies taking naps during waking hours and only wakes up at night only to urinate or tend to her cat. Coke is withheld after 4 pm and she denies consumption of other caffeinated beverages. Pt has tried her mother's Ambien which she reports allows her to sleep. Dose varies between 0.25-0.5 of a 10 mg tab.   She reports good energy and mood throughout the day, and is maintaining an active lifestyle (recently went hiking). She denies any depression at today's visit.   N/V Pt has been experiencing N/V almost every morning for the past 3-4 mos. She experiences this only when she eats fried eggs with breakfast and is not experiencing nausea or abdominal discomfort at today's appt. Vomiting only occurs once per episode of nausea. She has noticed that she does not have these symptoms when eating toast w/ bacon, oatmeal or cereal, but reports that these items do not satisfy her hunger until lunch time. Drinking water and peptobismol have improved symptoms. Pt endorses burping a lot recently.   She denies HA, fevers, dizziness, lightheadedness, abdominal pain, change in appetite, unexpected wt loss, chest pain/burning.   Review of  Systems As per HPI    Attending Note: Patient seen and examined by me in conjunction with Marie Harvey, I agree with his documentation.  Marie Harvey continues with trouble with sleep; observes good sleep hygiene and avoids Coke after 4pm.  Exercising regularly (went on hike last w/e).  Does not feel down or depressed, no anhedonia.  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  Does not sleep during the day. Nortriptyline not helping; has taken her mother's Ambien and this helps with sleep initiation.   Also remarks about frequent (almost daily) nausea, often followed by single episode emesis, after breakfast (which nearly always consists of bacon and fried egg).  None in the past 3 days since switching to scrambled egg. No abd pain or heartburn.  No changes in bowel habits or stool appearance, no constipation. No fevers or chills. JB  Objective:   Physical Exam General: NAD. A&Ox3. Speaks in full sentences and answers questions appropriately.  Alert, pleasant, no apparent distress.   Head and Neck: NCAT. Neck supple. No cervical, pre/post-auricular, occipital, or supraclavicular lymphadenopathy. No scleral icterus or conjunctival pallor. No thyromegaly or thyroid nodules.  Neck supple, No cervical adenopathy No thyroid nodules or tenderness. JB  CV: RRR. Normal S2. 3/6 early systolic ejection murmur heard in aortic, pulmonic, and tricuspid areas. No rubs, or gallops. Radial pulses 2+ bilaterally. Regular S1S2 with III/VI SEM. JB  Pulm: All lung fields clear and equal to auscultation bilaterally. Clear lung fields bilaterally.  JB  Abd: +BS, soft, non-tender, non-distended abdomen w/o rebound or guarding. Surgical scars present near costal margins bilaterally and midline incisional scar extending to xiphoid. Cherry hemangiomas present on skin.   Audible bowel sounds. Soft, nontender, nondistended, no masses or megaly.  JB  Filed Vitals:   10/04/12 0830  BP: 149/85  Pulse: 64  Weight: 113  lb (51.256 kg)   Problem List 1. Insomnia 2. N/V    Assessment:     This is a 57 yo woman with hx of TOF, GERD, major depression, and insomnia experiencing inability to sleep and N/V that is suggestive of GERD.    Plan:     1. Insomnia - Pt placed on Trazadone 50 mg PO qday before bedtime. She currently has good sleep hygiene and was encouraged to continue those habits and watch caffeine intake. Continued problem with insomnia.  Will change nortriptyline for Desyrel to see if improvement in sleep. Continue with exercise, sleep hygiene.  Discouraged use of mother's Ambien. Marie Harvey shares concern for what would likely be an every-night need of Ambien once she starts using it. JB  2. GERD - N/V with frequent belching most notable after eating is suggestive of GERD. Lack of heartburn-type symptoms and the isolated occurrence with fried eggs is atypical. Other items on the differential include non-immunologic adverse food rxn or food poisoning (e.g. Salmonella). The fact that her N/V occurs when she eats fried eggs may indicate undercooking, which makes food poisoning more likely of the two. For now, pt will be treated for GERD with 150 mg Ranitidine PO bid. Additionally, she was advised to no longer eat fried eggs (only scrambled, omellettes).  Recurrent nausea, with frequent vomiting.  Clearly related to breakfast, and even more specifically to fried eggs. May try H2blocker for now, as well as dietary modification.  For follow up , to check up on weight as well as symptom control. JB  Pt f/u with Dr. Mauricio Po in 1 mo for re-eval of GERD symptoms and insomnia.

## 2012-10-04 NOTE — Patient Instructions (Addendum)
For sleep we will start trazadone 50 mg 1 time each day at bedtime. Please continue to maintain good sleep hygiene and watch intake of cafienated beverages.   For your nausea and vomiting we will start you on ranitidine 150 mg twice daily. Also, avoid fried eggs (scrambled eggs and omelettes are ok)  F/u in 1 mo with Dr. Mauricio Po.

## 2012-10-04 NOTE — Assessment & Plan Note (Signed)
Recurrent nausea, with frequent vomiting.  Clearly related to breakfast, and even more specifically to fried eggs. May try H2blocker for now, as well as dietary modification.  For follow up , to check up on weight as well as symptom control. JB

## 2012-10-04 NOTE — Assessment & Plan Note (Signed)
Continued problem with insomnia.  Will change nortriptyline for Desyrel to see if improvement in sleep. Continue with exercise, sleep hygiene.  Discouraged use of mother's Ambien. [name] shares concern for what would likely be an every-night need of Ambien once she starts using it. JB

## 2012-12-20 ENCOUNTER — Encounter: Payer: Self-pay | Admitting: Family Medicine

## 2012-12-20 ENCOUNTER — Ambulatory Visit (INDEPENDENT_AMBULATORY_CARE_PROVIDER_SITE_OTHER): Payer: Managed Care, Other (non HMO) | Admitting: Family Medicine

## 2012-12-20 VITALS — BP 135/84 | HR 58 | Ht 65.0 in | Wt 107.0 lb

## 2012-12-20 DIAGNOSIS — K921 Melena: Secondary | ICD-10-CM

## 2012-12-20 DIAGNOSIS — Z23 Encounter for immunization: Secondary | ICD-10-CM

## 2012-12-20 DIAGNOSIS — B029 Zoster without complications: Secondary | ICD-10-CM

## 2012-12-20 DIAGNOSIS — R634 Abnormal weight loss: Secondary | ICD-10-CM

## 2012-12-20 LAB — CBC WITH DIFFERENTIAL/PLATELET
Basophils Absolute: 0 10*3/uL (ref 0.0–0.1)
Basophils Relative: 0 % (ref 0–1)
Eosinophils Absolute: 0.1 10*3/uL (ref 0.0–0.7)
Eosinophils Relative: 1 % (ref 0–5)
HCT: 46.2 % — ABNORMAL HIGH (ref 36.0–46.0)
Hemoglobin: 16.3 g/dL — ABNORMAL HIGH (ref 12.0–15.0)
Lymphocytes Relative: 22 % (ref 12–46)
Lymphs Abs: 1 10*3/uL (ref 0.7–4.0)
MCH: 31.3 pg (ref 26.0–34.0)
MCHC: 35.3 g/dL (ref 30.0–36.0)
MCV: 88.7 fL (ref 78.0–100.0)
Monocytes Absolute: 0.4 10*3/uL (ref 0.1–1.0)
Monocytes Relative: 8 % (ref 3–12)
Neutro Abs: 3.1 10*3/uL (ref 1.7–7.7)
Neutrophils Relative %: 69 % (ref 43–77)
Platelets: 186 10*3/uL (ref 150–400)
RBC: 5.21 MIL/uL — ABNORMAL HIGH (ref 3.87–5.11)
RDW: 13.2 % (ref 11.5–15.5)
WBC: 4.5 10*3/uL (ref 4.0–10.5)

## 2012-12-20 LAB — TSH: TSH: 0.976 u[IU]/mL (ref 0.350–4.500)

## 2012-12-20 MED ORDER — ACYCLOVIR 800 MG PO TABS: 800.0000 mg | ORAL_TABLET | Freq: Every day | ORAL | Status: AC

## 2012-12-20 NOTE — Patient Instructions (Addendum)
It was a pleasure to see you today.   For the constipation/weight loss/change in bowel habits, I am ordering labs today and will contact you next week with results.  I recommend a consultation with the gastroenterologist for consideration of another endoscopy to look in your colon.  For the lesions on the right buttocks, these look like shingles.  Because they came up so recently, they may heal more quickly by treating with a medicine called ACYCLOVIR 800MG , TAKE 1 CAPSULE FIVE TIMES A DAY FOR 7 DAYS.  Tylenol as needed for pain and burning.   I would like to see you back in 2 to 4 weeks, or sooner to see how you are doing.   Letter for work done today; flu shot today.

## 2012-12-21 NOTE — Assessment & Plan Note (Signed)
Reported blood in stool, weight loss (observed in office vitals), and worsening constipation/change bowel habits. For GI referral, labs today.

## 2012-12-21 NOTE — Progress Notes (Signed)
  Subjective:    Patient ID: Marie Harvey, female    DOB: 10-May-1955, 57 y.o.   MRN: [id number]  HPI [name] is here for follow up, says the sleep issues are much better with medication.  Takes trazodone at 7pm and sleeps through the night, wakes at 5am for work.  No hangover effect.   Reports continued nausea that is associated with certain foods (grits are especially bad).  No early satiety, appetite is relatively good.  Has had problem with constipation for years and this has gotten worse since last visit, has to take Ex-Lax in order to go to the bathroom.  Had BM yesterday, prior to that was 3 weeks since penultimate BM.  Has seen blood when she has BM; no tenesmus.  Nausea as above, but no fever/chills/sweats.  Has had unintentional weight loss over time (107# today, was 113# on 7/18).  She had a screening colonoscopy in July 2009 in [facility], was told normal and follow up in 10 yrs for screening.  Remarks about burning and itching area on R buttock that began yesterday.    Review of Systems     Objective:   Physical Exam Well appearing, no apparent distress HEENT Neck supple without adenopathy.  COR Mechanical murmur, S1S2 PULM Clear bilaterally ABD Soft, nontender,nondistended. Audible bowel sounds. No masses or organomegaly.  DRE: Excoriated and deroofed vesicles on an erythematous base extending from just R of natal cleft, along dermatomal distribution across the R buttock. No fluctuance or purulence.  No anal fissures noted; DRE without masses or hemorrhoids; guaiac positive brown stool.        Assessment & Plan:

## 2012-12-21 NOTE — Assessment & Plan Note (Signed)
Right buttock, first noticed 24 hrs ago.  Excoriated deroofed vesicles with erythematous base c/w shingles on exam. Acyclovir 800mg  5 times daily for 7 days. Tylenol for pain.

## 2012-12-23 ENCOUNTER — Encounter: Payer: Self-pay | Admitting: Family Medicine

## 2012-12-24 ENCOUNTER — Telehealth: Payer: Self-pay | Admitting: Family Medicine

## 2012-12-24 NOTE — Telephone Encounter (Signed)
Patient inquiring about labwork results done on 10/3. Please call patient at [phone].

## 2012-12-24 NOTE — Telephone Encounter (Signed)
Will fwd. To Dr.Breen for review. Thanks. Lorenda Hatchet, Renato Battles

## 2012-12-25 NOTE — Telephone Encounter (Signed)
Gave patient results per MD.  Pt reports lesions are much better since starting acyclovir.  [place], Darlyne Russian, CMA

## 2012-12-25 NOTE — Telephone Encounter (Signed)
I had sent a letter to patient informing her of results, but I see the letter is still listed as 'open'.  Please call her to let her know that labs were unremarkable (thyroid normal; her hemoglobin was mildly elevated again, similar to how it has been on previous occasions).  No change in our plan. I strongly recommend that she schedule with GI for evaluation of weight loss and abdominal pain; would like to know if the lesions on her buttock have improved with the acyclovir.  I am out of the office today.  Thanks,  JB

## 2013-01-07 ENCOUNTER — Encounter: Payer: Self-pay | Admitting: Family Medicine

## 2013-01-07 ENCOUNTER — Ambulatory Visit (INDEPENDENT_AMBULATORY_CARE_PROVIDER_SITE_OTHER): Payer: Managed Care, Other (non HMO) | Admitting: Family Medicine

## 2013-01-07 VITALS — BP 141/73 | HR 72 | Temp 98.2°F | Ht 65.0 in | Wt 103.1 lb

## 2013-01-07 DIAGNOSIS — R112 Nausea with vomiting, unspecified: Secondary | ICD-10-CM | POA: Insufficient documentation

## 2013-01-07 LAB — COMPREHENSIVE METABOLIC PANEL
ALT: 13 U/L (ref 0–35)
AST: 17 U/L (ref 0–37)
Albumin: 4.5 g/dL (ref 3.5–5.2)
Alkaline Phosphatase: 71 U/L (ref 39–117)
BUN: 17 mg/dL (ref 6–23)
CO2: 30 mEq/L (ref 19–32)
Calcium: 9.6 mg/dL (ref 8.4–10.5)
Chloride: 102 mEq/L (ref 96–112)
Creat: 0.62 mg/dL (ref 0.50–1.10)
Glucose, Bld: 94 mg/dL (ref 70–99)
Potassium: 4.1 mEq/L (ref 3.5–5.3)
Sodium: 141 mEq/L (ref 135–145)
Total Bilirubin: 1.2 mg/dL (ref 0.3–1.2)
Total Protein: 6.7 g/dL (ref 6.0–8.3)

## 2013-01-07 MED ORDER — ONDANSETRON HCL 4 MG PO TABS: 4.0000 mg | ORAL_TABLET | Freq: Three times a day (TID) | ORAL | Status: DC | PRN

## 2013-01-07 MED ORDER — ONDANSETRON 4 MG PO TBDP
4.0000 mg | ORAL_TABLET | Freq: Once | ORAL | Status: AC
  Administered 2013-01-07: 4 mg via ORAL

## 2013-01-07 NOTE — Patient Instructions (Signed)
It was a pleasure to see you today.  While I believe your nausea/vomiting is due to a stomach virus, but I would like to watch this very closely if you do not experience rapid improvement with small volumes of clear liquids over time.   Please take sips of clear liquids throughout the day today, and progress to bland foods later today if tolerated.   Zofran 4mg  tablets for continued nausea and vomiting; it may make you sleepy.  If you continue with Nausea and vomiting, please go to [facility] for an abdominal x-ray, and call our office.   Out of work today and tomorrow.  Please keep your appointment on Nov 5 with the Gastroenterologist.   Follow up with me after that visit.

## 2013-01-07 NOTE — Progress Notes (Signed)
Subjective:     Patient ID: Marie Harvey, female   DOB: [birth year], 57 y.o.   MRN: [id number] Attending Note: Patient seen and examined by me in conjunction with [facility] MS3, I agree with her note as documented with the additions in bold: Marie Harvey presents today for SDA for acute onset of nausea with vomiting that had onset at 0430am yesterday.  Had at least two episodes of vomiting with clear vomitus, no hematemesis.   She has had constipation of late, with some dry stools that are nonbloody and without mucus.  Today she has been able to take some clear liquids and keep them down, but continues to have nausea.  She denies fevers/chills, denies cough, dyspnea, no bloating, no dysuria or changes in urinary habits.  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  We have been working up patient for unexplained weight loss and some changes in bowel habits, including constipation.  She has an appointment with Gastroenterology in the first week of November.  JB  HPI Marie Harvey is a 57 yo female w/a 1-yr history of unexpected weight loss, severe constipation, and GERD who complains of nausea and vomiting that started yesterday morning. She vomited 2-3 times yesterday and described the vomit as clear. This morning she had 2 episodes of dry heaving. She has been unable to hold down solids since yesterday but has continued drinking liquids. She has felt very fatigued but denies fever, chills, abdominal pain, and diarrhea. Marie Harvey tried Pepto-bismol yesterday with no improvement of symptoms.   Of note, Marie Harvey has lost over 20 lbs this year. She has also struggled with constipation in the last 2 months, going up to 3 weeks between bowel movements. She has had only minor improvement with metamucil and ex-lax use. Marie Harvey endorses chronic loss of appetite. Her last colonoscopy in 2009 was normal. She was recently referred  to GI and has an appointment on Nov 5th.   Review of Systems As mentioned in HPI.     Objective:   Physical Exam  General: Alert and oriented female. Appears unwell but not toxic.  Head and Neck: Moist mucus membranes. Neck supple. No cervical lymphadenopathy appreciated. No scleral icterus or conjunctival pallor. No thyromegaly or thyroid nodules.  CV: RRR. Normal S2. 3/6 systolic ejection murmur. No rubs, or gallops.  Pulm: Clear to auscultation bilaterally with no wheezes, rales, or rhonchi. Abd: Normal bowel sounds, soft, non-tender, non-distended abdomen. No rebound or guarding.  Exam: Alert, mildly ill but in no acute distress.  HEENT Neck supple, no cervical adenopathy, moist mucus membranes. COR Regular S1S2, systolic murmur unchanged from prior exams.  PULM Clear bilat.  ABD Soft, nontender, nondistended.  No organomegaly noted.  No suprapubic or costovertebral angle tenderness. Audible bowel sounds.   JB  Assessment:  This is a 57 yo female who presents with nausea/vomiting for 24 hours. While her symptoms may be d/t acute gastroenteritis, given her history of constipation and unexplained weight loss there is also concern for other etiology including small bowel obstruction or colon cancer. At this time we will treat this as an acute gastroenteritis with low threshold for further workup if symptoms continue.  A/P: Patient with acute N/V, most likely consistent with viral acute gastroenteritis. She is tolerating clears today, does not appear dehydrated at present.  Given this presentation in the setting of unintentional weight loss and changes in bowel habits, her story raises  concerns for more insidious processes.  Given unimpressive abdominal exam today, we have opted for supportive care and oral rehydration, with instructions for KUB should she continue with N/V or if she develops abdominal distention or inability to tolerate orally. In this case, she is to call for consideration of  direct admission to [facility].  Ondansetron as needed for nausea/vomiting, first dose 4mg  sublingual tablet given in office at time of visit. JB  Plan:  1. Nausea and vomiting --CMP to assess metabolic alkalosis --Patient to continue drinking fluids and take bland foods if tolerated.  --Zofran prn for nausea --Abdomen KUB ordered. If patient continues to vomit today she will get x-ray. --Patient will call today if she is unable to hold down liquids or if vomiting continues.  2. Weight loss and constipation --Patient will keep GI appt on Nov 5th

## 2013-01-07 NOTE — Assessment & Plan Note (Signed)
A/P: Patient with acute N/V, most likely consistent with viral acute gastroenteritis. She is tolerating clears today, does not appear dehydrated at present.  Given this presentation in the setting of unintentional weight loss and changes in bowel habits, her story raises concerns for more insidious processes.  Given unimpressive abdominal exam today, we have opted for supportive care and oral rehydration, with instructions for KUB should she continue with N/V or if she develops abdominal distention or inability to tolerate orally. In this case, she is to call for consideration of direct admission to [facility].  Ondansetron as needed for nausea/vomiting, first dose 4mg  sublingual tablet given in office at time of visit.

## 2013-01-08 ENCOUNTER — Telehealth: Payer: Self-pay | Admitting: Family Medicine

## 2013-01-08 NOTE — Telephone Encounter (Signed)
Called patient to report unremarkable lab results; she reports that she is feeling much better this morning and is tolerating clears well.  Used one Zofran tablet last night, but otherwise has been fine.  Reiterated red flags for call-back or KUB/call-back.  Otherwise, to continue with current plan for GI evaluation as scheduled. JB

## 2013-02-19 ENCOUNTER — Encounter: Payer: Self-pay | Admitting: Family Medicine

## 2013-07-01 ENCOUNTER — Encounter: Payer: Self-pay | Admitting: Family Medicine

## 2013-07-01 ENCOUNTER — Ambulatory Visit (INDEPENDENT_AMBULATORY_CARE_PROVIDER_SITE_OTHER): Payer: Managed Care, Other (non HMO) | Admitting: Family Medicine

## 2013-07-01 VITALS — BP 122/78 | HR 60 | Ht 65.0 in | Wt 102.0 lb

## 2013-07-01 DIAGNOSIS — M533 Sacrococcygeal disorders, not elsewhere classified: Secondary | ICD-10-CM

## 2013-07-01 DIAGNOSIS — G4700 Insomnia, unspecified: Secondary | ICD-10-CM

## 2013-07-01 DIAGNOSIS — F339 Major depressive disorder, recurrent, unspecified: Secondary | ICD-10-CM

## 2013-07-01 MED ORDER — TRAZODONE HCL 50 MG PO TABS: 25.0000 mg | ORAL_TABLET | Freq: Every evening | ORAL | Status: DC | PRN

## 2013-07-01 MED ORDER — TRAMADOL HCL 50 MG PO TABS: 50.0000 mg | ORAL_TABLET | Freq: Three times a day (TID) | ORAL | Status: DC | PRN

## 2013-07-01 NOTE — Patient Instructions (Signed)
It was a pleasure to see you today. I am refilling the prescriptions for Tramadol and Trazodone as we discussed.   I agree that you should see an ENT specialist for the pounding sound in your Right ear; my exam today does not find an easily identifiable cause for the sounds.

## 2013-07-02 NOTE — Assessment & Plan Note (Signed)
Denies feelings of depression today; sleep disorder discussed, may be worsened by the chronic stress of her work environment.

## 2013-07-02 NOTE — Progress Notes (Signed)
   Subjective:    Patient ID: Alan Mulder, female    DOB: 04-06-1955, 58 y.o.   MRN: [id number]  HPI Aram Beecham comes in for discussion of insomnia; has trouble staying asleep.  Has used trazodone successfully for this in the past (better than nortriptyline for her).  Goes to sleep around 9pm, awakens around midnight and cannot go back to sleep.  Wakes up at 430am to prepare for work at [employer], which she hates.  She says the patients are abusive and she is looking actively for another job.    Has taken ibuprofen (2 tablets a week) for HA, which are not characterized by photophobia; nausea not a prominent associated symptom.  Has had a 'pounding' sensation in her R ear for awhile now, intends to go to another ENT for evaluation. Does not have ear pain or perception of decreased hearing, no dizziness.   ROS: No longer having nausea spells, no vomiting, no changes in her bowel habits.  No fevers or chills. No headache at time of this visit.    Review of Systems     Objective:   Physical Exam Well appearing, no apparent distress HEENT Neck supple without adenopathy, EOMI.  TMs non-erythematous.  Clear oropharynx. No sinus tenderness over frontal or maxillary sinuses. Thyroid supple.  COR Regular S1S2, no extra sounds PULM Clear bilaterally, no rales or wheezes        Assessment & Plan:

## 2013-07-02 NOTE — Assessment & Plan Note (Signed)
Worsened by stress associated with her work situation. Maintain sleep hygiene; continue Trazodone.

## 2013-07-25 ENCOUNTER — Telehealth: Payer: Self-pay | Admitting: *Deleted

## 2013-07-25 NOTE — Telephone Encounter (Signed)
Received call from [place] of Dr. Mallie Mussel office requesting last office visit.  Pt was seen in there office this AM for clugged// ringing of ear.  Records faxed. Derl Barrow, RN

## 2013-10-28 ENCOUNTER — Encounter (HOSPITAL_COMMUNITY): Payer: Self-pay | Admitting: Emergency Medicine

## 2013-10-28 ENCOUNTER — Emergency Department (INDEPENDENT_AMBULATORY_CARE_PROVIDER_SITE_OTHER)
Admission: EM | Admit: 2013-10-28 | Discharge: 2013-10-28 | Disposition: A | Discharge status: Home / Self Care | Payer: Managed Care, Other (non HMO) | Source: Home / Self Care | Attending: Family Medicine | Admitting: Family Medicine

## 2013-10-28 DIAGNOSIS — R112 Nausea with vomiting, unspecified: Secondary | ICD-10-CM

## 2013-10-28 NOTE — ED Provider Notes (Signed)
CSN: [id number]     Arrival date & time 10/28/13  43 History   First MD Initiated Contact with Patient 10/28/13 1345     Chief Complaint  Patient presents with  . Nausea   (Consider location/radiation/quality/duration/timing/severity/associated sxs/prior Treatment) HPI Comments: 58 year old female presents requesting a note to return to work. For the past 4 days she has had nausea and vomiting, which caused her to miss work yesterday and today. She is feeling significantly better now, but still with some mild nausea. She says this happens occasionally, resulting from her ulcers diagnosed by EGD, she is under the care of a gastroenterologist. She also wonders if she may have caught some sort of virus. She is having normal bowel movements. No fever or abdominal pain outside of her normal abdominal pain. No blood In vomitus. She denies chest pain, shortness of breath, rash, bilious vomiting, palpitations.   History reviewed. No pertinent past medical history. History reviewed. No pertinent past surgical history. History reviewed. No pertinent family history. History  Substance Use Topics  . Smoking status: Never Smoker   . Smokeless tobacco: Never Used  . Alcohol Use: Not on file   OB History   Grav Para Term Preterm Abortions TAB SAB Ect Mult Living                 Review of Systems  Constitutional: Negative for fever and chills.  Eyes: Negative for visual disturbance.  Respiratory: Negative for cough and shortness of breath.   Cardiovascular: Negative for chest pain, palpitations and leg swelling.  Gastrointestinal: Positive for nausea, vomiting and abdominal pain (chronic).  Endocrine: Negative for polydipsia and polyuria.  Genitourinary: Negative for dysuria, urgency and frequency.  Musculoskeletal: Negative for arthralgias and myalgias.  Skin: Negative for rash.  Neurological: Negative for dizziness, weakness and light-headedness.  All other systems reviewed and are  negative.   Allergies  Codeine; Penicillins; Calcium-containing compounds; and Vitamin d analogs  Home Medications   Prior to Admission medications   Medication Sig Start Date End Date Taking? Authorizing Provider  Cholecalciferol (VITAMIN D3) 50000 UNITS CAPS Take 1 capsule by mouth once a week. 09/04/12   Willeen Niece, MD  propranolol (INDERAL) 40 MG tablet Take 1 tablet (40 mg total) by mouth at bedtime. 06/14/10 06/14/11  Willeen Niece, MD  traMADol (ULTRAM) 50 MG tablet Take 1 tablet (50 mg total) by mouth every 8 (eight) hours as needed. 07/01/13   Willeen Niece, MD  traZODone (DESYREL) 50 MG tablet Take 0.5-1 tablets (25-50 mg total) by mouth at bedtime as needed for sleep. 07/01/13   Willeen Niece, MD   BP 139/73  Pulse 68  Temp(Src) 98.1 F (36.7 C) (Oral)  Resp 16  SpO2 100% Physical Exam  Nursing note and vitals reviewed. Constitutional: She is oriented to person, place, and time. Vital signs are normal. No distress.  Extremely thin habitus  HENT:  Head: Normocephalic and atraumatic.  Eyes: Conjunctivae are normal. Right eye exhibits no discharge. Left eye exhibits no discharge.  Cardiovascular: Normal rate and regular rhythm.  Exam reveals no gallop and no friction rub.   Murmur heard.  Systolic murmur is present with a grade of 2/6  Pulmonary/Chest: Effort normal and breath sounds normal. No respiratory distress. She has no wheezes. She has no rales.  Abdominal: Normal appearance and bowel sounds are normal. She exhibits no distension and no mass. There is no hepatosplenomegaly. There is no tenderness. There is no rigidity, no rebound, no  guarding and no CVA tenderness.  Neurological: She is alert and oriented to person, place, and time. She has normal strength. Coordination normal.  Skin: Skin is warm and dry. No rash noted. She is not diaphoretic.  Psychiatric: She has a normal mood and affect. Judgment normal.    ED Course  Procedures (including critical care  time) Labs Review Labs Reviewed - No data to display  Imaging Review No results found.   MDM   1. Nausea and vomiting in adult    She declines a workup and states she is getting better, she will return if worsening, she is requesting a note to return to work, note provided. With a self-limited course of this illness, most likely cause was viral gastritis. Followup as needed    Liam Graham, PA-C 10/28/13 1401  Liam Graham, PA-C 10/28/13 1401

## 2013-10-28 NOTE — Discharge Instructions (Signed)

## 2013-11-03 NOTE — ED Provider Notes (Signed)
Medical screening examination/treatment/procedure(s) were performed by a resident physician or non-physician practitioner and as the supervising physician I was immediately available for consultation/collaboration.  Linna Darner, MD Family Medicine   Waldemar Dickens, MD 11/03/13 2110

## 2013-11-07 ENCOUNTER — Ambulatory Visit: Payer: Managed Care, Other (non HMO) | Admitting: Family Medicine

## 2013-11-11 ENCOUNTER — Other Ambulatory Visit: Payer: Self-pay | Admitting: Family Medicine

## 2013-11-14 ENCOUNTER — Encounter: Payer: Self-pay | Admitting: Family Medicine

## 2013-11-14 ENCOUNTER — Ambulatory Visit (INDEPENDENT_AMBULATORY_CARE_PROVIDER_SITE_OTHER): Payer: Managed Care, Other (non HMO) | Admitting: Family Medicine

## 2013-11-14 VITALS — BP 125/82 | HR 60 | Temp 97.7°F | Ht 65.0 in | Wt 100.6 lb

## 2013-11-14 DIAGNOSIS — R634 Abnormal weight loss: Secondary | ICD-10-CM

## 2013-11-14 DIAGNOSIS — F339 Major depressive disorder, recurrent, unspecified: Secondary | ICD-10-CM

## 2013-11-14 DIAGNOSIS — J329 Chronic sinusitis, unspecified: Secondary | ICD-10-CM

## 2013-11-14 DIAGNOSIS — J309 Allergic rhinitis, unspecified: Secondary | ICD-10-CM

## 2013-11-14 MED ORDER — FLUTICASONE PROPIONATE 50 MCG/ACT NA SUSP: 2.0000 | Freq: Every day | NASAL | Status: AC

## 2013-11-14 MED ORDER — TRAZODONE HCL 50 MG PO TABS: ORAL_TABLET | ORAL | Status: DC

## 2013-11-14 NOTE — Assessment & Plan Note (Signed)
Discussed with patient, who expresses no SI. Her primary stressor is her job. She admits to feeling depressed, but says that a solution to her job situation would remove thoughts of depression.  She is not interested in pharmacotherapy, but is interested in therapy.  I am giving her the contact information for Dr Gwenlyn Saran, with instructions about calling herself to make appointment. Follow up 2 months.

## 2013-11-14 NOTE — Assessment & Plan Note (Addendum)
Patient is concerned; relatively small amount of weight loss, no associated symptoms (no fevers/chills/sweats, N/V). To continue to monitor. Up to date on colon/cervical cancer and breast cancer screening (will be due for next screening mammogram in Sept).

## 2013-11-14 NOTE — Patient Instructions (Signed)
It was a pleasure to see you.   As we discussed,  I recommend that you meet with our psychologist, Dr. Zella Ball, for help dealing with your depression.  You can schedule an appointment with her by calling her directly at [phone].  I advise you to check your weight at home periodically, at the same time each day.  Keep a log and I would like to see you back in the coming 2 months.   For the nasal symptoms, FLONASE nasal spray 2 sprays in each nostril one time every morning.

## 2013-11-14 NOTE — Progress Notes (Signed)
   Subjective:    Patient ID: Marie Harvey, female    DOB: Feb 04, 1956, 58 y.o.   MRN: [id number]  HPI Marie Harvey comes in today for follow up.  She reports that she has been feeling ill on the third day back to work after a weekend/day off; generalized and vague complaint. She works at a SNF and often must Optician, dispensing patients> Work hours from [employer]. About her work, "I hate my job".  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  Applied at a shelter, waiting for response.   Is concerned about her weight loss, says she has a bowl of ice cream when she gets home.   Reports watery rhinorrhea and frontal pain that has been ongoing for 2 weeks.  Takes Allegra without adequate relief.   Marie Harvey reports that she was seen by Dr. Blima Dessert regarding her ears (May, 2015). 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 Review of Systems No fevers or chills, no purulent nasal discharge. No cough, no shortness of breath, no chest pain. No sweats, no N/V/D     Objective:   Physical Exam Generally well appearing, no apparent distress HEENT Neck supple, no cervical adenopathy. No TM erythema bilaterally. No frontal or maxillary sinus tenderness. Nasal mucosa with clear watery rhinorrhea and pale mucosa.  COR Regular S1S2 PULM Clear bilaterally, no rales or wheezes ABD Soft, nontender, nondistended.        Assessment & Plan:

## 2013-11-14 NOTE — Assessment & Plan Note (Signed)
Allergic rhinitis; Flonase nasal spray every morning. Discussed that she may discontinue after ragweed allergy season.

## 2013-11-18 ENCOUNTER — Telehealth: Payer: Self-pay | Admitting: Psychology

## 2013-11-18 NOTE — Telephone Encounter (Signed)
Marie Harvey left a VM yesterday requesting an appointment.  I called her back this a.m. And left a VM.

## 2013-11-20 NOTE — Telephone Encounter (Signed)
[name] left a VM asking that I call her back after 3:00.  I leave the office at 3:00.  Will need to touch base and discuss whether her work schedule will match with my work schedule.  I can call her after 3:00 from home if we can't connect at another time.

## 2013-11-21 NOTE — Telephone Encounter (Signed)
Marie Harvey and I exchanged VMs again.  From her VM, it sounds like she works everyday until 3:00.  I stop working at 3:00 so I am thinking that we will not be able to schedule an appointment.  I called her back and left a VM explaining this and stating that I have someone that I think would be a good match based on what Dr. Lindell Noe said.  I asked her to call me back to discuss or to connect with Dr. Lindell Noe and that I would give him the information.    Marie Harvey is someone that I refer to regularly.  Her phone number is [phone].  If Marie Harvey is interested, I would be happy to let Marie Harvey know to expect her call.  She is located on [address] [place].

## 2013-11-28 NOTE — Telephone Encounter (Signed)
Marie Harvey called and left a VM asking for the recommendation for the therapist.  I recommend Ayesha Rumpf and will send her an email that someone might be calling from [facility] to schedule.  If Si Raider is not a good match or is not available, another possibility is [facility].  They have a number of counselors there and several that may prove to be a good match for Marie Harvey.    [email or web address] Contact via phone or email. Email: [email or web address] Our [place] office is located at: [address], [place] [address] [phone]  I will call Marie Harvey and if I don't reach her, will leave info on her VM and also let her know I forwarded it to Dr. Lindell Noe.

## 2013-12-08 ENCOUNTER — Telehealth: Payer: Self-pay | Admitting: Psychology

## 2013-12-08 NOTE — Telephone Encounter (Signed)
Marie Harvey's mom called to request an appointment for her daughter.  Her daughter and I have exchanged VM several times trying to connect.  Marie Harvey is available for appointments after 3:00 and I stop working at 3:00.  I had left some recommendations for other therapists on Marie Harvey's VM and forwarded them to Dr. Lindell Noe as well.  Marie Harvey's mom states that since Dr. Lindell Noe recommended me, she would like to schedule with me.  I was NOT aware that Marie Harvey is off of work every other Monday (according to her mother).  I scheduled an appointment for Marie Harvey, with Marie Harvey's mom for 10/12 at 1:30.  Marie Harvey, the daughter, can call to cancel if for any reason she does not want to keep the appointment.

## 2013-12-25 ENCOUNTER — Ambulatory Visit (INDEPENDENT_AMBULATORY_CARE_PROVIDER_SITE_OTHER): Payer: Managed Care, Other (non HMO) | Admitting: *Deleted

## 2013-12-25 DIAGNOSIS — Z23 Encounter for immunization: Secondary | ICD-10-CM

## 2013-12-29 ENCOUNTER — Ambulatory Visit (INDEPENDENT_AMBULATORY_CARE_PROVIDER_SITE_OTHER): Payer: Managed Care, Other (non HMO) | Admitting: Psychology

## 2013-12-29 DIAGNOSIS — F4329 Adjustment disorder with other symptoms: Secondary | ICD-10-CM
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
## 2013-12-29 NOTE — Assessment & Plan Note (Addendum)
Marie Harvey is neatly groomed and appropriately dressed.  She maintains good eye contact and is cooperative and attentive.  Speech is a little difficult to understand at times but otherwise normal.  Mood is reported as angry.  Affect is within normal limits.  Thought process is logical and goal directed.  Denied current suicidal ideation.  Does not appear to be responding to any internal stimuli.  Able to maintain train of thought and concentrate on the questions.  Judgment and insight are average to above average.  Her expectations for seeing me were to "get ideas on how to get more involved" and "to have someone on talk to."  I am impressed with her efforts to increase her social interaction.  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  Having people to do things with is high on her list.  I am not sure how she would be able to get that done other than avenues she has already tried including church and social groups.    I can give her someone to talk to and I think pushing certain areas like the relationship with her mom can help tap into emotions that underlie her anger - namely hurt / sadness and fear.  She understood anger as a secondary emotion and readily identified feeling hurt as one reason she feels angry.  We will continue talking about this next visit and hopefully she will have news to report on the animal shelter.  Of note:  She has a cat named Misty and home and that is one thing that is going very well in her life.    Follow-up for:  10/23 at 1:30.

## 2014-01-09 ENCOUNTER — Ambulatory Visit (INDEPENDENT_AMBULATORY_CARE_PROVIDER_SITE_OTHER): Payer: Managed Care, Other (non HMO) | Admitting: Family Medicine

## 2014-01-09 ENCOUNTER — Ambulatory Visit (INDEPENDENT_AMBULATORY_CARE_PROVIDER_SITE_OTHER): Payer: Managed Care, Other (non HMO) | Admitting: Psychology

## 2014-01-09 ENCOUNTER — Encounter: Payer: Self-pay | Admitting: Family Medicine

## 2014-01-09 VITALS — BP 126/90 | HR 64 | Temp 97.8°F | Ht 65.0 in | Wt 102.7 lb

## 2014-01-09 DIAGNOSIS — F4329 Adjustment disorder with other symptoms: Secondary | ICD-10-CM

## 2014-01-09 DIAGNOSIS — M533 Sacrococcygeal disorders, not elsewhere classified: Secondary | ICD-10-CM

## 2014-01-09 MED ORDER — TRAMADOL HCL 50 MG PO TABS: 50.0000 mg | ORAL_TABLET | Freq: Three times a day (TID) | ORAL | Status: DC | PRN

## 2014-01-09 NOTE — Assessment & Plan Note (Addendum)
Upon review of depression (both symtpoms and a broader conceptualization), Marie Harvey has some symptoms of depression currently, but does not meet full criteria.  She believes that if her job situation changed, she would notice a shift in her sadness for the better.  She has plans to pursue something at the animal shelter at [facility].  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  Her social life might improve if she were to find a work situation with people that shared similar interests.  We also discussed the possibility of another church group to see if that might be a better fit.  She has been going to the same church since she was a child so this would be hard to do.  The current demographic at her church, however, is not a good match for her.    She elected to schedule a follow-up on November 20th at 1:30 to continue to chec-in.  I am hopeful that fatigue will not get in the way of her follow through.  If it does, we need to take a closer look at other possible barriers.

## 2014-01-09 NOTE — Progress Notes (Signed)
   Subjective:    Patient ID: Marie Harvey, female    DOB: [birth year], 58 y.o.   MRN: [id number]  HPI [name] is here for follow up of a few issues:   1. Weight; She has been tracking her weights daily, checks in the mornings and brings in a list of daily weights.  Had hit a nadir of 92# in mid September, up to 98# by end-Sept and maxed out at 102# on October 2nd.  This morning her home weight was 98#. She describes her appetite as good, eats three square meals a day with some snacking in between.   2, Sleep has gotten better. Has been taking melatonin 3mg  nightly, as well as the trazodone she had been taking for awhile. Believes her sleep is better, but one time a month she has frequent waking spells and has to use one of her mother's Ambien tablets.    3. 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 She denies depression except for the fact that she is miserable in her job.  Has seen Dr Gwenlyn Saran and has another appointment with her today.   4. Had a health screening at her work: she brings the results today: BMI17; BP 129/68; total cholesterol 178; HDL 59, fasting glucose 58.   5. Continues with low back pain at SI joints bilaterally when she sits in a car for long periods of time.  Tramadol has been helpful for this in the past, would like a refill today.    Review of Systems     Objective:   Physical Exam  Well appearing, no apparent distress.  MIldly less kempt than her usual appearance.  COR S1S2 PULM Clear bilaterally, no rales or wheezes MSK: Mild tenderness along SI joints bilaterally.        Assessment & Plan:    1. Weight loss: no longer losing weight. Denies fever/chills, anorexia. To continue to watch. She is doing a good job at checking her daily weights and keeping tabs.   2. Sleep disorder: seems to be improved as well. I am refilling her trazodone today.  She finds the melatonin helpful.   3. SI joint pain with long sitting periods in car.  Stretching, occasional tramadol if persists. Refill given (printed Rx).   4. ?Situational depression-- to continue to see Dr Gwenlyn Saran for this; patient is averse to the idea of pharmacotherapy for depression. Continues to look for ways to improve her day-to-day life by searching for a better job fit.  Dalbert Mayotte, MD

## 2014-01-09 NOTE — Patient Instructions (Signed)
It was a pleasure to see you.   I am pleased that your weight has leveled off. I do not think we need to do further testing as long as your weight is stable and you are not having other worrisome symptoms.   I am glad you are going to see Dr Gwenlyn Saran this afternoon.   I am refilling the Tramadol for use as-needed for the low back pain that you have on rare occasions.   I would like to see you back just after the start of the New Year (January 2016).

## 2014-01-09 NOTE — Progress Notes (Signed)
Marie Harvey presents for follow-up.  She did not make it to the animal shelter secondary to feeling too fatigued.  Tomorrow is her birthday.  She has to work and she thinks her family may be too busy to celebrate.  Discussed ways she could celebrate herself.  Reviewed the difference between depression and her mind / body's normal reaction to something that needs to change in her life.  She continues to report sadness and fatigue.  Discussed specific symptoms of depression and also the conceptualization of negative view of self, world, and future.    She remains interested (and frustrated) with her lack of social opportunities.

## 2014-01-12 ENCOUNTER — Other Ambulatory Visit: Payer: Self-pay | Admitting: Family Medicine

## 2014-02-06 ENCOUNTER — Ambulatory Visit: Payer: Managed Care, Other (non HMO) | Admitting: Psychology

## 2014-02-20 ENCOUNTER — Ambulatory Visit (INDEPENDENT_AMBULATORY_CARE_PROVIDER_SITE_OTHER): Payer: Managed Care, Other (non HMO) | Admitting: Psychology

## 2014-02-20 DIAGNOSIS — F4329 Adjustment disorder with other symptoms: Secondary | ICD-10-CM

## 2014-02-20 NOTE — Assessment & Plan Note (Signed)
Report of mood remains euthymic.  Affect is consistent.  Able to identify best strategy of "leaving the room" when conflict starts to erupt with her mother.  She goes to her room and pets her cat which calms her down.  Having a plan that she is going to move out seems helpful too although I am not sure how realistic a move away from family is.  She plans to search more earnestly after the holidays.  In the meantime, a shift out of the memory care until seems like it will have a positive effect.  That and going to school will hopefully giver her a renewed sense of hope and purpose.  Will follow in January after she has shifted jobs and school has begun to check-in.  She can call earlier if need be.

## 2014-02-20 NOTE — Progress Notes (Signed)
[name] presents for follow-up.  She reports her mood is manageable with the exception of conflicts with her mother that are long-standing.  She reports she has been searching for jobs and has not been successful.  This is disheartening for her.  She did as for (and was apparently granted) a move from the [place] unit, which was very stressful and physically taxing for her, to the Assisted Living side of things.  This should start early January and she thinks she will like this better.  Plus she thinks she will like the staff / supervisor better here.    She also plans on starting school in January at [employer].  Her mother has been less than supportive of this but [name] states she will attend and will continue to work at least 32 hours.  Her main goal is to get out of the house and if she can't get to [place], she wants to move to [place].  Discussed best strategies for managing her situation currently.

## 2014-04-15 ENCOUNTER — Ambulatory Visit (INDEPENDENT_AMBULATORY_CARE_PROVIDER_SITE_OTHER): Payer: Managed Care, Other (non HMO) | Admitting: Psychology

## 2014-04-15 DIAGNOSIS — F4329 Adjustment disorder with other symptoms: Secondary | ICD-10-CM

## 2014-04-15 NOTE — Progress Notes (Signed)
[name] presents for follow-up.  She was last seen in early December.  She was hoping some changes (different role at work, starting school) would produce some positive feelings but she reports today that this has not been the case.  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  With prompting, she reports she does like her supervisor better and that there is one person for whom she cares that is pleasant but overall, the switch has not been beneficial.  Things remain rough at home with her mom "fussing" at her about multiple things.  They do not see eye-to-eye.  [name] is bored with [facility] and reports she desperately wants to move.  She has applied for different positions around [name] and various kennels and one position at [employer]) with no luck.  Sleep is poor.  Mood is variable.
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
## 2014-04-24 ENCOUNTER — Encounter: Payer: Managed Care, Other (non HMO) | Admitting: Family Medicine

## 2014-04-24 ENCOUNTER — Encounter: Payer: Self-pay | Admitting: Family Medicine

## 2014-04-24 ENCOUNTER — Ambulatory Visit (INDEPENDENT_AMBULATORY_CARE_PROVIDER_SITE_OTHER): Payer: Managed Care, Other (non HMO) | Admitting: Family Medicine

## 2014-04-24 VITALS — BP 140/80 | HR 83 | Temp 97.6°F | Ht 65.0 in | Wt 106.1 lb

## 2014-04-24 DIAGNOSIS — Z131 Encounter for screening for diabetes mellitus: Secondary | ICD-10-CM | POA: Insufficient documentation

## 2014-04-24 DIAGNOSIS — Z13220 Encounter for screening for lipoid disorders: Secondary | ICD-10-CM

## 2014-04-24 DIAGNOSIS — F3340 Major depressive disorder, recurrent, in remission, unspecified: Secondary | ICD-10-CM

## 2014-04-24 DIAGNOSIS — Z862 Personal history of diseases of the blood and blood-forming organs and certain disorders involving the immune mechanism: Secondary | ICD-10-CM

## 2014-04-24 DIAGNOSIS — G4700 Insomnia, unspecified: Secondary | ICD-10-CM

## 2014-04-24 MED ORDER — TRAZODONE HCL 50 MG PO TABS: ORAL_TABLET | ORAL | Status: DC

## 2014-04-24 NOTE — Patient Instructions (Signed)
It was a pleasure to see you.   Remember you are able to walk-in for your screening mammogram.  FASTING LAB[facility] when you are next available, I will notify you either by letter (if normal), or phone [phone]) if we need to discuss.  Increasing Trazodone to 50-100mg  at bedtime, increased dose sent as new prescription to your pharmacy.

## 2014-04-24 NOTE — Progress Notes (Signed)
   Subjective:    Patient ID: Marie Harvey, female    DOB: 03-Aug-1955, 59 y.o.   MRN: [id number]  HPI Here for preventive visit.  She reports she is feeling relatively well. Reviewed all screening indicated and she is up to date with all except screening mammography, is counseled to have this done.    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 She is scheduled to go back to ENT in May of this year.  Continues with humming sound in the ear which complicates her pre-existing problem with insomnia.    Continues with trouble initiating sleep and remaining asleep.  She goes to bed "lights out" at 9pm but may not fall asleep until 11pm or so. Wakes up again at 1am, often cannot go back to sleep.  She is scheduled to get up at 4am to prepare for work.  No naps during the day. Does take a 1/2 tablet of her mother's Ambien from time to time when the sleep problem is really bad. Has taken melatonin with mild improvement. Trazodone 50mg  nightly helps somewhat but not enough to maintain sleep.  Regarding depression, is doing better. Work has improved, and she got a raise.   Social Hx and Family Hx reviewed, no changes.   Review of Systems No fevers or chills, no cough, no shortness of breath, no chest pain, no abd pain, no trouble swallowing. No changes in bowel habits, no blood in stool, no lower extremity swelling.      Objective:   Physical Exam Well-appearing, no apparent distress HEENT Neck supple, no cervical adenopathy. TMs bilaterally intact. Moist mucus membranes.  COR S1S2 with audible murmur.  PULM Clear bilaterally, no rales or wheezes ABD Soft, nontender, nondistended.  MSK FUll active ROM lower extremities and hips bilaterally. Able to get up to exam table and ambulate without assistance.        Assessment & Plan:

## 2014-04-24 NOTE — Assessment & Plan Note (Signed)
Patient reports marked improvement in her depression, which she associates with improvement in her work situation. No SI. She continues in therapy with Dr Gwenlyn Saran.

## 2014-04-24 NOTE — Assessment & Plan Note (Signed)
Maintaining good sleep hygiene. Has had some improvement with trazodone 50mg  nightly. Has tried melatonin otc with mild improvement, but believes she will do better with a step up on the trazodone. Will increase to 75-100mg  at bedtime. Consider Rozerem 8mg  at bedtime if not improving or if not tolerated.

## 2014-05-18 ENCOUNTER — Ambulatory Visit: Payer: Managed Care, Other (non HMO) | Admitting: Psychology

## 2014-05-25 ENCOUNTER — Other Ambulatory Visit: Payer: Managed Care, Other (non HMO)

## 2014-05-25 DIAGNOSIS — Z862 Personal history of diseases of the blood and blood-forming organs and certain disorders involving the immune mechanism: Secondary | ICD-10-CM

## 2014-05-25 DIAGNOSIS — Z13220 Encounter for screening for lipoid disorders: Secondary | ICD-10-CM

## 2014-05-25 DIAGNOSIS — Z131 Encounter for screening for diabetes mellitus: Secondary | ICD-10-CM

## 2014-05-25 LAB — CBC
HCT: 45.5 % (ref 36.0–46.0)
Hemoglobin: 15.9 g/dL — ABNORMAL HIGH (ref 12.0–15.0)
MCH: 31.2 pg (ref 26.0–34.0)
MCHC: 34.9 g/dL (ref 30.0–36.0)
MCV: 89.4 fL (ref 78.0–100.0)
MPV: 11.1 fL (ref 8.6–12.4)
Platelets: 158 10*3/uL (ref 150–400)
RBC: 5.09 MIL/uL (ref 3.87–5.11)
RDW: 13.1 % (ref 11.5–15.5)
WBC: 4.8 10*3/uL (ref 4.0–10.5)

## 2014-05-25 LAB — BASIC METABOLIC PANEL
BUN: 16 mg/dL (ref 6–23)
CO2: 32 mEq/L (ref 19–32)
Calcium: 9.5 mg/dL (ref 8.4–10.5)
Chloride: 102 mEq/L (ref 96–112)
Creat: 0.7 mg/dL (ref 0.50–1.10)
Glucose, Bld: 87 mg/dL (ref 70–99)
Potassium: 4.5 mEq/L (ref 3.5–5.3)
Sodium: 139 mEq/L (ref 135–145)

## 2014-05-25 LAB — LIPID PANEL
Cholesterol: 182 mg/dL (ref 0–200)
HDL: 69 mg/dL (ref >=46)
LDL Cholesterol: 100 mg/dL — ABNORMAL HIGH (ref 0–99)
Total CHOL/HDL Ratio: 2.6 Ratio
Triglycerides: 67 mg/dL (ref <150)
VLDL: 13 mg/dL (ref 0–40)

## 2014-05-25 NOTE — Progress Notes (Signed)
[facility] phlebotomist drew:  BMP, LIPID, CBC

## 2014-05-27 ENCOUNTER — Encounter: Payer: Self-pay | Admitting: Family Medicine

## 2014-09-13 ENCOUNTER — Other Ambulatory Visit: Payer: Self-pay | Admitting: Family Medicine

## 2014-09-15 ENCOUNTER — Other Ambulatory Visit: Payer: Self-pay | Admitting: Family Medicine

## 2014-09-15 MED ORDER — TRAMADOL HCL 50 MG PO TABS: 50.0000 mg | ORAL_TABLET | Freq: Three times a day (TID) | ORAL | Status: DC | PRN

## 2014-10-02 ENCOUNTER — Ambulatory Visit (INDEPENDENT_AMBULATORY_CARE_PROVIDER_SITE_OTHER): Payer: Managed Care, Other (non HMO) | Admitting: Family Medicine

## 2014-10-02 ENCOUNTER — Encounter: Payer: Self-pay | Admitting: Family Medicine

## 2014-10-02 VITALS — BP 146/70 | HR 56 | Temp 97.9°F | Ht 65.0 in | Wt 98.0 lb

## 2014-10-02 DIAGNOSIS — J329 Chronic sinusitis, unspecified: Secondary | ICD-10-CM

## 2014-10-02 DIAGNOSIS — R634 Abnormal weight loss: Secondary | ICD-10-CM

## 2014-10-02 DIAGNOSIS — R591 Generalized enlarged lymph nodes: Secondary | ICD-10-CM | POA: Diagnosis not present

## 2014-10-02 LAB — CBC WITH DIFFERENTIAL/PLATELET
Basophils Absolute: 0 10*3/uL (ref 0.0–0.1)
Basophils Relative: 0 % (ref 0–1)
Eosinophils Absolute: 0 10*3/uL (ref 0.0–0.7)
Eosinophils Relative: 1 % (ref 0–5)
HCT: 43 % (ref 36.0–46.0)
Hemoglobin: 15 g/dL (ref 12.0–15.0)
Lymphocytes Relative: 19 % (ref 12–46)
Lymphs Abs: 0.9 10*3/uL (ref 0.7–4.0)
MCH: 30.5 pg (ref 26.0–34.0)
MCHC: 34.9 g/dL (ref 30.0–36.0)
MCV: 87.4 fL (ref 78.0–100.0)
MPV: 10.8 fL (ref 8.6–12.4)
Monocytes Absolute: 0.3 10*3/uL (ref 0.1–1.0)
Monocytes Relative: 6 % (ref 3–12)
Neutro Abs: 3.6 10*3/uL (ref 1.7–7.7)
Neutrophils Relative %: 74 % (ref 43–77)
Platelets: 148 10*3/uL — ABNORMAL LOW (ref 150–400)
RBC: 4.92 MIL/uL (ref 3.87–5.11)
RDW: 13.6 % (ref 11.5–15.5)
WBC: 4.8 10*3/uL (ref 4.0–10.5)

## 2014-10-02 LAB — COMPREHENSIVE METABOLIC PANEL
ALT: 14 U/L (ref 0–35)
AST: 16 U/L (ref 0–37)
Albumin: 4.2 g/dL (ref 3.5–5.2)
Alkaline Phosphatase: 73 U/L (ref 39–117)
BUN: 14 mg/dL (ref 6–23)
CO2: 31 mEq/L (ref 19–32)
Calcium: 9.2 mg/dL (ref 8.4–10.5)
Chloride: 104 mEq/L (ref 96–112)
Creat: 0.66 mg/dL (ref 0.50–1.10)
Glucose, Bld: 87 mg/dL (ref 70–99)
Potassium: 4 mEq/L (ref 3.5–5.3)
Sodium: 143 mEq/L (ref 135–145)
Total Bilirubin: 0.9 mg/dL (ref 0.2–1.2)
Total Protein: 6.3 g/dL (ref 6.0–8.3)

## 2014-10-02 LAB — T4, FREE: Free T4: 1.04 ng/dL (ref 0.80–1.80)

## 2014-10-02 LAB — TSH: TSH: 0.705 u[IU]/mL (ref 0.350–4.500)

## 2014-10-02 LAB — T3: T3, Total: 89.4 ng/dL (ref 80.0–204.0)

## 2014-10-02 NOTE — Assessment & Plan Note (Signed)
Pt c/o L facial pain when rubbing face x 3 days. Face nontender today. Afebrile. Doubt acute sinusitis at this time. Will just observe, pt to return if worsens.

## 2014-10-02 NOTE — Progress Notes (Signed)
Patient ID: Marie Harvey, female   DOB: 07-25-1955, 59 y.o.   MRN: [id number]  HPI:  Pt presents to meet new PCP. We also discussed the following concerns:  L facial pain - has noticed pain in L cheek when she rubs her face for a few days. Has hx of chronic sinusitis and requires antibiotic therapy about once per year. Last took abx for this in March. No fevers. Always feels congested in her nose, this isn't new. Only has the pain when she rubs the L side of her face.   Weight loss - mentioned by patient in passing. 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 Takes tramadol as needed for back pain as a result of her job. Stays active but does not seem to be trying to lose weight.   ROS: See HPI.  [place]: hx adjustment disorder, congenital tetralogy of fallot s/p repair, scoliosis, esophageal stricture, GERD, polycythemia, chronic sinusitis  PHYSICAL EXAM: BP 146/70 mmHg  Pulse 56  Temp(Src) 97.9 F (36.6 C) (Oral)  Ht 5\' 5"  (1.651 m)  Wt 98 lb (44.453 kg)  BMI 16.31 kg/m2 Gen: NAD HEENT: NCAT. R enlarged mobile cervical nodule on anteriolateral side, most likely lymph node. nontender to palpation. About 3cm in diameter. TM's clear bilaterally. Oropharynx clear (scarring noted from prior cleft palate repair). Nares with clear drainage. No sinus tenderness to palpation on face. No skin tenderness to palpation. Lymph: no axillary or inguinal lymphadenopathy appreciated Heart: RRR, murmur present Lungs: CTAB NWOB Abd: thin, soft, nontender to palpation Neuro: grossly nonfocal, speech slightly slurred at baseline, intelligible Ext: atraumatic  ASSESSMENT/PLAN:  Health maintenance:  -given handouts on mammogram and shingles vaccinations  Sinusitis, chronic Pt c/o L facial pain when rubbing face x 3 days. Face nontender today. Afebrile. Doubt acute sinusitis at this time. Will just observe, pt to return if worsens.  Loss of weight BMI 16.3, with unintentional weight loss  recently (down 8lb since February). UTD on cancer screenings except for mammogram, will give handout today on how to schedule this. I did notice a mobile nodule on her R neck, which may be a lymph node but also could be a thyroid nodule or submandibular gland. Will proceed with workup: CBC with diff, CMET, TSH, free T4, T3, and peripheral smear. Pt to f/u in 3-4 weeks for reexamination of neck and recheck of weight.  Lymphadenopathy On exam today, noted mobile nodule on her R neck, which may be a lymph node but also could be a thyroid nodule or submandibular gland. Concerning in light of unintentional weight loss. Will proceed with workup: CBC with diff, CMET, TSH, free T4, T3, and peripheral smear. Pt to f/u in 3-4 weeks for reexamination of neck and recheck of weight.   FOLLOW UP: F/u in 3-4 weeks for recheck weight & lymph node.  [place]. Ardelia Mems, [place]

## 2014-10-02 NOTE — Assessment & Plan Note (Signed)
On exam today, noted mobile nodule on her R neck, which may be a lymph node but also could be a thyroid nodule or submandibular gland. Concerning in light of unintentional weight loss. Will proceed with workup: CBC with diff, CMET, TSH, free T4, T3, and peripheral smear. Pt to f/u in 3-4 weeks for reexamination of neck and recheck of weight.

## 2014-10-02 NOTE — Patient Instructions (Signed)
Checking labs today I don't think you have a sinus infection Follow up with me in 3-4 weeks to recheck your weight and re-examine the lymph node Return sooner if you notice any changes or problems  See handouts on mammogram and shingles vaccine  Be well, Dr. Ardelia Mems

## 2014-10-02 NOTE — Assessment & Plan Note (Signed)
BMI 16.3, with unintentional weight loss recently (down 8lb since February). UTD on cancer screenings except for mammogram, will give handout today on how to schedule this. I did notice a mobile nodule on her R neck, which may be a lymph node but also could be a thyroid nodule or submandibular gland. Will proceed with workup: CBC with diff, CMET, TSH, free T4, T3, and peripheral smear. Pt to f/u in 3-4 weeks for reexamination of neck and recheck of weight.

## 2014-10-05 ENCOUNTER — Encounter: Payer: Self-pay | Admitting: Family Medicine

## 2014-10-05 LAB — PATHOLOGIST SMEAR REVIEW

## 2014-10-07 ENCOUNTER — Other Ambulatory Visit: Payer: Self-pay

## 2014-10-07 DIAGNOSIS — Z1231 Encounter for screening mammogram for malignant neoplasm of breast: Secondary | ICD-10-CM

## 2014-10-23 ENCOUNTER — Ambulatory Visit
Admission: RE | Admit: 2014-10-23 | Discharge: 2014-10-23 | Disposition: A | Discharge status: Home / Self Care | Payer: Managed Care, Other (non HMO) | Source: Ambulatory Visit

## 2014-10-23 DIAGNOSIS — Z1231 Encounter for screening mammogram for malignant neoplasm of breast: Secondary | ICD-10-CM

## 2014-11-11 ENCOUNTER — Encounter: Payer: Self-pay | Admitting: Family Medicine

## 2014-11-11 ENCOUNTER — Ambulatory Visit (INDEPENDENT_AMBULATORY_CARE_PROVIDER_SITE_OTHER): Payer: Managed Care, Other (non HMO) | Admitting: Family Medicine

## 2014-11-11 VITALS — BP 118/75 | HR 57 | Temp 97.9°F | Ht 65.0 in | Wt 100.0 lb

## 2014-11-11 DIAGNOSIS — H7392 Unspecified disorder of tympanic membrane, left ear: Secondary | ICD-10-CM

## 2014-11-11 DIAGNOSIS — G4700 Insomnia, unspecified: Secondary | ICD-10-CM | POA: Diagnosis not present

## 2014-11-11 DIAGNOSIS — R591 Generalized enlarged lymph nodes: Secondary | ICD-10-CM

## 2014-11-11 DIAGNOSIS — R634 Abnormal weight loss: Secondary | ICD-10-CM | POA: Diagnosis not present

## 2014-11-11 NOTE — Progress Notes (Signed)
Patient ID: Marie Harvey, female   DOB: Oct 30, 1955, 59 y.o.   MRN: [id number]  HPI:  Weight loss: weight up 2 lb since last visit. Works as Quarry manager. Stays active. Sees GYN for pap smears and reports these are up to date. Planning to get next one early next year. No night sweats or cough. Inquired about pap smear - patient reports sees GYN and these have always been normal.   Sleep trouble: trazodone only works 50-60% of time. Difficulty with both falling asleep and staying asleep. Avoiding stimulants in evening but does enjoy soda. Has never been taught sleep hygiene principles  ROS: See HPI.  [place]: hx adjustment disorder, congenital tetralogy of fallot s/p repair, scoliosis, esophageal stricture, GERD, polycythemia, chronic sinusitis  PHYSICAL EXAM: BP 118/75 mmHg  Pulse 57  Temp(Src) 97.9 F (36.6 C) (Oral)  Ht 5\' 5"  (1.651 m)  Wt 100 lb (45.36 kg)  BMI 16.64 kg/m2 Gen: NAD, pleasant, cooperative HEENT: NCAT, MMM, oropharynx clear, palate scarring present from cleft palate repair. R TM normal, L TM with off-white nodule present in superior portion. + enlarged lymph node on R neck remains, which is nontender and mobile and measures 2-3 cm in diameter. No supraclavicular lymphadenopathy appreciated. Heart: RRR, murmur present Lungs: CTAB NWOB Neuro: grossly nonfocal, speech normal Ext: No appreciable lower extremity edema bilaterally   ASSESSMENT/PLAN:  Loss of weight Weight stable, increased slightly from last visit. Continues to have some cervical lymphadenopathy for which I am referring her to ENT today. Would consider biopsy.  Lymphadenopathy Persistent LAD noted on R neck. Did note nodule to L TM. Pt reports having CT or MRI done in the past to evaluate possible tumor on R ear in the past. Given the combination of lymphadenopathy, tympanic nodule, and weight loss, will refer to ENT for further evaluation. Pt agreeable to this plan.  Insomnia Continues. Given handout on sleep  hygiene. Continue trazodone.   FOLLOW UP: F/u in 6 weeks for weight recheck Referring to ENT.  [place]. Ardelia Mems, [place]

## 2014-11-11 NOTE — Patient Instructions (Addendum)
See handout on sleep hygiene Referring you to Dr. Ernesto Rutherford at ENT for your ear drum and lymph node Follow up with me in 6 weeks to recheck your weight  Be well, Dr. Ardelia Mems

## 2014-11-15 NOTE — Assessment & Plan Note (Signed)
Weight stable, increased slightly from last visit. Continues to have some cervical lymphadenopathy for which I am referring her to ENT today. Would consider biopsy.

## 2014-11-15 NOTE — Assessment & Plan Note (Signed)
Persistent LAD noted on R neck. Did note nodule to L TM. Pt reports having CT or MRI done in the past to evaluate possible tumor on R ear in the past. Given the combination of lymphadenopathy, tympanic nodule, and weight loss, will refer to ENT for further evaluation. Pt agreeable to this plan.

## 2014-11-15 NOTE — Assessment & Plan Note (Signed)
Continues. Given handout on sleep hygiene. Continue trazodone.

## 2014-11-17 ENCOUNTER — Telehealth: Payer: Self-pay | Admitting: *Deleted

## 2014-11-17 NOTE — Telephone Encounter (Signed)
[facility] has been trying to contact patient for an appointment, but patient is not returning their phone call.  They have sent her a letter to contact them. Derl Barrow, RN

## 2015-01-05 ENCOUNTER — Other Ambulatory Visit: Payer: Self-pay | Admitting: Family Medicine

## 2015-03-08 ENCOUNTER — Other Ambulatory Visit: Payer: Self-pay | Admitting: Family Medicine

## 2015-04-13 ENCOUNTER — Other Ambulatory Visit: Payer: Self-pay | Admitting: Family Medicine

## 2015-05-03 ENCOUNTER — Ambulatory Visit (INDEPENDENT_AMBULATORY_CARE_PROVIDER_SITE_OTHER): Payer: Managed Care, Other (non HMO) | Admitting: Family Medicine

## 2015-05-03 ENCOUNTER — Encounter: Payer: Self-pay | Admitting: Family Medicine

## 2015-05-03 VITALS — BP 140/76 | HR 56 | Temp 97.7°F | Ht 65.0 in | Wt 102.3 lb

## 2015-05-03 DIAGNOSIS — G8929 Other chronic pain: Secondary | ICD-10-CM

## 2015-05-03 DIAGNOSIS — Z23 Encounter for immunization: Secondary | ICD-10-CM

## 2015-05-03 DIAGNOSIS — Z1159 Encounter for screening for other viral diseases: Secondary | ICD-10-CM

## 2015-05-03 DIAGNOSIS — G4700 Insomnia, unspecified: Secondary | ICD-10-CM

## 2015-05-03 DIAGNOSIS — Z0000 Encounter for general adult medical examination without abnormal findings: Secondary | ICD-10-CM

## 2015-05-03 DIAGNOSIS — R1900 Intra-abdominal and pelvic swelling, mass and lump, unspecified site: Secondary | ICD-10-CM | POA: Diagnosis not present

## 2015-05-03 DIAGNOSIS — M545 Low back pain: Secondary | ICD-10-CM

## 2015-05-03 LAB — CBC WITH DIFFERENTIAL/PLATELET
Basophils Absolute: 0 10*3/uL (ref 0.0–0.1)
Basophils Relative: 1 % (ref 0–1)
Eosinophils Absolute: 0.1 10*3/uL (ref 0.0–0.7)
Eosinophils Relative: 3 % (ref 0–5)
HCT: 44.8 % (ref 36.0–46.0)
Hemoglobin: 15.1 g/dL — ABNORMAL HIGH (ref 12.0–15.0)
Lymphocytes Relative: 41 % (ref 12–46)
Lymphs Abs: 1.6 10*3/uL (ref 0.7–4.0)
MCH: 30 pg (ref 26.0–34.0)
MCHC: 33.7 g/dL (ref 30.0–36.0)
MCV: 89.1 fL (ref 78.0–100.0)
MPV: 11 fL (ref 8.6–12.4)
Monocytes Absolute: 0.4 10*3/uL (ref 0.1–1.0)
Monocytes Relative: 10 % (ref 3–12)
Neutro Abs: 1.7 10*3/uL (ref 1.7–7.7)
Neutrophils Relative %: 45 % (ref 43–77)
Platelets: 147 10*3/uL — ABNORMAL LOW (ref 150–400)
RBC: 5.03 MIL/uL (ref 3.87–5.11)
RDW: 12.8 % (ref 11.5–15.5)
WBC: 3.8 10*3/uL — ABNORMAL LOW (ref 4.0–10.5)

## 2015-05-03 LAB — COMPLETE METABOLIC PANEL WITH GFR
ALT: 15 U/L (ref 6–29)
AST: 18 U/L (ref 10–35)
Albumin: 4.4 g/dL (ref 3.6–5.1)
Alkaline Phosphatase: 80 U/L (ref 33–130)
BUN: 18 mg/dL (ref 7–25)
CO2: 27 mmol/L (ref 20–31)
Calcium: 9.5 mg/dL (ref 8.6–10.4)
Chloride: 101 mmol/L (ref 98–110)
Creat: 0.76 mg/dL (ref 0.50–0.99)
GFR, Est African American: >89 mL/min (ref >=60)
GFR, Est Non African American: 85 mL/min (ref >=60)
Glucose, Bld: 45 mg/dL — ABNORMAL LOW (ref 65–99)
Potassium: 4.5 mmol/L (ref 3.5–5.3)
Sodium: 140 mmol/L (ref 135–146)
Total Bilirubin: 1 mg/dL (ref 0.2–1.2)
Total Protein: 6.8 g/dL (ref 6.1–8.1)

## 2015-05-03 MED ORDER — ZOSTER VACCINE LIVE 19400 UNT/0.65ML ~~LOC~~ SOLR: 0.6500 mL | Freq: Once | SUBCUTANEOUS | Status: DC

## 2015-05-03 NOTE — Patient Instructions (Signed)
Please schedule a visit with your gynecologist to talk about the pelvic lump you felt It's important we look into this. Checking some labwork today, also recommend getting at least an ultrasound.  See handout & prescription for shingles vaccine - take to your pharmacy Checking hepatitis C test today  See me in about 6 months, sooner if needed  Be well, Dr. Ardelia Mems   Menopause is a normal process in which your reproductive ability comes to an end. This process happens gradually over a span of months to years, usually between the ages of 45 and 97. Menopause is complete when you have missed 12 consecutive menstrual periods. It is important to talk with your health care provider about some of the most common conditions that affect postmenopausal women, such as heart disease, cancer, and bone loss (osteoporosis). Adopting a healthy lifestyle and getting preventive care can help to promote your health and wellness. Those actions can also lower your chances of developing some of these common conditions. WHAT SHOULD I KNOW ABOUT MENOPAUSE? During menopause, you may experience a number of symptoms, such as:  Moderate-to-severe hot flashes.  Night sweats.  Decrease in sex drive.  Mood swings.  Headaches.  Tiredness.  Irritability.  Memory problems.  Insomnia. Choosing to treat or not to treat menopausal changes is an individual decision that you make with your health care provider. WHAT SHOULD I KNOW ABOUT HORMONE REPLACEMENT THERAPY AND SUPPLEMENTS? Hormone therapy products are effective for treating symptoms that are associated with menopause, such as hot flashes and night sweats. Hormone replacement carries certain risks, especially as you become older. If you are thinking about using estrogen or estrogen with progestin treatments, discuss the benefits and risks with your health care provider. WHAT SHOULD I KNOW ABOUT HEART DISEASE AND STROKE? Heart disease, heart attack, and stroke  become more likely as you age. This may be due, in part, to the hormonal changes that your body experiences during menopause. These can affect how your body processes dietary fats, triglycerides, and cholesterol. Heart attack and stroke are both medical emergencies. There are many things that you can do to help prevent heart disease and stroke:  Have your blood pressure checked at least every 1-2 years. High blood pressure causes heart disease and increases the risk of stroke.  If you are 56-7 years old, ask your health care provider if you should take aspirin to prevent a heart attack or a stroke.  Do not use any tobacco products, including cigarettes, chewing tobacco, or electronic cigarettes. If you need help quitting, ask your health care provider.  It is important to eat a healthy diet and maintain a healthy weight.  Be sure to include plenty of vegetables, fruits, low-fat dairy products, and lean protein.  Avoid eating foods that are high in solid fats, added sugars, or salt (sodium).  Get regular exercise. This is one of the most important things that you can do for your health.  Try to exercise for at least 150 minutes each week. The type of exercise that you do should increase your heart rate and make you sweat. This is known as moderate-intensity exercise.  Try to do strengthening exercises at least twice each week. Do these in addition to the moderate-intensity exercise.  Know your numbers.Ask your health care provider to check your cholesterol and your blood glucose. Continue to have your blood tested as directed by your health care provider. WHAT SHOULD I KNOW ABOUT CANCER SCREENING? There are several types of cancer. Take  the following steps to reduce your risk and to catch any cancer development as early as possible. Breast Cancer  Practice breast self-awareness.  This means understanding how your breasts normally appear and feel.  It also means doing regular breast  self-exams. Let your health care provider know about any changes, no matter how small.  If you are 71 or older, have a clinician do a breast exam (clinical breast exam or CBE) every year. Depending on your age, family history, and medical history, it may be recommended that you also have a yearly breast X-ray (mammogram).  If you have a family history of breast cancer, talk with your health care provider about genetic screening.  If you are at high risk for breast cancer, talk with your health care provider about having an MRI and a mammogram every year.  Breast cancer (BRCA) gene test is recommended for women who have family members with BRCA-related cancers. Results of the assessment will determine the need for genetic counseling and BRCA1 and for BRCA2 testing. BRCA-related cancers include these types:  Breast. This occurs in males or females.  Ovarian.  Tubal. This may also be called fallopian tube cancer.  Cancer of the abdominal or pelvic lining (peritoneal cancer).  Prostate.  Pancreatic. Cervical, Uterine, and Ovarian Cancer Your health care provider may recommend that you be screened regularly for cancer of the pelvic organs. These include your ovaries, uterus, and vagina. This screening involves a pelvic exam, which includes checking for microscopic changes to the surface of your cervix (Pap test).  For women ages 21-65, health care providers may recommend a pelvic exam and a Pap test every three years. For women ages 53-65, they may recommend the Pap test and pelvic exam, combined with testing for human papilloma virus (HPV), every five years. Some types of HPV increase your risk of cervical cancer. Testing for HPV may also be done on women of any age who have unclear Pap test results.  Other health care providers may not recommend any screening for nonpregnant women who are considered low risk for pelvic cancer and have no symptoms. Ask your health care provider if a screening  pelvic exam is right for you.  If you have had past treatment for cervical cancer or a condition that could lead to cancer, you need Pap tests and screening for cancer for at least 20 years after your treatment. If Pap tests have been discontinued for you, your risk factors (such as having a new sexual partner) need to be reassessed to determine if you should start having screenings again. Some women have medical problems that increase the chance of getting cervical cancer. In these cases, your health care provider may recommend that you have screening and Pap tests more often.  If you have a family history of uterine cancer or ovarian cancer, talk with your health care provider about genetic screening.  If you have vaginal bleeding after reaching menopause, tell your health care provider.  There are currently no reliable tests available to screen for ovarian cancer. Lung Cancer Lung cancer screening is recommended for adults 33-36 years old who are at high risk for lung cancer because of a history of smoking. A yearly low-dose CT scan of the lungs is recommended if you:  Currently smoke.  Have a history of at least 30 pack-years of smoking and you currently smoke or have quit within the past 15 years. A pack-year is smoking an average of one pack of cigarettes per day for  one year. Yearly screening should:  Continue until it has been 15 years since you quit.  Stop if you develop a health problem that would prevent you from having lung cancer treatment. Colorectal Cancer  This type of cancer can be detected and can often be prevented.  Routine colorectal cancer screening usually begins at age 49 and continues through age 68.  If you have risk factors for colon cancer, your health care provider may recommend that you be screened at an earlier age.  If you have a family history of colorectal cancer, talk with your health care provider about genetic screening.  Your health care provider  may also recommend using home test kits to check for hidden blood in your stool.  A small camera at the end of a tube can be used to examine your colon directly (sigmoidoscopy or colonoscopy). This is done to check for the earliest forms of colorectal cancer.  Direct examination of the colon should be repeated every 5-10 years until age 39. However, if early forms of precancerous polyps or small growths are found or if you have a family history or genetic risk for colorectal cancer, you may need to be screened more often. Skin Cancer  Check your skin from head to toe regularly.  Monitor any moles. Be sure to tell your health care provider:  About any new moles or changes in moles, especially if there is a change in a mole's shape or color.  If you have a mole that is larger than the size of a pencil eraser.  If any of your family members has a history of skin cancer, especially at a young age, talk with your health care provider about genetic screening.  Always use sunscreen. Apply sunscreen liberally and repeatedly throughout the day.  Whenever you are outside, protect yourself by wearing long sleeves, pants, a wide-brimmed hat, and sunglasses. WHAT SHOULD I KNOW ABOUT OSTEOPOROSIS? Osteoporosis is a condition in which bone destruction happens more quickly than new bone creation. After menopause, you may be at an increased risk for osteoporosis. To help prevent osteoporosis or the bone fractures that can happen because of osteoporosis, the following is recommended:  If you are 23-90 years old, get at least 1,000 mg of calcium and at least 600 mg of vitamin D per day.  If you are older than age 19 but younger than age 42, get at least 1,200 mg of calcium and at least 600 mg of vitamin D per day.  If you are older than age 59, get at least 1,200 mg of calcium and at least 800 mg of vitamin D per day. Smoking and excessive alcohol intake increase the risk of osteoporosis. Eat foods that are  rich in calcium and vitamin D, and do weight-bearing exercises several times each week as directed by your health care provider. WHAT SHOULD I KNOW ABOUT HOW MENOPAUSE AFFECTS [place]? Depression may occur at any age, but it is more common as you become older. Common symptoms of depression include:  Low or sad mood.  Changes in sleep patterns.  Changes in appetite or eating patterns.  Feeling an overall lack of motivation or enjoyment of activities that you previously enjoyed.  Frequent crying spells. Talk with your health care provider if you think that you are experiencing depression. WHAT SHOULD I KNOW ABOUT IMMUNIZATIONS? It is important that you get and maintain your immunizations. These include:  Tetanus, diphtheria, and pertussis (Tdap) booster vaccine.  Influenza every year before  the flu season begins.  Pneumonia vaccine.  Shingles vaccine. Your health care provider may also recommend other immunizations.   This information is not intended to replace advice given to you by your health care provider. Make sure you discuss any questions you have with your health care provider.   Document Released: 04/28/2005 Document Revised: 03/27/2014 Document Reviewed: 11/06/2013 Elsevier Interactive Patient Education [employer].

## 2015-05-03 NOTE — Progress Notes (Signed)
Date of Visit: 05/03/2015   HPI:  Patient presents today for a well woman exam.   Concerns today: see below Periods: no periods since 2008 Contraception: postmenopausal Pelvic symptoms: lump, see below Sexual activity: not sexually active STD Screening: no concerns Pap smear status: utd, sees GYN for this Exercise: busy at work, on feet Diet: eating well Smoking: no prior regular smoking Alcohol: no Drugs: no Advance directives: would not want long term life support, max would be 3 months.  Mood: good, no SI/HI Dentist: has dentist, last went August 2016  Back pain - chronic and unchanged. due to heavy lifting at work. Has history of scoliosis. Better with tramadol & heating pad. Denies having fever, saddle anesthesia, lower extremity weakness, or problems with stooling or urination. Only gets pain on days she does heavy lifting. Takes tramadol every day that she works. Has never had xrays and does not want any right now, due to chronic stable nature of her pain.  Sleep - taking 2 tabs of trazodone at night to help with sleep. Working well.   Pelvic mass - has noticed lump in left pelvic area. Noticed last month. Denies fevers, weight loss (up 2 lb since last visit). No pelvic pain or vaginal discharge. Sees GYN for paps. Wants to follow up with GYN for this, declines pelvic exam today. Note I had previously seen her back in July for weight loss & found an enlarged lymph node in cervical area and abnormality of L TM. Referred her to ENT. No records available, but patient reports she was seen by ENT and they did not want to do any biopsies or further workup.  ROS: See HPI  [place]:  Cancers in family: mom had uterine cancer, otherwise no cancers in family  PHYSICAL EXAM: BP 140/76 mmHg  Pulse 56  Temp(Src) 97.7 F (36.5 C) (Oral)  Ht 5\' 5"  (1.651 m)  Wt 102 lb 4.8 oz (46.403 kg)  BMI 17.02 kg/m2 Gen: NAD, pleasant, cooperative HEENT: NCAT, moist mucous membranes. Status post  cleft palate repair  Heart: RRR, murmur present Lungs: CTAB, NWOB Abdomen: soft, nontender to palpation. Poorly defined area of fullness/possible mass in LLQ/left pelvis. nontender to palpation. Lymph: no appreciated anterior cervical or supraclavicular lymphadenopathy. No axillary lymphadenopathy. No inguinal lymphadenopathy. Neuro: grossly nonfocal, speech normal GU: patient declined pelvic exam Extremities: No appreciable lower extremity edema bilaterally   ASSESSMENT/PLAN:  # Health maintenance:  -pap smear: will see GYN, reports being UTD -mammogram: UTD -immunizations: given rx & handout for shingles vaccine -hep C screening test today with labs -advance directives: discussed with patient, would not want long term (>3 months) of life support -handout given on health maintenance topics  Pelvic mass in female Noticed by patient last month. Unable to fully evaluate today as patient did not want pelvic exam. Discussed importance of evaluating pelvic/abdominal mass, especially in light of her low BMI and prior findings of lymphadenopathy (resolved today, fortunately). She prefers to see her gynecologist for this. Explained need for likely ultrasound of pelvis versus CT scan.  - Patient will see GYN & have that provider order ultrasound to start workup. - check CBC with diff & CMET today as basic labwork. - encouraged patient to follow up here with me even if u/s is normal, as this will need follow up and thorough evaluation.  Lumbago Chronic and stable, related to heavy lifting at work and congenital scoliosis. Doing well on tramadol daily, also heating pad. Will refill tramadol as needed. Offered xray  to patient but she declined as her pain is clearly invoked by lifting at work and is unchanged.  Insomnia Doing well on trazodone 100mg  per night. Continue this medication.    FOLLOW UP: Follow up in 6 months with me for routine medical care To see GYN for pelvic mass - encouraged to  follow up with me for this as well.  [place]. Ardelia Mems, [place]

## 2015-05-03 NOTE — Assessment & Plan Note (Signed)
Doing well on trazodone 100mg  per night. Continue this medication.

## 2015-05-03 NOTE — Assessment & Plan Note (Signed)
Chronic and stable, related to heavy lifting at work and congenital scoliosis. Doing well on tramadol daily, also heating pad. Will refill tramadol as needed. Offered xray to patient but she declined as her pain is clearly invoked by lifting at work and is unchanged.

## 2015-05-03 NOTE — Assessment & Plan Note (Signed)
Noticed by patient last month. Unable to fully evaluate today as patient did not want pelvic exam. Discussed importance of evaluating pelvic/abdominal mass, especially in light of her low BMI and prior findings of lymphadenopathy (resolved today, fortunately). She prefers to see her gynecologist for this. Explained need for likely ultrasound of pelvis versus CT scan.  - Patient will see GYN & have that provider order ultrasound to start workup. - check CBC with diff & CMET today as basic labwork. - encouraged patient to follow up here with me even if u/s is normal, as this will need follow up and thorough evaluation.

## 2015-05-04 LAB — HEPATITIS C ANTIBODY: HCV Ab: NEGATIVE

## 2015-05-20 ENCOUNTER — Telehealth: Payer: Self-pay | Admitting: Family Medicine

## 2015-05-20 NOTE — Telephone Encounter (Signed)
Attempted to reach patient to discuss lab results. No answer. Left vm asking she return the call. Please obtain contact info from her when she calls back.  Thanks, Leeanne Rio, MD

## 2015-05-28 NOTE — Telephone Encounter (Signed)
Attempted again to reach patient. No answer. Left another voicemail asking her to return the call. Leeanne Rio, MD

## 2015-06-04 ENCOUNTER — Encounter: Payer: Self-pay | Admitting: Family Medicine

## 2015-06-04 NOTE — Telephone Encounter (Addendum)
Attempted again to reach patient. No answer. Left voicemail again asking her to call back. Will send letter asking that she call us to discuss lab results.  Letter routed to [facility] admin team. Primary concern is making sure she has followed up with GYN for the pelvic mass, and also need to repeat CBC due to low WBC count. Will await patient's call.  Leeanne Rio, MD

## 2015-06-25 ENCOUNTER — Encounter: Payer: Self-pay | Admitting: Family Medicine

## 2015-06-25 ENCOUNTER — Ambulatory Visit (INDEPENDENT_AMBULATORY_CARE_PROVIDER_SITE_OTHER): Payer: Managed Care, Other (non HMO) | Admitting: Family Medicine

## 2015-06-25 VITALS — BP 132/88 | HR 56 | Temp 98.3°F | Ht 65.0 in | Wt 100.0 lb

## 2015-06-25 DIAGNOSIS — R591 Generalized enlarged lymph nodes: Secondary | ICD-10-CM

## 2015-06-25 DIAGNOSIS — R1900 Intra-abdominal and pelvic swelling, mass and lump, unspecified site: Secondary | ICD-10-CM | POA: Diagnosis not present

## 2015-06-25 DIAGNOSIS — D72819 Decreased white blood cell count, unspecified: Secondary | ICD-10-CM

## 2015-06-25 LAB — CBC WITH DIFFERENTIAL/PLATELET
Basophils Absolute: 42 cells/uL (ref 0–200)
Basophils Relative: 1 %
Eosinophils Absolute: 126 cells/uL (ref 15–500)
Eosinophils Relative: 3 %
HCT: 41.9 % (ref 35.0–45.0)
Hemoglobin: 14.2 g/dL (ref 11.7–15.5)
Lymphocytes Relative: 21 %
Lymphs Abs: 882 cells/uL (ref 850–3900)
MCH: 30.1 pg (ref 27.0–33.0)
MCHC: 33.9 g/dL (ref 32.0–36.0)
MCV: 89 fL (ref 80.0–100.0)
MPV: 11.2 fL (ref 7.5–12.5)
Monocytes Absolute: 378 cells/uL (ref 200–950)
Monocytes Relative: 9 %
Neutro Abs: 2772 cells/uL (ref 1500–7800)
Neutrophils Relative %: 66 %
Platelets: 142 10*3/uL (ref 140–400)
RBC: 4.71 MIL/uL (ref 3.80–5.10)
RDW: 13.4 % (ref 11.0–15.0)
WBC: 4.2 10*3/uL (ref 3.8–10.8)

## 2015-06-25 NOTE — Patient Instructions (Signed)
Getting you set up for ultrasound of neck and of pelvis Repeating blood counts today  Follow up with me in 4 weeks after the ultrasounds, sooner if you have any issues Schedule appointment with gynecologist to update pap smear  Be well, Dr. Ardelia Mems

## 2015-06-25 NOTE — Assessment & Plan Note (Signed)
Schedule u/s of neck to confirm that these appear to be lymph nodes Discussed role of possible lymph node biopsy, patient not interested in that at this time Repeat CBC diff today since last visit had slight leukopenia Follow up with me in 4 weeks after ultrasounds (will be scheduled in 2 weeks as patient out of town next week)

## 2015-06-25 NOTE — Progress Notes (Signed)
Date of Visit: 06/25/2015   HPI:  Patient presents to follow up on labwork.  Last labwork showed leukopenia with wbc count of 3.8. Denies fever or night sweats. Weight is stable from last visit.   Pelvic mass - still notices mass in left lower quadrant/pelvis. Has not seen GYN yet, plans to call to schedule an appointment. It changes in size some. Declines pelvic exam today.  ROS: See HPI.  [place]: history of tetralogy of fallot, GERD, polycythemia, prior unintentional weight loss, congenital cleft palate  PHYSICAL EXAM: BP 132/88 mmHg  Pulse 56  Temp(Src) 98.3 F (36.8 C) (Oral)  Ht 5\' 5"  (1.651 m)  Wt 100 lb (45.36 kg)  BMI 16.64 kg/m2  SpO2 96% Gen: NAD, pleasant, cooperative HEENT: normocephalic, atraumatic, moist mucous membranes. + anterior bilateral submandibular lymph nodes vs glands, nontender, freely mobile, approximately 3cm in size Heart: regular rate and rhythm Lungs: clear to auscultation bilaterally, wnbo Neuro:  Alert, grossly nonfocal, speech normal Ext: atraumatic Abdomen: fullness palpated again in LLQ, possible mass that is mobile Pelvic: patient declined pelvic exam  ASSESSMENT/PLAN:  Pelvic mass in female Persistent. Of greatest concern would be some kind of malignancy given low BMI, leukopenia, possible cervical lymphadenopathy. Unable to fully assess adnexa today as patient declined pelvic exam Will check pelvic ultrasound. Follow up with me in 4 weeks Offered to update pap today but patient declined, stated she wants to see GYN for this  Lymphadenopathy Schedule u/s of neck to confirm that these appear to be lymph nodes Discussed role of possible lymph node biopsy, patient not interested in that at this time Repeat CBC diff today since last visit had slight leukopenia Follow up with me in 4 weeks after ultrasounds (will be scheduled in 2 weeks as patient out of town next week)   FOLLOW UP: Follow up in 4 weeks for above issues.  [place].  Ardelia Mems, [place]

## 2015-06-25 NOTE — Assessment & Plan Note (Signed)
Persistent. Of greatest concern would be some kind of malignancy given low BMI, leukopenia, possible cervical lymphadenopathy. Unable to fully assess adnexa today as patient declined pelvic exam Will check pelvic ultrasound. Follow up with me in 4 weeks Offered to update pap today but patient declined, stated she wants to see GYN for this

## 2015-06-28 ENCOUNTER — Encounter: Payer: Self-pay | Admitting: Obstetrics & Gynecology

## 2015-06-29 ENCOUNTER — Encounter: Payer: Self-pay | Admitting: Family Medicine

## 2015-07-05 ENCOUNTER — Ambulatory Visit (HOSPITAL_COMMUNITY): Payer: Managed Care, Other (non HMO)

## 2015-07-24 ENCOUNTER — Other Ambulatory Visit: Payer: Self-pay | Admitting: Family Medicine

## 2015-07-26 NOTE — Telephone Encounter (Signed)
Refilled x 1 month - is supposed to have follow up for mass  Seems to be within refill agreement  Please call in Rx for tramadol  Thanks  LC

## 2015-07-26 NOTE — Telephone Encounter (Signed)
Rx called into her pharmacy. [place], cma.

## 2015-07-27 ENCOUNTER — Ambulatory Visit (INDEPENDENT_AMBULATORY_CARE_PROVIDER_SITE_OTHER): Payer: Managed Care, Other (non HMO) | Admitting: Obstetrics & Gynecology

## 2015-07-27 ENCOUNTER — Encounter: Payer: Self-pay | Admitting: Obstetrics & Gynecology

## 2015-07-27 VITALS — BP 136/72 | HR 60 | Resp 12 | Ht 63.75 in | Wt 103.2 lb

## 2015-07-27 DIAGNOSIS — Z01419 Encounter for gynecological examination (general) (routine) without abnormal findings: Secondary | ICD-10-CM

## 2015-07-27 DIAGNOSIS — Z0000 Encounter for general adult medical examination without abnormal findings: Secondary | ICD-10-CM

## 2015-07-27 DIAGNOSIS — Z124 Encounter for screening for malignant neoplasm of cervix: Secondary | ICD-10-CM | POA: Diagnosis not present

## 2015-07-27 LAB — POCT URINALYSIS DIPSTICK
Bilirubin, UA: NEGATIVE
Blood, UA: NEGATIVE
Glucose, UA: NEGATIVE
Ketones, UA: NEGATIVE
Leukocytes, UA: NEGATIVE
Nitrite, UA: NEGATIVE
Protein, UA: NEGATIVE
Urobilinogen, UA: NEGATIVE
pH, UA: 5

## 2015-07-27 NOTE — Progress Notes (Signed)
60 y.o. G0 Single CaucasianF here for annual exam/new pt exam.  Pt was prior pt but hasn't been seen in >3 years.  Pt has complaint of feeling like she has a left abdominal mass/bulge/prominence.  She noted this a few months ago.  Denies pain.  Denies vaginal bleeding.  Pt Has been menopausal since 2008. Denies nausea and/or diarrhea.  She does have a long hx of constipation.  Has pelvic ultrasound scheduled for 08/02/15.  No LMP recorded. Patient is postmenopausal.          Sexually active: No.  The current method of family planning is none.    Exercising: No.  The patient does not participate in regular exercise at present. Smoker:  no  Health Maintenance: Pap:  ~3 years ago History of abnormal Pap:  no MMG:  10/23/2014 BIRADS 1 negative Colonoscopy: 09/2012, [facility].  Also had a EGD at the same. BMD:   11/22/2011 Osteoporosis TDaP:  10/16/2007 Shingles vaccine:  Completed Hep C testing 3/17 Screening Labs: PCP, Hb today: PCP, Urine today: pending   reports that she has never smoked. She has never used smokeless tobacco.  Past Medical History  Diagnosis Date  . Osteoporosis   . H/O major depression   . GERD (gastroesophageal reflux disease)   . H/O cleft lip     Palate  . H/O tetralogy of Fallot repair 1973  . Endometrial hyperplasia without atypia, complex 02/2000  . Abnormal RBC     h/o. f/u by PCP    Past Surgical History  Procedure Laterality Date  . Cleft lip repair      and palate as child  . Tetralogy of fallot repair  1973  . Hernia repair      No family history on file.  ROS:  Pertinent items are noted in HPI.  Otherwise, a comprehensive ROS was negative.  Exam:   General appearance: alert, cooperative and appears stated age Head: Normocephalic, without obvious abnormality, atraumatic Neck: no adenopathy, supple, symmetrical, trachea midline and thyroid normal to inspection and palpation Lungs: clear to auscultation bilaterally Breasts:  normal appearance, no masses or tenderness Heart: regular rate and rhythm with 3/6 holosystolic murmur Abdomen: soft, non-tender; bowel sounds normal; no masses,  no organomegaly Extremities: extremities normal, atraumatic, no cyanosis or edema Skin: Skin color, texture, turgor normal. No rashes or lesions Lymph nodes: Cervical, supraclavicular, and axillary nodes normal. No abnormal inguinal nodes palpated Neurologic: Grossly normal   Pelvic: External genitalia:  no lesions              Urethra:  normal appearing urethra with no masses, tenderness or lesions              Bartholins and Skenes: normal                 Vagina: normal appearing vagina with normal color and discharge, no lesions              Cervix: no lesions              Pap taken: Yes.   Bimanual Exam:  Uterus:  normal size, contour, position, consistency, mobility, non-tender              Adnexa: normal adnexa and no mass, fullness, tenderness               Rectovaginal: Confirms               Anus:  normal sphincter tone, no lesions  Chaperone was present for exam.  A:  Well Woman with normal exam PMP, no HRT Abdominal "mass" that I do not feel on exam today.  Pt does have more prominent left anterior superior iliac crest and left up is higher than right hip.  Also, she has scoliosis.  I feel she is just feeling the presence of the left side of her abdomen but to structural body changes from her scoliosis H/O tetrology of Fallot with repair 1973 with heart murmur H/O cleft lip and palate repair Insomnia  P:   Mammogram yearly pap smear with HR HPV today Pt does have PUS scheduled for Monday.  She will NOT be able to have vaginal PUS so advised pt how to be best prepared with water intake before transabdominal PUS.  Doubtful this will show anything.  No evidence of hernia on exam either. Lab work and vaccinations with D.r Ardelia Mems. return annually or prn

## 2015-07-29 LAB — IPS PAP TEST WITH HPV

## 2015-08-02 ENCOUNTER — Ambulatory Visit (HOSPITAL_COMMUNITY)
Admission: RE | Admit: 2015-08-02 | Discharge: 2015-08-02 | Disposition: A | Discharge status: Home / Self Care | Payer: Managed Care, Other (non HMO) | Source: Ambulatory Visit | Attending: Family Medicine | Admitting: Family Medicine

## 2015-08-02 DIAGNOSIS — E041 Nontoxic single thyroid nodule: Secondary | ICD-10-CM

## 2015-08-02 DIAGNOSIS — R591 Generalized enlarged lymph nodes: Secondary | ICD-10-CM | POA: Insufficient documentation

## 2015-08-02 DIAGNOSIS — R1900 Intra-abdominal and pelvic swelling, mass and lump, unspecified site: Secondary | ICD-10-CM | POA: Diagnosis not present

## 2015-08-06 ENCOUNTER — Encounter: Payer: Self-pay | Admitting: Family Medicine

## 2015-08-09 ENCOUNTER — Encounter: Payer: Self-pay | Admitting: Family Medicine

## 2015-08-09 DIAGNOSIS — E041 Nontoxic single thyroid nodule: Secondary | ICD-10-CM | POA: Insufficient documentation

## 2015-10-04 ENCOUNTER — Other Ambulatory Visit: Payer: Self-pay | Admitting: Family Medicine

## 2015-10-25 ENCOUNTER — Ambulatory Visit: Payer: Managed Care, Other (non HMO)

## 2015-10-25 ENCOUNTER — Ambulatory Visit: Payer: Managed Care, Other (non HMO) | Admitting: Family Medicine

## 2015-11-01 ENCOUNTER — Ambulatory Visit (INDEPENDENT_AMBULATORY_CARE_PROVIDER_SITE_OTHER): Payer: Self-pay | Admitting: Family Medicine

## 2015-11-01 ENCOUNTER — Encounter: Payer: Self-pay | Admitting: Family Medicine

## 2015-11-01 DIAGNOSIS — G8929 Other chronic pain: Secondary | ICD-10-CM

## 2015-11-01 DIAGNOSIS — M545 Low back pain: Secondary | ICD-10-CM

## 2015-11-01 DIAGNOSIS — H9313 Tinnitus, bilateral: Secondary | ICD-10-CM

## 2015-11-01 DIAGNOSIS — H9319 Tinnitus, unspecified ear: Secondary | ICD-10-CM | POA: Insufficient documentation

## 2015-11-01 DIAGNOSIS — G4700 Insomnia, unspecified: Secondary | ICD-10-CM

## 2015-11-01 MED ORDER — ZOLPIDEM TARTRATE 5 MG PO TABS: 5.0000 mg | ORAL_TABLET | Freq: Every evening | ORAL | 0 refills | Status: DC | PRN

## 2015-11-01 MED ORDER — TRAMADOL HCL 50 MG PO TABS: 50.0000 mg | ORAL_TABLET | Freq: Three times a day (TID) | ORAL | 0 refills | Status: DC | PRN

## 2015-11-01 NOTE — Assessment & Plan Note (Signed)
Encouraged her to see her audiologist. May need adjustment to hearing aids. Patient agreeable to this plan.

## 2015-11-01 NOTE — Progress Notes (Signed)
error 

## 2015-11-01 NOTE — Patient Instructions (Signed)
I am prescribing Lorrin Mais Stop the other sleep aids - dangerous to take all these together Don't mix with alcohol or other sedating substances  Call the audiology office for an appointment for your ears  Refilled tramadol.  Follow up in 3 months, sooner if needed  Be well, Dr. Ardelia Mems

## 2015-11-01 NOTE — Assessment & Plan Note (Signed)
Refill tramadol for occasional use.

## 2015-11-01 NOTE — Assessment & Plan Note (Signed)
Significant issues falling asleep despite taking multiple sedating medications. Advised that 100mg  of diphenydramine at night is TOO much.  She will stop the benadryl and melatonin. Will cautiously add ambien 5mg  at night. Stressed importance of not taking with other sedating medications (alcohol, over the counter medications). Follow up if not improving.

## 2015-11-01 NOTE — Progress Notes (Signed)
Date of Visit: 11/01/2015   HPI:  Patient presents to discuss:  - insomnia - having lots of trouble going to sleep at night. Has been taking diphenhydramine 50mg  TWO tablets as well as melatonin 3mg  tablets (2 tabs) at night, still waits about 3 hours to go to sleep. Has previously worked on sleep hygiene without relief. Also takes trazodone 2 tabs at bedtime. Lays in bed, unable to sleep. Gets in bed between 9 &10pm, out of bed by 7:30am. Requests Lorrin Mais, has done this before and it worked well.  - tinnitus - has always had ringing in R ear for the last 4 years. Over the last 3 weeks has now also begun having ringing in L ear. Wears hearing aids. Has seen ENT & audiology in the past, though not recently.  - back pain - requests refill of tramadol. Doing well on this medication. Has scoliosis, it helps with back pain associated with the scoliosis.   ROS: See HPI.  [place]: history of insomnia, chronic sinusitis, depression, polycythemia, allergic rhinitis, GERD, vit D deficiency, repaired tetrallogy of fallot  PHYSICAL EXAM: BP 138/64   Pulse 63   Temp 98 F (36.7 C) (Oral)   Ht 5\' 3"  (1.6 m)   Wt 101 lb 9.6 oz (46.1 kg)   BMI 18.00 kg/m  Gen: NAD, pleasant, cooperative HEENT: tympanic membranes clear bilaterally, wearing hearing aids, No anterior cervical or supraclavicular lymphadenopathy.  Heart: RRR Lungs: CTAB, NWOB Neuro: grossly nonfocal, speech intact Ext: No appreciable lower extremity edema bilaterally   ASSESSMENT/PLAN:  Insomnia Significant issues falling asleep despite taking multiple sedating medications. Advised that 100mg  of diphenydramine at night is TOO much.  She will stop the benadryl and melatonin. Will cautiously add ambien 5mg  at night. Stressed importance of not taking with other sedating medications (alcohol, over the counter medications). Follow up if not improving.  Tinnitus Encouraged her to see her audiologist. May need adjustment to hearing aids.  Patient agreeable to this plan.  Lumbago Refill tramadol for occasional use.  FOLLOW UP: Follow up in 3 mos for routine medical problems  [place] J. Ardelia Mems, [place]

## 2015-11-16 ENCOUNTER — Encounter: Payer: Self-pay | Admitting: Family Medicine

## 2015-11-16 ENCOUNTER — Ambulatory Visit (INDEPENDENT_AMBULATORY_CARE_PROVIDER_SITE_OTHER): Payer: Self-pay | Admitting: Family Medicine

## 2015-11-16 VITALS — BP 108/68 | HR 70 | Temp 97.8°F | Resp 17 | Ht 65.5 in | Wt 101.0 lb

## 2015-11-16 DIAGNOSIS — Z021 Encounter for pre-employment examination: Secondary | ICD-10-CM

## 2015-11-16 DIAGNOSIS — Z9622 Myringotomy tube(s) status: Secondary | ICD-10-CM

## 2015-11-16 DIAGNOSIS — H9193 Unspecified hearing loss, bilateral: Secondary | ICD-10-CM

## 2015-11-16 DIAGNOSIS — Z111 Encounter for screening for respiratory tuberculosis: Secondary | ICD-10-CM

## 2015-11-16 NOTE — Patient Instructions (Addendum)
IF you received an x-ray today, you will receive an invoice from [facility]. Please contact [facility] at [phone] with questions or concerns regarding your invoice.   IF you received labwork today, you will receive an invoice from [employer]. Please contact [facility] at [phone] with questions or concerns regarding your invoice.   Our billing staff will not be able to assist you with questions regarding bills from these companies.  You will be contacted with the lab results as soon as they are available. The fastest way to get your results is to activate your My Chart account. Instructions are located on the last page of this paperwork. If you have not heard from [place] regarding the results in 2 weeks, please contact this office.      Hearing Loss Hearing loss is a partial or total loss of the ability to hear. This can be temporary or permanent, and it can happen in one or both ears. Hearing loss may be referred to as deafness. Medical care is necessary to treat hearing loss properly and to prevent the condition from getting worse. Your hearing may partially or completely come back, depending on what caused your hearing loss and how severe it is. In some cases, hearing loss is permanent. CAUSES Common causes of hearing loss include:   Too much wax in the ear canal.   Infection of the ear canal or middle ear.   Fluid in the middle ear.   Injury to the ear or surrounding area.   An object stuck in the ear.   Prolonged exposure to loud sounds, such as music.  Less common causes of hearing loss include:   Tumors in the ear.   Viral or bacterial infections, such as meningitis.   A hole in the eardrum (perforated eardrum).  Problems with the hearing nerve that sends signals between the brain and the ear.  Certain medicines.  SYMPTOMS  Symptoms of this condition may include:  Difficulty telling the difference  between sounds.  Difficulty following a conversation when there is background noise.  Lack of response to sounds in your environment. This may be most noticeable when you do not respond to startling sounds.  Needing to turn up the volume on the television, radio, etc.  Ringing in the ears.  Dizziness.  Pain in the ears. DIAGNOSIS This condition is diagnosed based on a physical exam and a hearing test (audiometry). The audiometry test will be performed by a hearing specialist (audiologist). You may also be referred to an ear, nose, and throat (ENT) specialist (otolaryngologist).  TREATMENT Treatment for recent onset of hearing loss may include:   Ear wax removal.   Being prescribed medicines to prevent infection (antibiotics).   Being prescribed medicines to reduce inflammation (corticosteroids).  HOME CARE INSTRUCTIONS  If you were prescribed an antibiotic medicine, take it as told by your health care provider. Do not stop taking the antibiotic even if you start to feel better.  Take over-the-counter and prescription medicines only as told by your health care provider.  Avoid loud noises.   Return to your normal activities as told by your health care provider. Ask your health care provider what activities are safe for you.  Keep all follow-up visits as told by your health care provider. This is important. SEEK MEDICAL CARE IF:   You feel dizzy.   You develop new symptoms.   You vomit or feel nauseous.   You have a fever.  SEEK  IMMEDIATE MEDICAL CARE IF:  You develop sudden changes in your vision.   You have severe ear pain.   You have new or increased weakness.  You have a severe headache.   This information is not intended to replace advice given to you by your health care provider. Make sure you discuss any questions you have with your health care provider.   Document Released: 03/06/2005 Document Revised: 11/25/2014 Document Reviewed:  07/22/2014 Elsevier Interactive Patient Education [employer].

## 2015-11-16 NOTE — Progress Notes (Signed)

## 2015-11-17 ENCOUNTER — Telehealth: Payer: Self-pay | Admitting: Family Medicine

## 2015-11-17 NOTE — Progress Notes (Signed)
Subjective:  By signing my name below, I, Marie Harvey, attest that this documentation has been prepared under the direction and in the presence of Marie Cheadle, MD. Electronically Signed: Moises Harvey, [place]. 11/17/2015 , 8:39 AM .  Patient was seen in Room 9 .   Patient ID: Marie Harvey, female    DOB: [birth year], 60 y.o.   MRN: [id number] Chief Complaint  Patient presents with  . Annual Exam    for work    HPI Marie Harvey is a 60 y.o. female who presents to [facility] for annual physical for work. She notes taking calcium supplements make her sick. She eats a lot of cottage cheese for calcium. Her last Tdap was in 10/16/2007. She plans to receive flu shot next month. She denies history of positive TB skin testing. She denies fever, chills, cough, chest tightness or shortness of breath. She denies alcohol use or illicit drug use.   She works as a Emergency planning/management officer.  She had a drug screen done about 3~4 weeks ago at [employer] in [facility].   She denies seeing ear doctor in a long time. She was unaware of tube placed in her left ear.   Past Medical History:  Diagnosis Date  . Abnormal RBC    h/o. f/u by PCP  . Endometrial hyperplasia without atypia, complex 02/2000  . GERD (gastroesophageal reflux disease)   . H/O cleft lip    Palate  . H/O major depression   . H/O tetralogy of Fallot repair 1973  . Heart murmur    As a child  . Osteoporosis    Prior to Admission medications   Medication Sig Start Date End Date Taking? Authorizing Provider  traMADol (ULTRAM) 50 MG tablet Take 1 tablet (50 mg total) by mouth every 8 (eight) hours as needed. 11/01/15  Yes Leeanne Rio, MD  zolpidem (AMBIEN) 5 MG tablet Take 1 tablet (5 mg total) by mouth at bedtime as needed for sleep. Reported on 07/27/2015 11/01/15  Yes Leeanne Rio, MD  fluticasone [facility]) 50 MCG/ACT nasal spray Place 2 sprays into both nostrils daily. Patient not taking: Reported on 11/16/2015 11/14/13    Willeen Niece, MD  ibuprofen (ADVIL,MOTRIN) 200 MG tablet Take 400 mg by mouth.    Historical Provider, MD  latanoprost (XALATAN) 0.005 % ophthalmic solution PLACE 1 DROP INTO BOTH EYES NIGHTLY. 06/09/15   Historical Provider, MD  traZODone (DESYREL) 50 MG tablet TAKE 1 TO 2 TABLETS BY MOUTH AT BEDTIME FOR SLEEP Patient not taking: Reported on 11/16/2015 10/04/15   Alveda Reasons, MD   Allergies  Allergen Reactions  . Codeine     REACTION: Nausea, not a true allergy  . Calcium-Containing Compounds Nausea And Vomiting  . Penicillins Rash and Other (See Comments)    Lip Numbness  . Vitamin D Analogs Nausea And Vomiting   Past Surgical History:  Procedure Laterality Date  . ABDOMINAL SURGERY    . CLEFT LIP REPAIR     and palate as child  . HERNIA REPAIR    . TETRALOGY OF FALLOT REPAIR  1973   Family History  Problem Relation Age of Onset  . Uterine cancer Mother   . Stroke Father    Social History   Social History  . Marital status: Single    Spouse name: N/A  . Number of children: N/A  . Years of education: N/A   Social History Main Topics  . Smoking status: Never Smoker  .  Smokeless tobacco: Never Used  . Alcohol use No  . Drug use: No  . Sexual activity: No   Other Topics Concern  . None   Social History Narrative  . None    Depression screen [facility] 2/9 06/25/2015 05/03/2015 11/11/2014 10/02/2014 04/24/2014  Decreased Interest 0 0 0 0 0  Down, Depressed, Hopeless 0 0 0 0 0  PHQ - 2 Score 0 0 0 0 0    Review of Systems  Constitutional: Negative for chills, fatigue, fever and unexpected weight change.  Respiratory: Negative for cough.   Gastrointestinal: Negative for constipation, diarrhea, nausea and vomiting.  Skin: Negative for rash and wound.  Neurological: Negative for dizziness, weakness and headaches.  All other systems reviewed and are negative.      Objective:   Physical Exam  Constitutional: She is oriented to person, place, and time. She appears  well-developed and well-nourished. No distress.  HENT:  Head: Normocephalic and atraumatic.  Right Ear: Tympanic membrane is injected and retracted.  Left Ear: Tympanic membrane is injected and retracted.  Nose: Mucosal edema and rhinorrhea present.  Mouth/Throat: She has dentures.  Bright white tube in place in left ear at 10 o'clock positive; postnasal drip present; septum deviation and cleft lip repair; upper dentures worn unable to examine hard palate; failed bilateral hearing to soft sound  Eyes: EOM are normal. Pupils are equal, round, and reactive to light.  Neck: Neck supple. No thyromegaly present.  Cardiovascular: Normal rate and regular rhythm.   Murmur heard.  Systolic murmur is present with a grade of 2/6  Pulmonary/Chest: Effort normal and breath sounds normal. No accessory muscle usage. No respiratory distress.  Abdominal: Soft. Bowel sounds are normal. She exhibits no distension and no mass. There is no hepatosplenomegaly. There is no tenderness. There is no CVA tenderness.  Musculoskeletal: Normal range of motion.  Thoracic scoliosis causing chronic elevation of right shoulder and deviation of c-spine to right, but strength 5/5 in all extremities and normal gait  Lymphadenopathy:    She has no cervical adenopathy.  Neurological: She is alert and oriented to person, place, and time.  Reflex Scores:      Bicep reflexes are 2+ on the right side and 2+ on the left side.      Brachioradialis reflexes are 2+ on the right side and 2+ on the left side.      Patellar reflexes are 2+ on the right side and 2+ on the left side.      Achilles reflexes are 2+ on the right side and 2+ on the left side. Skin: Skin is warm and dry.  Psychiatric: She has a normal mood and affect. Her behavior is normal.  Nursing note and vitals reviewed.   Visual Acuity Screening   Right eye Left eye Both eyes  Without correction:     With correction: 20/25 20/25 20/25     BP 108/68 (BP Location:  Right Arm, Patient Position: Sitting, Cuff Size: Normal)   Pulse 70   Temp 97.8 F (36.6 C) (Oral)   Resp 17   Ht 5' 5.5" (1.664 m)   Wt 101 lb (45.8 kg)   SpO2 98%   BMI 16.55 kg/m     Assessment & Plan:   1. Pre-employment health screening examination - form completed at time of visit for [facility].  Pt does have scoliosis but her strength and coordination do not appear to be substantially impaired on exam. She can be a little  difficult to understand - slight dysarthria due to her cleft palate and lip repair but again this should not impair her ability to work - is completely surmountable.  She also has a h/o repair of tetrology of fallot as a child but does not seem to have any current cardiac or respiratory problems stemming from this or any other disease that would require restrictions.  2. Screening for tuberculosis - ppd placed  3. Hearing loss, bilateral - to soft sounds, no problems with normal conversation so I do not think that this will impact her ability to work other than that she should wear her hearing aides while at work (not wearing today)  4. Presence of tympanostomy tube in tympanic membrane - in left tm - look like the tube might be occluded with debris which might be good since pt was unaware of its presence.  Will refer back to ENT for eval of this as well as of her hearing loss.    Orders Placed This Encounter  Procedures  . Ambulatory referral to ENT    Referral Priority:   Routine    Referral Type:   Consultation    Referral Reason:   Specialty Services Required    Requested Specialty:   Otolaryngology    Number of Visits Requested:   1  . TB Skin Test    Order Specific Question:   Has patient ever tested positive?    Answer:   No    I personally performed the services described in this documentation, which was scribed in my presence. The recorded information has been reviewed and considered, and addended by me as needed.   Marie Harvey,  M.D.  [facility] [address] [place], [place] [address] [phone] phone [phone] fax  11/17/15 11:14 AM

## 2015-11-18 ENCOUNTER — Ambulatory Visit (INDEPENDENT_AMBULATORY_CARE_PROVIDER_SITE_OTHER): Payer: Self-pay | Admitting: Physician Assistant

## 2015-11-18 DIAGNOSIS — Z111 Encounter for screening for respiratory tuberculosis: Secondary | ICD-10-CM

## 2015-11-18 LAB — TB SKIN TEST: TB Skin Test: NEGATIVE

## 2015-11-18 NOTE — Progress Notes (Signed)
PPD reading  

## 2015-12-03 ENCOUNTER — Other Ambulatory Visit: Payer: Self-pay | Admitting: *Deleted

## 2015-12-03 MED ORDER — ZOLPIDEM TARTRATE 5 MG PO TABS: 5.0000 mg | ORAL_TABLET | Freq: Every evening | ORAL | 3 refills | Status: DC | PRN

## 2016-02-22 ENCOUNTER — Other Ambulatory Visit: Payer: Self-pay | Admitting: *Deleted

## 2016-02-25 ENCOUNTER — Other Ambulatory Visit: Payer: Self-pay | Admitting: Family Medicine

## 2016-02-25 MED ORDER — TRAMADOL HCL 50 MG PO TABS: 50.0000 mg | ORAL_TABLET | Freq: Three times a day (TID) | ORAL | 0 refills | Status: DC | PRN

## 2016-02-25 NOTE — Telephone Encounter (Signed)
2nd request.  Martin, Tamika L, RN  

## 2016-03-18 ENCOUNTER — Other Ambulatory Visit: Payer: Self-pay | Admitting: Family Medicine

## 2016-03-22 NOTE — Telephone Encounter (Signed)
Red team, please advise patient that I have called in 30 days worth of this medication but she needs to come in for follow up for any further refills on controlled substance.  Leeanne Rio, MD

## 2016-03-23 NOTE — Telephone Encounter (Signed)
Left message on patient voicemail informing of message from PCP, asked that she call back to schedule an appointment.

## 2016-05-08 ENCOUNTER — Encounter: Payer: Self-pay | Admitting: Family Medicine

## 2016-05-15 ENCOUNTER — Other Ambulatory Visit: Payer: Self-pay | Admitting: Family Medicine

## 2016-05-29 ENCOUNTER — Encounter: Payer: Self-pay | Admitting: Family Medicine

## 2016-05-29 ENCOUNTER — Ambulatory Visit (INDEPENDENT_AMBULATORY_CARE_PROVIDER_SITE_OTHER): Payer: Self-pay | Admitting: Family Medicine

## 2016-05-29 DIAGNOSIS — G4700 Insomnia, unspecified: Secondary | ICD-10-CM

## 2016-05-29 MED ORDER — ZOLPIDEM TARTRATE 5 MG PO TABS: ORAL_TABLET | ORAL | 2 refills | Status: DC

## 2016-05-29 NOTE — Patient Instructions (Signed)
Refilled ambien for 3 months See me in 3 months  Be well, Dr. Ardelia Mems

## 2016-05-29 NOTE — Progress Notes (Signed)
Date of Visit: 05/29/2016   HPI:  Patient presents to follow up on insomnia.  She would like refill of ambien. Has been out of it for 3 months. On nights when she does not take it, she gets only 2 hours of sleep, some nights gets no sleep. With [place], gets 8 hours of sleep and feels well. No unwanted side effects from the medication. Tolerating it well. No sleepwalking. No other medications or behavioral interventions have worked this well for her. Otherwise she is doing quite well and has no complaints. Continues to work on sleep hygiene as well.  ROS: See HPI.  [place]: history of depression, allergic rhinitis, congenital scoliosis, repaired tetralogy of fallot, GERD  PHYSICAL EXAM: BP 130/80 (BP Location: Right Arm, Patient Position: Sitting, Cuff Size: Normal)   Pulse 64   Temp 97.6 F (36.4 C) (Oral)   Ht 5\' 3"  (1.6 m)   Wt 118 lb 9.6 oz (53.8 kg)   SpO2 97%   BMI 21.01 kg/m  Gen: no acute distress, pleasant, cooperative, well appearing HEENT: normocephalic, atraumatic  Heart: regular rate and rhythm, systolic murmur present at baseline Lungs: clear to auscultation bilaterally  Neuro: alert grossly nonfocal speech normal Ext: No appreciable lower extremity edema bilaterally   ASSESSMENT/PLAN:  Health maintenance:  -UTD on health maintenance items  Insomnia Does well on ambien, no unwanted side effects. Refill for 3 months.   FOLLOW UP: Follow up in 3 mos for insomnia  [place] J. Ardelia Mems, [place]

## 2016-05-31 NOTE — Assessment & Plan Note (Signed)
Does well on ambien, no unwanted side effects. Refill for 3 months.

## 2016-07-20 ENCOUNTER — Other Ambulatory Visit: Payer: Self-pay | Admitting: Family Medicine

## 2016-07-25 NOTE — Telephone Encounter (Signed)
I am out of town and tramadol requires a printed and signed rx I will take care of this tomorrow when I return Thanks Leeanne Rio, MD

## 2016-09-22 ENCOUNTER — Ambulatory Visit (INDEPENDENT_AMBULATORY_CARE_PROVIDER_SITE_OTHER): Payer: Self-pay | Admitting: Family Medicine

## 2016-09-22 ENCOUNTER — Encounter: Payer: Self-pay | Admitting: Family Medicine

## 2016-09-22 DIAGNOSIS — E041 Nontoxic single thyroid nodule: Secondary | ICD-10-CM

## 2016-09-22 DIAGNOSIS — M545 Low back pain: Secondary | ICD-10-CM

## 2016-09-22 DIAGNOSIS — G8929 Other chronic pain: Secondary | ICD-10-CM

## 2016-09-22 DIAGNOSIS — M810 Age-related osteoporosis without current pathological fracture: Secondary | ICD-10-CM

## 2016-09-22 DIAGNOSIS — G4700 Insomnia, unspecified: Secondary | ICD-10-CM

## 2016-09-22 MED ORDER — ZOLPIDEM TARTRATE 5 MG PO TABS
ORAL_TABLET | ORAL | 3 refills | Status: DC
Start: 2016-09-22 — End: 2017-01-19

## 2016-09-22 MED ORDER — TRAMADOL HCL 50 MG PO TABS: 50.0000 mg | ORAL_TABLET | Freq: Three times a day (TID) | ORAL | 1 refills | Status: DC | PRN

## 2016-09-22 NOTE — Assessment & Plan Note (Addendum)
Appears she previously was set up for Reclast injections years ago. She has not had a bone density test in 5 years. I would like to see where her bone density as before deciding on future treatments. She is not a candidate for oral bisphosphonates due to her esophageal issues. She was given the phone number and will call to find out the cost out of pocket for a DEXA scan.

## 2016-09-22 NOTE — Assessment & Plan Note (Signed)
Stable, no red flags. Refill tramadol for occasional use. Counseled not to use concomitantly with Ambien.

## 2016-09-22 NOTE — Patient Instructions (Signed)
Refilled your medications (tramadol and ambien) Do not mix these 2 together Use tramadol only as needed  For osteoporosis - call the breast imaging center to inquire about the cost of another bone density test. We should get this before deciding on how to proceed but it is something we should address.  Be well, Dr. Ardelia Mems

## 2016-09-22 NOTE — Assessment & Plan Note (Signed)
Stable on Ambien. Will refill today for 4 months. No unwanted side effects. She has tried lots of other remedies to help her sleep, and truly seems to benefit from Ambien.

## 2016-09-22 NOTE — Progress Notes (Addendum)
Date of Visit: 09/22/2016   HPI:  Marie Harvey presents today for medication refills. She is overall doing quite well.  Insomnia - requests refill of ambien. She has been taking it nightly, but ran out of it last month. She is now been using an over-the-counter sleep aid. The Ambien works well for her. When she doesn't have it, there are nights where she has trouble sleeping at all. She has no unwanted side effects from the Ambien. She is using the 5 mg pill and it has worked well.  Low back pain - overall this is stable. She uses tramadol daily as needed. She avoids taking it if possible. 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 After those days, sometimes she has worsening back pain. She uses a heating pad often. The tramadol does not make her sleepy. She will sometimes go weeks without taking a dose. She needs a refill of this. Denies having fever, saddle anesthesia, lower extremity weakness, or problems with stooling or urination.   Osteoporosis: Reports she did not tolerate oral calcium previously because it upset her stomach. Is not taking a vitamin D supplement presently. Last bone density test was 5 years ago. She is agreeable to getting another one, but does not know how much it will cost. She does not have any insurance.  Thyroid nodule: In May 2017 she had an ultrasound of her neck which noted a 0.7 cm nodule in the right thyroid. They had recommended if she was at high risk for cancer, that she obtain a follow-up ultrasound in 12 months. She denies any family history of thyroid cancer, or personal history of radiation to the neck. She would prefer to wait on any follow-up imaging at this time.  ROS: See HPI.  Marie Harvey: History of depression, allergic rhinitis, scoliosis, tetralogy of fallot, GERD, insomnia, chronic intermittent lumbago, osteoporosis, esophageal stricture, thyroid nodule  PHYSICAL EXAM: BP 124/86   Pulse 88   Temp 97.8 F (36.6 C) (Oral)   Ht 5\' 3"  (1.6 m)   Wt 119 lb  (54 kg)   BMI 21.08 kg/m  Gen: No acute distress, pleasant, cooperative HEENT: Normocephalic, atraumatic, previously repaired cleft lip. No anterior cervical or supraclavicular lymphadenopathy appreciated. No thyroid masses or nodules palpable. Neuro: Alert, grossly nonfocal, speech at baseline Ext: Full strength bilateral lower extremities. 2+ patellar reflexes bilaterally. Sensation intact over bilateral lower extremities.  ASSESSMENT/PLAN:  Health maintenance:  -utd on health maintenance  Lumbago Stable, no red flags. Refill tramadol for occasional use. Counseled not to use concomitantly with Ambien.  Insomnia Stable on Ambien. Will refill today for 4 months. No unwanted side effects. She has tried lots of other remedies to help her sleep, and truly seems to benefit from Ambien.  Osteoporosis Appears she previously was set up for Reclast injections years ago. She has not had a bone density test in 5 years. I would like to see where her bone density as before deciding on future treatments. She is not a candidate for oral bisphosphonates due to her esophageal issues. She was given the phone number and will call to find out the cost out of pocket for a DEXA scan.  Thyroid nodule No nodules palpable on exam today. Patient not high risk for thyroid cancer. We'll continue to monitor clinically. Follow-up ultrasound not indicated at this time.  FOLLOW UP: Follow up in 4 mos for above issues  Marie Harvey, [place]

## 2016-09-22 NOTE — Assessment & Plan Note (Signed)
No nodules palpable on exam today. Patient not high risk for thyroid cancer. We'll continue to monitor clinically. Follow-up ultrasound not indicated at this time.

## 2017-01-19 ENCOUNTER — Encounter: Payer: Self-pay | Admitting: Family Medicine

## 2017-01-19 ENCOUNTER — Ambulatory Visit (INDEPENDENT_AMBULATORY_CARE_PROVIDER_SITE_OTHER): Payer: Self-pay | Admitting: Family Medicine

## 2017-01-19 VITALS — BP 120/82 | HR 70 | Temp 97.8°F | Ht 63.0 in | Wt 120.0 lb

## 2017-01-19 DIAGNOSIS — E2839 Other primary ovarian failure: Secondary | ICD-10-CM

## 2017-01-19 DIAGNOSIS — M545 Low back pain: Secondary | ICD-10-CM

## 2017-01-19 DIAGNOSIS — Z1239 Encounter for other screening for malignant neoplasm of breast: Secondary | ICD-10-CM

## 2017-01-19 DIAGNOSIS — Z1231 Encounter for screening mammogram for malignant neoplasm of breast: Secondary | ICD-10-CM

## 2017-01-19 DIAGNOSIS — G8929 Other chronic pain: Secondary | ICD-10-CM

## 2017-01-19 DIAGNOSIS — G4700 Insomnia, unspecified: Secondary | ICD-10-CM

## 2017-01-19 DIAGNOSIS — M810 Age-related osteoporosis without current pathological fracture: Secondary | ICD-10-CM

## 2017-01-19 MED ORDER — ZOLPIDEM TARTRATE 5 MG PO TABS: ORAL_TABLET | ORAL | 3 refills | Status: DC

## 2017-01-19 MED ORDER — TRAMADOL HCL 50 MG PO TABS: 50.0000 mg | ORAL_TABLET | Freq: Three times a day (TID) | ORAL | 1 refills | Status: DC | PRN

## 2017-01-19 NOTE — Assessment & Plan Note (Signed)
Again encouraged her to get a DEXA scan.  She will get this in January after her insurance starts.  Order entered.  Patient instructed on how to schedule.

## 2017-01-19 NOTE — Assessment & Plan Note (Signed)
Well-controlled on nightly Ambien.  No unwanted side effects.  Refill provided for 4 months.  Follow-up with me in 4 months.

## 2017-01-19 NOTE — Patient Instructions (Addendum)
It was great to see you again today!  Refilled tramadol and ambien for 4 months.  Ordered mammogram & DEXA scan - call to schedule these in January See me in 4 months  Keep doing the bengay on your knee as needed  Be well, Dr. Ardelia Mems

## 2017-01-19 NOTE — Assessment & Plan Note (Signed)
Well-controlled on as needed tramadol.  Refill provided for 4 months.  Follow-up with me then.

## 2017-01-19 NOTE — Progress Notes (Signed)
Date of Visit: 01/19/2017   HPI:  Patient presents for routine follow-up.  Insomnia: Takes zolpidem 5 mg every night with good relief of her insomnia.  It has been recalcitrant to other in the past.  She gets a good night sleep with this medication.  Denies any side effects.  Tolerating it well.  Just needs a refill.  Back pain: Has known scoliosis with resultant low back pain.  Takes tramadol daily as needed.  Does not take it every single day.  Denies having any side effects from this medication and reports that it works well for her.  Needs a refill. Denies having fever, saddle anesthesia, lower extremity weakness, or problems with stooling or urination.   Knee pain: Has had occasional pain in her left knee.  This has done well with BenGay.  Also takes ibuprofen as needed, not more than a couple of times a week.  Pain is located on the anterior aspect of knee.  Health maintenance: Due for a mammogram and bone density test, but wants to wait on both of these until January as she is planning to get new insurance.  She is fine with an order being placed for each of these today.  She will call and schedule it once her insurance is clarified.  ROS: See HPI.  [place]: History of insomnia, lumbago, osteoporosis, thyroid nodule, GERD, vitamin D deficiency   PHYSICAL EXAM: BP 120/82   Pulse 70   Temp 97.8 F (36.6 C) (Oral)   Ht 5\' 3"  (1.6 m)   Wt 120 lb (54.4 kg)   SpO2 97%   BMI 21.26 kg/m  Gen: no acute distress, pleasant, cooperative, ell-appearing HEENT: Normocephalic, atraumatic Heart: Regular rate and rhythm Lungs: Clear to auscultation bilaterally, normal effort Neuro: Grossly nonfocal, speech normal Ext: Low back nontender to palpation.  Left knee without effusion, tenderness, or deformity.  MCL and LCL intact to varus and valgus stressing.  Negative Lachman's.  ASSESSMENT/PLAN:  Health maintenance:  -DEXA and mammogram ordered, patient will call and schedule once her insurance  is clarified  Osteoporosis Again encouraged her to get a DEXA scan.  She will get this in January after her insurance starts.  Order entered.  Patient instructed on how to schedule.  Insomnia Well-controlled on nightly Ambien.  No unwanted side effects.  Refill provided for 4 months.  Follow-up with me in 4 months.  Lumbago Well-controlled on as needed tramadol.  Refill provided for 4 months.  Follow-up with me then.  Knee pain Mild and intermittent.  No abnormalities on exam.  Responding well to bengay. Continue to use this as needed.  FOLLOW UP: Follow up in 4 months for chronic medical issues.  [place]. Ardelia Mems, [place]

## 2017-02-05 ENCOUNTER — Telehealth: Payer: Self-pay | Admitting: *Deleted

## 2017-02-05 NOTE — Telephone Encounter (Signed)
Received voicemail after last conversation requesting return call. Attempted both numbers. Reviewed call with Dr. Benay Spice: Have pt contact Dr. Joylene Draft for bleeding.

## 2017-02-05 NOTE — Telephone Encounter (Signed)
Spoke with pt, she has not had any additional bleeding. Informed her of Dr. Gearldine Shown recommendation to contact PCP for bleeding. She agreed to do so.

## 2017-04-04 ENCOUNTER — Ambulatory Visit: Payer: Self-pay | Admitting: Oncology

## 2017-04-20 ENCOUNTER — Other Ambulatory Visit: Payer: Self-pay | Admitting: Family Medicine

## 2017-04-20 DIAGNOSIS — Z1231 Encounter for screening mammogram for malignant neoplasm of breast: Secondary | ICD-10-CM

## 2017-05-15 ENCOUNTER — Other Ambulatory Visit: Payer: Self-pay

## 2017-05-15 ENCOUNTER — Ambulatory Visit: Payer: Self-pay

## 2017-05-18 ENCOUNTER — Ambulatory Visit: Payer: BLUE CROSS/BLUE SHIELD | Admitting: Family Medicine

## 2017-05-18 ENCOUNTER — Encounter: Payer: Self-pay | Admitting: Family Medicine

## 2017-05-18 VITALS — BP 130/72 | HR 68 | Temp 98.0°F | Ht 63.0 in | Wt 113.4 lb

## 2017-05-18 DIAGNOSIS — G4700 Insomnia, unspecified: Secondary | ICD-10-CM | POA: Diagnosis not present

## 2017-05-18 DIAGNOSIS — Z0000 Encounter for general adult medical examination without abnormal findings: Secondary | ICD-10-CM

## 2017-05-18 DIAGNOSIS — Z0001 Encounter for general adult medical examination with abnormal findings: Secondary | ICD-10-CM | POA: Diagnosis not present

## 2017-05-18 MED ORDER — ZOLPIDEM TARTRATE 5 MG PO TABS: ORAL_TABLET | ORAL | 5 refills | Status: DC

## 2017-05-18 NOTE — Progress Notes (Signed)
Date of Visit: 05/18/2017   HPI:  Patient presents today for a well woman exam.   Concerns today: no concerns other than refill of ambien. Has noticed some very mild fullness of her left neck that she'd like me to look at. 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 Denies SI/HI. Not interested in counseling or medication to help with symptoms, thinks she is coping okay. Dentist: plans to see soon, has dentist.  Colonoscopy - last had done by Dr. Harrell Lark at [facility]. Had it in 2014 and was told she'd be due for next one in 10 years.  ambien refill - takes [place] 5mg  nightly. Occasionally will take 2 tabs if has a lot of trouble sleeping. toleratin gwell. No side effects. Has found no other sleep strategies that help other than ambien.   ROS: See HPI  [place]:  Cancers in family: mom in 86s and recent dx of colon cancer and bladder cancer.  PHYSICAL EXAM: BP 130/72   Pulse 68   Temp 98 F (36.7 C) (Oral)   Ht 5\' 3"  (1.6 m)   Wt 113 lb 6.4 oz (51.4 kg)   SpO2 98%   BMI 20.09 kg/m  Gen: NAD, pleasant, cooperative HEENT: NCAT, PERRL, no palpable thyromegaly or anterior cervical lymphadenopathy. No swelling or masses of neck identifiable. Heart: RRR, no murmurs Lungs: CTAB, NWOB Abdomen: soft, nontender to palpation Neuro: grossly nonfocal, speech normal Extremities: no edema.   ASSESSMENT/PLAN:  # Health maintenance:  -STD screening: declines today -pap smear: UTD -mammogram: has appointment for this next week -DEXA: has upcoming appointment  -immunizations: current on  vaccines -colonoscopy: will get records to confirm 10 year follow up from colonoscopy in 2014 -handout given on health maintenance topics  Insomnia Stable on ambien. Refill for 6 months provided. Follow up in 6 months.    Neck fullness - reported by patient, not identified on exam today. Monitor, return if worsening.  FOLLOW UP: Follow up in 6 months for insomnia  [place] J. Ardelia Mems, [place]

## 2017-05-18 NOTE — Patient Instructions (Signed)

## 2017-05-21 NOTE — Assessment & Plan Note (Signed)
Stable on ambien. Refill for 6 months provided. Follow up in 6 months.

## 2017-05-30 ENCOUNTER — Ambulatory Visit
Admission: RE | Admit: 2017-05-30 | Discharge: 2017-05-30 | Disposition: A | Discharge status: Home / Self Care | Payer: BLUE CROSS/BLUE SHIELD | Source: Ambulatory Visit | Attending: Family Medicine | Admitting: Family Medicine

## 2017-05-30 DIAGNOSIS — Z1231 Encounter for screening mammogram for malignant neoplasm of breast: Secondary | ICD-10-CM

## 2017-05-30 DIAGNOSIS — E2839 Other primary ovarian failure: Secondary | ICD-10-CM

## 2017-06-01 ENCOUNTER — Other Ambulatory Visit: Payer: Self-pay | Admitting: Family Medicine

## 2017-06-01 DIAGNOSIS — R921 Mammographic calcification found on diagnostic imaging of breast: Secondary | ICD-10-CM

## 2017-06-11 ENCOUNTER — Ambulatory Visit
Admission: RE | Admit: 2017-06-11 | Discharge: 2017-06-11 | Disposition: A | Discharge status: Home / Self Care | Payer: BLUE CROSS/BLUE SHIELD | Source: Ambulatory Visit | Attending: Family Medicine | Admitting: Family Medicine

## 2017-06-11 DIAGNOSIS — R921 Mammographic calcification found on diagnostic imaging of breast: Secondary | ICD-10-CM

## 2017-07-12 ENCOUNTER — Telehealth: Payer: Self-pay | Admitting: Family Medicine

## 2017-07-12 NOTE — Telephone Encounter (Signed)
Patient returned your call. Left callback of [phone].  Danley Danker, RN [facility] RN)

## 2017-07-12 NOTE — Telephone Encounter (Signed)
Attempted to reach patient to discuss DEXA results & osteoporosis management. No answer, LVM asking her to call back.  Plan to recommend she return for BMET & vitamin D testing, in preparation for zoledronic acid IV administration at short stay, if she is agreeable. She cannot take oral bisphosphonates due to her esophageal issues.  I will be out of the office for the next 2 weeks but Dr. Mingo Amber & Dr. Erin Hearing are covering for me.  Leeanne Rio, MD

## 2017-07-13 NOTE — Telephone Encounter (Signed)
Pt returning call, says its ok to leave a message on her voicemail. Call back [phone] Wallace Cullens, RN

## 2017-07-13 NOTE — Telephone Encounter (Signed)
Called Left vm to call and leave times and number and I will call her back  [place]

## 2017-08-14 ENCOUNTER — Ambulatory Visit (INDEPENDENT_AMBULATORY_CARE_PROVIDER_SITE_OTHER): Payer: BLUE CROSS/BLUE SHIELD | Admitting: Family Medicine

## 2017-08-14 VITALS — BP 110/60 | HR 67 | Temp 97.8°F | Ht 63.0 in | Wt 117.4 lb

## 2017-08-14 DIAGNOSIS — G4700 Insomnia, unspecified: Secondary | ICD-10-CM

## 2017-08-14 DIAGNOSIS — M810 Age-related osteoporosis without current pathological fracture: Secondary | ICD-10-CM

## 2017-08-14 DIAGNOSIS — F339 Major depressive disorder, recurrent, unspecified: Secondary | ICD-10-CM | POA: Diagnosis not present

## 2017-08-14 MED ORDER — MIRTAZAPINE 15 MG PO TABS: 15.0000 mg | ORAL_TABLET | Freq: Every day | ORAL | 1 refills | Status: DC

## 2017-08-14 NOTE — Progress Notes (Signed)
Date of Visit: 08/14/2017   HPI:  Patient presents for follow up and to discuss DEXA scan results.  Osteoporosis - patient with known history of osteoporosis. Recent DEXA confirmed continued decrease in BMD. Patient not able to take oral bisphosphonates due to history of esophageal strictures. Willing to get reclast infusion at short stay.  Mood - has felt very down lately. Her mother died in 07-05-2022 shortly after I last saw her. No active SI or HI, but has had passive thoughts that maybe things would be better if she wasn't around. No plans to harm herself at all and does not intend to harm herself. Feels isolated. Having difficulty sleeping even with ambien 5mg  nightly. Agreeable to trying medication to help with mood and sleep.  ROS: See HPI.  [place]: history of insomnia, osteoporosis, depression, repaired tetralogy of fallot  PHYSICAL EXAM: BP 110/60 (BP Location: Right Arm, Patient Position: Sitting, Cuff Size: Normal)   Pulse 67   Temp 97.8 F (36.6 C) (Oral)   Ht 5\' 3"  (1.6 m)   Wt 117 lb 6.4 oz (53.3 kg)   SpO2 98%   BMI 20.80 kg/m  Gen: no acute distress, pleasant, cooperative HEENT: normocephalic, atraumatic, moist mucous membranes  Psych: normal range of affect but primarily sad. well groomed, speech normal in rate and volume, normal eye contact   ASSESSMENT/PLAN:  Health maintenance:  -current on HM items  Insomnia Adding remeron to medication regimen to help mood and sleep. Will also hopefully help maintain healthy weight as patient is thin.  Osteoporosis Check CMET & vit D level today to ensure renal function, calcium, and vit D are at acceptable levels for reclast infusion.  Major depressive disorder, recurrent episode Endorses symptoms of recurrent depression after the death of her mother in Jul 05, 2022. No active SI. Patient agreeable to starting remeron to help with mood and sleep. Also gave info on grief counseling through [facility].  Follow up with me in 2-3 weeks to see how she is doing on the remeron.  FOLLOW UP: Follow up in 2-3 weeks for depression  [place] J. Ardelia Mems, [place]

## 2017-08-14 NOTE — Patient Instructions (Addendum)
It was great to see you again today.  For grief counseling, call [facility] at [phone].  For mood, start remeron 15mg  at bedtime. Follow up with me in 2-3 weeks to see how you're doing on this.  Checking labwork today to see if we can start the zoledronic acid.  Be well, Dr. Ardelia Mems

## 2017-08-15 LAB — CMP14+EGFR
ALT: 16 IU/L (ref 0–32)
AST: 17 IU/L (ref 0–40)
Albumin/Globulin Ratio: 2 (ref 1.2–2.2)
Albumin: 4.5 g/dL (ref 3.6–4.8)
Alkaline Phosphatase: 106 IU/L (ref 39–117)
BUN/Creatinine Ratio: 20 (ref 12–28)
BUN: 13 mg/dL (ref 8–27)
Bilirubin Total: 0.7 mg/dL (ref 0.0–1.2)
CO2: 27 mmol/L (ref 20–29)
Calcium: 9.9 mg/dL (ref 8.7–10.3)
Chloride: 101 mmol/L (ref 96–106)
Creatinine, Ser: 0.65 mg/dL (ref 0.57–1.00)
GFR calc Af Amer: 109 mL/min/{1.73_m2} (ref >59)
GFR calc non Af Amer: 95 mL/min/{1.73_m2} (ref >59)
Globulin, Total: 2.2 g/dL (ref 1.5–4.5)
Glucose: 91 mg/dL (ref 65–99)
Potassium: 4.8 mmol/L (ref 3.5–5.2)
Sodium: 142 mmol/L (ref 134–144)
Total Protein: 6.7 g/dL (ref 6.0–8.5)

## 2017-08-15 LAB — VITAMIN D 25 HYDROXY (VIT D DEFICIENCY, FRACTURES): Vit D, 25-Hydroxy: 21.7 ng/mL — ABNORMAL LOW (ref 30.0–100.0)

## 2017-08-17 ENCOUNTER — Telehealth: Payer: Self-pay | Admitting: Family Medicine

## 2017-08-17 NOTE — Telephone Encounter (Signed)
Attempted to reach patient to discuss lab results. Vit D is high enough to allow for zoledronic acid administration.  No answer, phone line busy x2. I will call her next week to discuss setting up reclast injections. I will be out of the office Mon-Thurs but will try to reach her when I return.  Leeanne Rio, MD

## 2017-08-19 NOTE — Assessment & Plan Note (Signed)
Adding remeron to medication regimen to help mood and sleep. Will also hopefully help maintain healthy weight as patient is thin.

## 2017-08-19 NOTE — Assessment & Plan Note (Signed)
Endorses symptoms of recurrent depression after the death of her mother in 06-18-22. No active SI. Patient agreeable to starting remeron to help with mood and sleep. Also gave info on grief counseling through [facility]. Follow up with me in 2-3 weeks to see how she is doing on the remeron.

## 2017-08-19 NOTE — Assessment & Plan Note (Signed)
Check CMET & vit D level today to ensure renal function, calcium, and vit D are at acceptable levels for reclast infusion.

## 2017-09-11 ENCOUNTER — Other Ambulatory Visit: Payer: Self-pay

## 2017-09-11 ENCOUNTER — Ambulatory Visit: Payer: BLUE CROSS/BLUE SHIELD | Admitting: Family Medicine

## 2017-09-11 ENCOUNTER — Encounter: Payer: Self-pay | Admitting: Family Medicine

## 2017-09-11 DIAGNOSIS — F339 Major depressive disorder, recurrent, unspecified: Secondary | ICD-10-CM | POA: Diagnosis not present

## 2017-09-11 DIAGNOSIS — M810 Age-related osteoporosis without current pathological fracture: Secondary | ICD-10-CM

## 2017-09-11 MED ORDER — MIRTAZAPINE 30 MG PO TABS: 30.0000 mg | ORAL_TABLET | Freq: Every day | ORAL | 0 refills | Status: DC

## 2017-09-11 NOTE — Progress Notes (Signed)
Date of Visit: 09/11/2017   HPI:  Patient presents for follow up of depression & osteoporosis.  Depression - currently taking remeron 15mg  at bedtime. Feeling some better on this medication. Not helping with sleep but mood is slightly better. No SI/HI. Agreeable to dose increase. Still would like to see grief counseling through hospice but would like [place] to schedule the first appointment on her behalf.  Osteoporosis - had labs drawn last visit to check ability to give reclast, which were acceptable. She is agreeable to reclast. I had trouble contacting her via phone. She has mychart and can check messages on there.  ROS: See HPI.  [place]: history of depression, insomnia, osteoporosis, lumbago  PHYSICAL EXAM: BP 132/82 (BP Location: Right Arm, Patient Position: Sitting, Cuff Size: Normal)   Pulse 81   Temp 97.7 F (36.5 C) (Oral)   Ht 5\' 4"  (1.626 m)   Wt 121 lb (54.9 kg)   SpO2 98%   BMI 20.77 kg/m  Gen: no acute distress, pleasant, cooperative, well appearing Psych: normal range of affect, well groomed, speech normal in rate and volume, normal eye contact   ASSESSMENT/PLAN:  Health maintenance:  -UTD on HM items  Major depressive disorder, recurrent episode Somewhat improved. No SI/HI. Increase remeron to 30mg  nightly. Follow up with me in early August. Will schedule first hospice grief counseling session for patient & send appointment info through [facility].  Osteoporosis Labs acceptable for reclast. Will arrange this at short stay and send patient appointment info via mychart.  FOLLOW UP: Follow up in early August for chronic medical problems  [place] J. Ardelia Mems, [place]

## 2017-09-11 NOTE — Patient Instructions (Signed)
It was great to see you again today!  Will set up reclast injection and send you message in mychart with appointment Will also schedule hospice grief counseling and send you message with appointment  Go up on remeron to 30mg  nightly. Sent in a new prescription.  Follow up with me in early August.  Be well, Dr. Ardelia Mems

## 2017-09-14 NOTE — Assessment & Plan Note (Signed)
Labs acceptable for reclast. Will arrange this at short stay and send patient appointment info via mychart.

## 2017-09-14 NOTE — Assessment & Plan Note (Addendum)
Somewhat improved. No SI/HI. Increase remeron to 30mg  nightly. Follow up with me in early August. Will schedule first hospice grief counseling session for patient & send appointment info through [place].

## 2017-09-21 ENCOUNTER — Telehealth: Payer: Self-pay | Admitting: Family Medicine

## 2017-09-21 ENCOUNTER — Encounter: Payer: Self-pay | Admitting: Family Medicine

## 2017-09-21 NOTE — Telephone Encounter (Signed)
Attempted to reach patient to discuss the reclast infusion at short stay. Unfortunately her labs have to be within the last 30 days or they will not infuse it. No answer on phone call. I will send a message to her in [place].  Leeanne Rio, MD

## 2017-10-23 ENCOUNTER — Other Ambulatory Visit: Payer: Self-pay

## 2017-10-23 ENCOUNTER — Encounter: Payer: Self-pay | Admitting: Family Medicine

## 2017-10-23 ENCOUNTER — Ambulatory Visit: Payer: BLUE CROSS/BLUE SHIELD | Admitting: Family Medicine

## 2017-10-23 VITALS — BP 128/78 | HR 65 | Temp 97.8°F | Ht 64.0 in | Wt 126.0 lb

## 2017-10-23 DIAGNOSIS — M810 Age-related osteoporosis without current pathological fracture: Secondary | ICD-10-CM

## 2017-10-23 DIAGNOSIS — Z8774 Personal history of (corrected) congenital malformations of heart and circulatory system: Secondary | ICD-10-CM | POA: Diagnosis not present

## 2017-10-23 DIAGNOSIS — G4700 Insomnia, unspecified: Secondary | ICD-10-CM

## 2017-10-23 DIAGNOSIS — F339 Major depressive disorder, recurrent, unspecified: Secondary | ICD-10-CM | POA: Diagnosis not present

## 2017-10-23 MED ORDER — ZOLPIDEM TARTRATE 5 MG PO TABS: ORAL_TABLET | ORAL | 5 refills | Status: DC

## 2017-10-23 MED ORDER — TETANUS-DIPHTH-ACELL PERTUSSIS 5-2.5-18.5 LF-MCG/0.5 IM SUSP: 0.5000 mL | Freq: Once | INTRAMUSCULAR | 0 refills | Status: AC

## 2017-10-23 NOTE — Assessment & Plan Note (Signed)
Patient inquired about whether she should have another echo. Is currently asymptomatic. Reviewed last echo. Will hold on repeating echo unless she develops symptoms.

## 2017-10-23 NOTE — Progress Notes (Signed)
Date of Visit: 10/23/2017   HPI:  Patient presents for routine follow up.  Osteoporosis - due for labs today so that we can set up reclast injection. Not taking vitamin D but willing to do so. Has not tolerated oral calcium in the past.  Depression - previously prescribed remeron but has been out of it for 1 week and does not want to continue with it. Met with grief counselor at hospice yesterday and thought it was helpful. May follow up with the counselor, not yet sure if she plans to go back. Denies thoughts of self harm or harming others. Thinks mood overall is doing better.  History of tetralogy of fallot - had surgical repair as a child. Denies chest pain, shortness of breath, palpitations, swelling, or presyncopal symptoms.   Insomnia - taking ambien 42m at night. Tolerating well. Denies any nighttime sleepwalking or abnormal behaviors. Helps her sleep a lot.   ROS: See HPI.  [place] history of depression, osteporosis, insomnia, lumbago  PHYSICAL EXAM: BP 128/78   Pulse 65   Temp 97.8 F (36.6 C) (Oral)   Ht '5\' 4"'  (1.626 m)   Wt 126 lb (57.2 kg)   SpO2 97%   BMI 21.63 kg/m  Gen: no acute distress, pleasant, cooperative HEENT: normocephalic, atraumatic, moist mucous membranes  Heart: regular rate and rhythm, murmur present Lungs: clear to auscultation bilaterally, normal work of breathing   Neuro: alert, grossly nonfocal Ext: No appreciable lower extremity edema bilaterally  Psych: normal range of affect, well groomed, speech normal in rate and volume, normal eye contact   ASSESSMENT/PLAN:  Health maintenance:  -Tdap rx given today, instructed to get at her pharmacy -colonoscopy report requested (appears last had it in January 2015)  Osteoporosis Check labs today - BMET for creatinine & calcium, also vitamin D Once labs are back will set up for reclast injection at short stay Start vit D 800 IU daily  Major depressive disorder, recurrent episode Mood doing better  and patient prefers to stay off remeron. Continue to monitor. She will call if she has recurrent mood issues, otherwise follow up in 4 months.   Insomnia Stable. Refill ambien for 6 months.   History of tetralogy of Fallot repair Patient inquired about whether she should have another echo. Is currently asymptomatic. Reviewed last echo. Will hold on repeating echo unless she develops symptoms.   FOLLOW UP: Follow up in 4 mos for above issues  [place]. MArdelia Mems [place]

## 2017-10-23 NOTE — Assessment & Plan Note (Addendum)
Check labs today - BMET for creatinine & calcium, also vitamin D Once labs are back will set up for reclast injection at short stay Start vit D 800 IU daily

## 2017-10-23 NOTE — Patient Instructions (Signed)
Rechecking labs today. Will set up reclast injection.  Sent in refills on ambien  Follow up with me in December or  January, sooner if needed.  Be well, Dr. Ardelia Mems

## 2017-10-23 NOTE — Assessment & Plan Note (Signed)
Stable. Refill ambien for 6 months.

## 2017-10-23 NOTE — Assessment & Plan Note (Signed)
Mood doing better and patient prefers to stay off remeron. Continue to monitor. She will call if she has recurrent mood issues, otherwise follow up in 4 months.

## 2017-10-24 ENCOUNTER — Encounter: Payer: Self-pay | Admitting: Family Medicine

## 2017-10-24 LAB — BASIC METABOLIC PANEL
BUN/Creatinine Ratio: 21 (ref 12–28)
BUN: 14 mg/dL (ref 8–27)
CO2: 25 mmol/L (ref 20–29)
Calcium: 9.6 mg/dL (ref 8.7–10.3)
Chloride: 102 mmol/L (ref 96–106)
Creatinine, Ser: 0.68 mg/dL (ref 0.57–1.00)
GFR calc Af Amer: 108 mL/min/{1.73_m2} (ref >59)
GFR calc non Af Amer: 93 mL/min/{1.73_m2} (ref >59)
Glucose: 71 mg/dL (ref 65–99)
Potassium: 4.8 mmol/L (ref 3.5–5.2)
Sodium: 143 mmol/L (ref 134–144)

## 2017-10-24 LAB — VITAMIN D 25 HYDROXY (VIT D DEFICIENCY, FRACTURES): Vit D, 25-Hydroxy: 21.9 ng/mL — ABNORMAL LOW (ref 30.0–100.0)

## 2017-10-28 ENCOUNTER — Other Ambulatory Visit: Payer: Self-pay | Admitting: Family Medicine

## 2017-10-28 ENCOUNTER — Encounter: Payer: Self-pay | Admitting: Family Medicine

## 2017-10-29 MED ORDER — MIRTAZAPINE 30 MG PO TABS: 30.0000 mg | ORAL_TABLET | Freq: Every day | ORAL | 1 refills | Status: DC

## 2017-10-29 MED ORDER — CHOLECALCIFEROL 10 MCG (400 UNIT) PO TABS: 800.0000 [IU] | ORAL_TABLET | Freq: Every day | ORAL | 2 refills | Status: DC

## 2017-11-02 ENCOUNTER — Other Ambulatory Visit (HOSPITAL_COMMUNITY): Payer: Self-pay | Admitting: *Deleted

## 2017-11-05 ENCOUNTER — Encounter (HOSPITAL_COMMUNITY)
Admission: RE | Admit: 2017-11-05 | Discharge: 2017-11-05 | Disposition: A | Discharge status: Home / Self Care | Payer: BLUE CROSS/BLUE SHIELD | Source: Ambulatory Visit | Attending: Family Medicine | Admitting: Family Medicine

## 2017-11-05 DIAGNOSIS — M810 Age-related osteoporosis without current pathological fracture: Secondary | ICD-10-CM | POA: Insufficient documentation

## 2017-11-05 MED ORDER — ZOLEDRONIC ACID 5 MG/100ML IV SOLN
INTRAVENOUS | Status: AC
  Filled 2017-11-05: qty 100

## 2017-11-05 MED ORDER — ZOLEDRONIC ACID 5 MG/100ML IV SOLN
5.0000 mg | Freq: Once | INTRAVENOUS | Status: AC
  Administered 2017-11-05: 5 mg via INTRAVENOUS

## 2019-01-28 ENCOUNTER — Encounter: Payer: BLUE CROSS/BLUE SHIELD | Admitting: Family Medicine

## 2019-02-19 ENCOUNTER — Telehealth: Payer: Self-pay | Admitting: *Deleted

## 2019-02-19 ENCOUNTER — Encounter: Payer: Self-pay | Admitting: Family Medicine

## 2019-02-19 NOTE — Telephone Encounter (Signed)
Brother, Assunta Found, calls to discuss some things with Dr. Ardelia Mems.  Pt works at [employer] which is now "100% covid positive" and he feels like the patient should be taken out of work.  She is asymptomatic but her last work day was yesterday.    Spoke with Dr. Ardelia Mems, she will call and discuss with patient.   Christen Bame, CMA

## 2019-02-19 NOTE — Telephone Encounter (Signed)
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 Leeanne Rio, MD

## 2019-02-19 NOTE — Telephone Encounter (Signed)
Confirmed we have DPR on file authorizing discussion of pt's PHI with pt's brother Day (we do).   Called brother, no answer. LVM (without any PHI) asking him to call back.  Leeanne Rio, MD

## 2019-02-20 ENCOUNTER — Ambulatory Visit (INDEPENDENT_AMBULATORY_CARE_PROVIDER_SITE_OTHER): Payer: Medicare Other | Admitting: Family Medicine

## 2019-02-20 ENCOUNTER — Other Ambulatory Visit: Payer: Self-pay

## 2019-02-20 DIAGNOSIS — F339 Major depressive disorder, recurrent, unspecified: Secondary | ICD-10-CM | POA: Diagnosis not present

## 2019-02-20 DIAGNOSIS — Z0001 Encounter for general adult medical examination with abnormal findings: Secondary | ICD-10-CM

## 2019-02-20 DIAGNOSIS — G4700 Insomnia, unspecified: Secondary | ICD-10-CM

## 2019-02-20 DIAGNOSIS — Z23 Encounter for immunization: Secondary | ICD-10-CM

## 2019-02-20 MED ORDER — TETANUS-DIPHTH-ACELL PERTUSSIS 5-2.5-18.5 LF-MCG/0.5 IM SUSP: 0.5000 mL | Freq: Once | INTRAMUSCULAR | 0 refills | Status: AC

## 2019-02-20 MED ORDER — MIRTAZAPINE 30 MG PO TABS: 30.0000 mg | ORAL_TABLET | Freq: Every day | ORAL | 1 refills | Status: DC

## 2019-02-20 MED ORDER — ZOLPIDEM TARTRATE 5 MG PO TABS: ORAL_TABLET | ORAL | 5 refills | Status: DC

## 2019-02-20 NOTE — Patient Instructions (Signed)
It was great to see you again today!  Refilled mirtazapine and ambien Follow up with me in 1 month for mood  Go get tetanus shot at pharmacy Pneumonia shot today  Find out where your insurance will let you go for colonoscopy  Be well, Dr. Ardelia Mems      Health Maintenance After Age 65 After age 33, you are at a higher risk for certain long-term diseases and infections as well as injuries from falls. Falls are a major cause of broken bones and head injuries in people who are older than age 27. Getting regular preventive care can help to keep you healthy and well. Preventive care includes getting regular testing and making lifestyle changes as recommended by your health care provider. Talk with your health care provider about:  Which screenings and tests you should have. A screening is a test that checks for a disease when you have no symptoms.  A diet and exercise plan that is right for you. What should I know about screenings and tests to prevent falls? Screening and testing are the best ways to find a health problem early. Early diagnosis and treatment give you the best chance of managing medical conditions that are common after age 87. Certain conditions and lifestyle choices may make you more likely to have a fall. Your health care provider may recommend:  Regular vision checks. Poor vision and conditions such as cataracts can make you more likely to have a fall. If you wear glasses, make sure to get your prescription updated if your vision changes.  Medicine review. Work with your health care provider to regularly review all of the medicines you are taking, including over-the-counter medicines. Ask your health care provider about any side effects that may make you more likely to have a fall. Tell your health care provider if any medicines that you take make you feel dizzy or sleepy.  Osteoporosis screening. Osteoporosis is a condition that causes the bones to get weaker. This can make  the bones weak and cause them to break more easily.  Blood pressure screening. Blood pressure changes and medicines to control blood pressure can make you feel dizzy.  Strength and balance checks. Your health care provider may recommend certain tests to check your strength and balance while standing, walking, or changing positions.  Foot health exam. Foot pain and numbness, as well as not wearing proper footwear, can make you more likely to have a fall.  Depression screening. You may be more likely to have a fall if you have a fear of falling, feel emotionally low, or feel unable to do activities that you used to do.  Alcohol use screening. Using too much alcohol can affect your balance and may make you more likely to have a fall. What actions can I take to lower my risk of falls? General instructions  Talk with your health care provider about your risks for falling. Tell your health care provider if: ? You fall. Be sure to tell your health care provider about all falls, even ones that seem minor. ? You feel dizzy, sleepy, or off-balance.  Take over-the-counter and prescription medicines only as told by your health care provider. These include any supplements.  Eat a healthy diet and maintain a healthy weight. A healthy diet includes low-fat dairy products, low-fat (lean) meats, and fiber from whole grains, beans, and lots of fruits and vegetables. Home safety  Remove any tripping hazards, such as rugs, cords, and clutter.  Install safety equipment such  as grab bars in bathrooms and safety rails on stairs.  Keep rooms and walkways well-lit. Activity   Follow a regular exercise program to stay fit. This will help you maintain your balance. Ask your health care provider what types of exercise are appropriate for you.  If you need a cane or walker, use it as recommended by your health care provider.  Wear supportive shoes that have nonskid soles. Lifestyle  Do not drink alcohol if  your health care provider tells you not to drink.  If you drink alcohol, limit how much you have: ? 0-1 drink a day for women. ? 0-2 drinks a day for men.  Be aware of how much alcohol is in your drink. In the [place] one drink equals one typical bottle of beer (12 oz), one-half glass of wine (5 oz), or one shot of hard liquor (1 oz).  Do not use any products that contain nicotine or tobacco, such as cigarettes and e-cigarettes. If you need help quitting, ask your health care provider. Summary  Having a healthy lifestyle and getting preventive care can help to protect your health and wellness after age 68.  Screening and testing are the best way to find a health problem early and help you avoid having a fall. Early diagnosis and treatment give you the best chance for managing medical conditions that are more common for people who are older than age 12.  Falls are a major cause of broken bones and head injuries in people who are older than age 4. Take precautions to prevent a fall at home.  Work with your health care provider to learn what changes you can make to improve your health and wellness and to prevent falls. This information is not intended to replace advice given to you by your health care provider. Make sure you discuss any questions you have with your health care provider. Document Released: 01/17/2017 Document Revised: 06/27/2018 Document Reviewed: 01/17/2017 Elsevier Patient Education  2020 [employer].

## 2019-02-20 NOTE — Progress Notes (Signed)
Date of Visit: 02/20/2019   HPI:  Patient presents for annual physical examination. She briefly went to another PCP office [name] earlier this year but has new insurance and plans to resume care here.  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 High risk of COVID complications due to history of tetralogy of fallot repair.  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 She notes preferring sodas instead of water -Has not taken mirtazapine for 1 year after her prescription was not renewed; notes she felt better while taking this medication -Denies SI, HI  ROS: See HPI.  [place]: past medical history: lumbago, insomnia, osteoporosis, depression  PHYSICAL EXAM: BP 139/80   Pulse 65   Wt 115 lb 3.2 oz (52.3 kg)   SpO2 98%   BMI 19.77 kg/m  Gen: well appearing female who appears her stated age, remains attentive and engaged during conversations HEENT: normocephalic, atraumatic Heart: regular rate, + murmur appreciated Lungs: clear to auscultation bilaterally, normal work of breathing, no wheezes or crackles Neuro: alert, grossly nonfocal, speech normal Psych: normal range of affect, well groomed, speech normal in rate and volume, normal eye contact   ASSESSMENT/PLAN:  Health maintenance:  - Colonoscopy - patient tasked with scheduling her colonoscopy in 2021 once her insurance allows, she will check with them on where she can get it that will be covered - TDAP - ordered prescription for [name], gave printed rx -  Received her influenza vaccination in October - PCV 23 - received during (12/3) clinic visit today  Patient given written letter to support leave of absence from work given high risk of complications from [id number]  Insomnia Stable overall. Refill zolpidem 5mg  at night as needed. Discussed will not go up higher than this dose given her age and sex.  Major depressive disorder, recurrent episode Worsened lately, no SI. Restart mirtazapine. Follow up in 1 month to eval for improvement.  FOLLOW UP: Follow up in 1 month to evaluate patient's response to mirtazapine to tx MDD   Marie Harvey, [facility]  Patient seen along with MS3 student Marie Harvey. I personally evaluated this patient along with the student, and verified all aspects of the history, physical exam, and medical decision making as documented by the student. I agree with the student's documentation and have made all necessary edits.  [name], MD [place]

## 2019-02-20 NOTE — Assessment & Plan Note (Signed)
Stable overall. Refill zolpidem 5mg  at night as needed. Discussed will not go up higher than this dose given her age and sex.

## 2019-02-20 NOTE — Assessment & Plan Note (Signed)
Worsened lately, no SI. Restart mirtazapine. Follow up in 1 month to eval for improvement.

## 2019-02-28 ENCOUNTER — Telehealth: Payer: Self-pay | Admitting: *Deleted

## 2019-02-28 NOTE — Telephone Encounter (Signed)
Returned call to patient. Monday woke up very tired. Tested on Tuesday, found out positive for COVID on Thursday. Feels tired. Never had a fever. No significant other symptoms. Doing okay now. Advised of reasons to go to ER (chest pain, shortness of breath, trouble breathing). Otherwise she should isolate at home until at least 12/17. Advised to call us back with any questions or concerns. Advised supportive care, no available medications to treat this at this time. Patient appreciative.  Leeanne Rio, MD

## 2019-02-28 NOTE — Telephone Encounter (Signed)
Pt states that she received her results yesterday, that she tested positive for covid.  She wants to know what medication we can give her for covid.    She is not coughing and not running a fever, only congestion.  She does not understand why we are not giving her medication.   She request to speak with Dr. Ardelia Mems.  To PCP. Christen Bame, CMA

## 2019-05-05 ENCOUNTER — Other Ambulatory Visit: Payer: Self-pay

## 2019-05-05 ENCOUNTER — Ambulatory Visit (INDEPENDENT_AMBULATORY_CARE_PROVIDER_SITE_OTHER): Payer: Medicare Other | Admitting: Family Medicine

## 2019-05-05 ENCOUNTER — Encounter: Payer: Self-pay | Admitting: Family Medicine

## 2019-05-05 VITALS — BP 124/76 | HR 67 | Wt 114.6 lb

## 2019-05-05 DIAGNOSIS — F339 Major depressive disorder, recurrent, unspecified: Secondary | ICD-10-CM

## 2019-05-05 DIAGNOSIS — H93A9 Pulsatile tinnitus, unspecified ear: Secondary | ICD-10-CM

## 2019-05-05 DIAGNOSIS — H9313 Tinnitus, bilateral: Secondary | ICD-10-CM

## 2019-05-05 MED ORDER — TETANUS-DIPHTH-ACELL PERTUSSIS 5-2.5-18.5 LF-MCG/0.5 IM SUSP: 0.5000 mL | Freq: Once | INTRAMUSCULAR | 0 refills | Status: AC

## 2019-05-05 MED ORDER — ZOLPIDEM TARTRATE 5 MG PO TABS: ORAL_TABLET | ORAL | 2 refills | Status: DC

## 2019-05-05 MED ORDER — MIRTAZAPINE 30 MG PO TABS: 30.0000 mg | ORAL_TABLET | Freq: Every day | ORAL | 1 refills | Status: DC

## 2019-05-05 NOTE — Progress Notes (Signed)
     CHIEF COMPLAINT / HPI: Left ear ringing   Depression/Insomnia: - notes improvement of mood since last visit and starting mirtazapine. Notices difference in her mood since she recently ran out of mirtazapine - her brother and his wife recently moved in with patient. She enjoys their company. Added stressor as brother's wife is getting rid of patient's mother's belongings  - Patient continues to sleep well due to zolpidem and mirtazapine. Denies recent falls, changes to mental status or vivid nightmares - answered PHQ-9 question 9 as 1, but on further discussion with patient she has no thoughts of self harm or of harming others. 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   Tinnitus  - Patient has a history of b/l sensorineural hearing loss with right sided tinnitus - States ever since her 1st covid vaccination she has had left sided tinnitus that she describes as a pulsatile, crackling sound  - denies dizziness, vertigo, or lightheadedness   OBJECTIVE: BP 124/76   Pulse 67   Wt 114 lb 9.6 oz (52 kg)   SpO2 99%   BMI 19.67 kg/m   Physical Exam  Constitutional: She is well-developed, well-nourished, and in no distress. No distress.  HENT:  Right Ear: Tympanic membrane and ear canal normal. No drainage or swelling. Tympanic membrane is not erythematous and not bulging.  Left Ear: Tympanic membrane and ear canal normal. No drainage or swelling. Tympanic membrane is not erythematous and not bulging.  Cardiovascular: Normal rate and regular rhythm.  Unspecified cardiac murmur   Pulmonary/Chest: Breath sounds normal. No respiratory distress. She has no wheezes.  Abdominal: Soft. She exhibits no distension. There is no abdominal tenderness.   ASSESSMENT / PLAN:   Tinnitus Recent onset left sided pulsatile tinnitus. Will send referral to ENT.    Major depressive disorder, recurrent  episode Noted improvement in mood and sleep. PHQ9 this visit was 5. Refilled mirtazapine.   Health Maintenance: - patient release documentation obtained for colonoscopy records from [facility] - Tdap prescription  - given advance directive paperwork   Juanna Cao, Medical Student    Patient seen along with MS3 student [name]. I personally evaluated this patient along with the student, and verified all aspects of the history, physical exam, and medical decision making as documented by the student. I agree with the student's documentation and have made all necessary edits.  [name], MD [place]

## 2019-05-05 NOTE — Patient Instructions (Signed)
It was great to see you again today!  Fill out Advance Directives paperwork  Referring to ENT Refilled medicines  Follow up with me in 3 months.   Be well, Dr. Ardelia Mems

## 2019-05-05 NOTE — Assessment & Plan Note (Addendum)
Recent onset left sided pulsatile tinnitus. Will send referral to ENT.

## 2019-05-05 NOTE — Assessment & Plan Note (Signed)
Noted improvement in mood and sleep. PHQ9 this visit was 5. Refilled mirtazapine.

## 2019-05-12 ENCOUNTER — Other Ambulatory Visit: Payer: Self-pay | Admitting: Family Medicine

## 2019-05-12 DIAGNOSIS — Z1231 Encounter for screening mammogram for malignant neoplasm of breast: Secondary | ICD-10-CM

## 2019-06-13 ENCOUNTER — Ambulatory Visit: Payer: Medicare Other

## 2019-07-11 ENCOUNTER — Ambulatory Visit: Payer: Medicare Other

## 2019-07-25 ENCOUNTER — Other Ambulatory Visit: Payer: Self-pay

## 2019-07-25 ENCOUNTER — Ambulatory Visit
Admission: RE | Admit: 2019-07-25 | Discharge: 2019-07-25 | Disposition: A | Discharge status: Home / Self Care | Payer: Medicare Other | Source: Ambulatory Visit | Attending: Family Medicine | Admitting: Family Medicine

## 2019-07-25 DIAGNOSIS — Z1231 Encounter for screening mammogram for malignant neoplasm of breast: Secondary | ICD-10-CM

## 2019-08-11 ENCOUNTER — Ambulatory Visit: Payer: Medicare Other | Admitting: Family Medicine

## 2019-08-15 ENCOUNTER — Ambulatory Visit (INDEPENDENT_AMBULATORY_CARE_PROVIDER_SITE_OTHER): Payer: Medicare Other | Admitting: Family Medicine

## 2019-08-15 ENCOUNTER — Encounter: Payer: Self-pay | Admitting: Family Medicine

## 2019-08-15 ENCOUNTER — Other Ambulatory Visit: Payer: Self-pay

## 2019-08-15 VITALS — BP 122/76 | HR 60 | Temp 97.5°F | Wt 107.0 lb

## 2019-08-15 DIAGNOSIS — Z13220 Encounter for screening for lipoid disorders: Secondary | ICD-10-CM | POA: Diagnosis not present

## 2019-08-15 DIAGNOSIS — G4700 Insomnia, unspecified: Secondary | ICD-10-CM | POA: Diagnosis not present

## 2019-08-15 DIAGNOSIS — M810 Age-related osteoporosis without current pathological fracture: Secondary | ICD-10-CM | POA: Diagnosis not present

## 2019-08-15 DIAGNOSIS — H9313 Tinnitus, bilateral: Secondary | ICD-10-CM

## 2019-08-15 DIAGNOSIS — F339 Major depressive disorder, recurrent, unspecified: Secondary | ICD-10-CM | POA: Diagnosis not present

## 2019-08-15 MED ORDER — TETANUS-DIPHTH-ACELL PERTUSSIS 5-2.5-18.5 LF-MCG/0.5 IM SUSP: 0.5000 mL | Freq: Once | INTRAMUSCULAR | 0 refills | Status: AC

## 2019-08-15 MED ORDER — ZOLPIDEM TARTRATE 5 MG PO TABS: ORAL_TABLET | ORAL | 5 refills | Status: DC

## 2019-08-15 NOTE — Patient Instructions (Signed)
Go get your tetanus shot at your pharmacy  Call ENT office for an appointment - [phone] (Dr. Lucia Gaskins)  Checking labs today  Refilled ambien  Follow up with me in 6 months, sooner if needed  Be well, Dr. Ardelia Mems

## 2019-08-15 NOTE — Assessment & Plan Note (Signed)
It appears ENT office left patient a message back in March, which she says she never received Provided phone # for ENT office so she can schedule with them.

## 2019-08-15 NOTE — Assessment & Plan Note (Signed)
Doing well off medications. Continue to monitor.

## 2019-08-15 NOTE — Progress Notes (Signed)
  Date of Visit: 08/15/2019   SUBJECTIVE:   HPI:  Marie Harvey presents today for routine follow up.  Insomnia - taking ambien 5mg  every night. Tolerating well without excessive sedation. Helping her sleep.  Depression - ran out of mirtazapine in mid -April and has not had it since then. Feels mood is good without this medication. No SI/HI. Sleeping well with ambien.  Osteoporosis - last reclast injection was in August 2019. Having some oral surgery coming up soon and is taking vitamin D3 5000u twice daily per her oral surgeon's recommendations. Patient prefers to hold off on another reclast injection until her teeth surgery is complete.  Pulsatile tinnitus - still happening on occasion. Referred to ENT last visit but patient never scheduled appointment.  OBJECTIVE:   BP 122/76   Pulse 60   Temp (!) 97.5 F (36.4 C) (Oral)   Wt 107 lb (48.5 kg)   SpO2 98%   BMI 18.37 kg/m  Gen: no acute distress, pleasant, cooperative HEENT: normocephalic, atraumatic  Heart: regular rate and rhythm Lungs: clear to auscultation bilaterally normal work of breathing  Neuro: alert speech normal Ext: No appreciable lower extremity edema bilaterally  Psych: normal range of affect, well groomed, speech normal in rate and volume, normal eye contact   ASSESSMENT/PLAN:   Health maintenance:  -has received both doses of COVID vaccine -Tdap rx printed and given to patient, advised to get at her pharmacy -lipid panel ordered today for screening purposes -will need to clarify colonoscopy follow up with her GI office  Insomnia Stable. Refill ambien. Uses responsibly. Follow up in 6 months.   Major depressive disorder, recurrent episode ([name]) Doing well off medications. Continue to monitor.  Osteoporosis Cannot take oral bisphosphonates. Patient prefers to wait until after oral surgery to have another reclast injection. Check vitamin D level & BMET today. Follow up 6 months, consider reclast at that  time.  Tinnitus It appears ENT office left patient a message back in March, which she says she never received Provided phone # for ENT office so she can schedule with them.  FOLLOW UP: Follow up in 6 mos for above issues  [place] J. Ardelia Mems, [place]

## 2019-08-15 NOTE — Assessment & Plan Note (Signed)
Cannot take oral bisphosphonates. Patient prefers to wait until after oral surgery to have another reclast injection. Check vitamin D level & BMET today. Follow up 6 months, consider reclast at that time.

## 2019-08-15 NOTE — Assessment & Plan Note (Signed)
Stable. Refill ambien. Uses responsibly. Follow up in 6 months.

## 2019-08-16 LAB — LIPID PANEL
Chol/HDL Ratio: 3.1 ratio (ref 0.0–4.4)
Cholesterol, Total: 200 mg/dL — ABNORMAL HIGH (ref 100–199)
HDL: 64 mg/dL (ref >39)
LDL Chol Calc (NIH): 126 mg/dL — ABNORMAL HIGH (ref 0–99)
Triglycerides: 57 mg/dL (ref 0–149)
VLDL Cholesterol Cal: 10 mg/dL (ref 5–40)

## 2019-08-16 LAB — BASIC METABOLIC PANEL
BUN/Creatinine Ratio: 19 (ref 12–28)
BUN: 15 mg/dL (ref 8–27)
CO2: 24 mmol/L (ref 20–29)
Calcium: 9.7 mg/dL (ref 8.7–10.3)
Chloride: 105 mmol/L (ref 96–106)
Creatinine, Ser: 0.78 mg/dL (ref 0.57–1.00)
GFR calc Af Amer: 92 mL/min/{1.73_m2} (ref >59)
GFR calc non Af Amer: 80 mL/min/{1.73_m2} (ref >59)
Glucose: 80 mg/dL (ref 65–99)
Potassium: 4.6 mmol/L (ref 3.5–5.2)
Sodium: 144 mmol/L (ref 134–144)

## 2019-08-16 LAB — VITAMIN D 25 HYDROXY (VIT D DEFICIENCY, FRACTURES): Vit D, 25-Hydroxy: 11.6 ng/mL — ABNORMAL LOW (ref 30.0–100.0)

## 2019-08-21 ENCOUNTER — Encounter: Payer: Self-pay | Admitting: Family Medicine

## 2019-08-25 DIAGNOSIS — H40019 Open angle with borderline findings, low risk, unspecified eye: Secondary | ICD-10-CM | POA: Diagnosis not present

## 2019-09-29 ENCOUNTER — Encounter: Payer: Self-pay | Admitting: Family Medicine

## 2019-09-29 DIAGNOSIS — Z1211 Encounter for screening for malignant neoplasm of colon: Secondary | ICD-10-CM

## 2019-12-26 ENCOUNTER — Other Ambulatory Visit: Payer: Self-pay

## 2019-12-26 ENCOUNTER — Ambulatory Visit (INDEPENDENT_AMBULATORY_CARE_PROVIDER_SITE_OTHER): Payer: Medicare Other | Admitting: Family Medicine

## 2019-12-26 ENCOUNTER — Encounter: Payer: Self-pay | Admitting: Family Medicine

## 2019-12-26 VITALS — BP 142/80 | HR 69 | Wt 116.2 lb

## 2019-12-26 DIAGNOSIS — M25571 Pain in right ankle and joints of right foot: Secondary | ICD-10-CM

## 2019-12-26 NOTE — Patient Instructions (Signed)
Please return in one week around 10/15 for follow up after your xrays Please go to [facility] imaging when you have free time for xrays of both your ankle and bones in your leg. Please use ice, heat, and take ibuprofen to help with your pain.   Peroneal Tendon Tear  The peroneal tendons are strong bands of tissue that are on the outside of your ankle. They connect a bone in your foot to the muscles that let you turn your feet outward and stand on your toes. A peroneal tendon tear (rupture) can include a partial or complete tear of the tendon. This injury can interfere with your ability to straighten your foot or turn your foot to the outside. What are the causes? This condition may be caused by a long-term injury, or it may occur suddenly. This injury may be caused by:  Doing repeated ankle movements.  Placing sudden stress on the peroneal tendons, which stretches them farther or faster than they are meant to stretch.  Having certain medical problems.  Taking certain medicines for a long time. What increases the risk? The following factors may make you more likely to develop this condition:  Participating in activities that: ? Require sudden and quick tightening (contractions) of the muscles of the lower leg. These include jumping and quick starts. ? Involve kicking with the outer part of the foot, such as martial arts.  Having a weakened tendon from: ? A misshapen groove in the bottom of the heel bone where the peroneal tendons sit. ? Poor foot strength and flexibility. ? A past ankle sprain. ? A past or untreated peroneal tendon injury. ? A medical problem that decreases blood supply to the peroneal tendons.  Failing to warm up properly before activities.  Having shots to treat swelling and inflammation (corticosteroid injections) in or near the peroneal tendon.  Using certain antibiotics. What are the signs or symptoms? Symptoms of this condition include:  A snapping or  popping sound behind the outer part of your ankle. You may also have a feeling that something is torn in your ankle.  Pain when moving your foot up and down.  A clicking feeling when moving your foot up and down.  Inability to stand on the toes or the ball of your foot, or weakness when trying to stand on the toes or the ball of the foot.  Pain, swelling, and bruising around the injured area. How is this diagnosed? This condition may be diagnosed based on:  Your medical history.  Your symptoms.  A physical exam.  Imaging studies. These may include X-rays, an MRI, or an ultrasound. How is this treated? This condition may be treated with:  Applying ice, resting, and taking medicines.  Wearing a splint or a brace to support your ankle and keep it still.  Doing physical therapy to increase your strength and flexibility. Follow these instructions at home: If you have a splint or brace:  Wear it as told by your health care provider. Remove it only as told by your health care provider.  Loosen it f your toes tingle, become numb, or turn cold and blue.  Check the skin around it every day. Tell your health care provider about any concerns.  Keep it clean and dry.  If the splint or brace is not waterproof: ? Do not let it get wet. ? Cover it with a watertight covering when you take a bath or shower. Managing pain, stiffness, and swelling   If directed, put  ice on the injured area. ? If you have a removable splint or brace, remove it as told by your health care provider. ? Put ice in a plastic bag. ? Place a towel between your skin and the bag. ? Leave the ice on for 20 minutes, 2-3 times a day.  Move your toes often to reduce stiffness and swelling.  Raise (elevate) the injured area above the level of your heart while you are sitting or lying down. Activity  Do not use the injured ankle to support your body weight until your health care provider says that you can. Use  crutches as told by your health care provider.  Return to your normal activities as told by your health care provider. Ask your health care provider what activities are safe for you.  Ask your health care provider when it is safe to drive if you have a splint or brace on your ankle. General instructions  Take over-the-counter and prescription medicines only as told by your health care provider.  Do not use any products that contain nicotine or tobacco, such as cigarettes, e-cigarettes, and chewing tobacco. These can delay healing. If you need help quitting, ask your health care provider.  Keep all follow-up visits as told by your health care provider. This is important. How is this prevented?  Warm up and stretch before being active.  Cool down and stretch after being active.  Give your body time to rest between periods of activity.  Maintain physical fitness, including strength and flexibility. Contact a health care provider if:  Your pain gets worse even after treatment.  Your foot or ankle feels numb or cold, or looks blue. Summary  A peroneal tendon tear (rupture) can include a partial or complete tear to the tendons that are on the outside of your ankle.  Symptoms include a sound like a snap or a pop behind the outer part of your ankle, or a feeling that something is torn in your ankle. You may also have pain, swelling, and bruising of your ankle.  This condition is treated with rest, ice, medicines, and surgery.  Contact a health care provider if your pain gets worse, or your ankle feels numb or cold, or looks blue. This information is not intended to replace advice given to you by your health care provider. Make sure you discuss any questions you have with your health care provider. Document Revised: 05/09/2018 Document Reviewed: 05/09/2018 Elsevier Patient Education  [employer].

## 2019-12-26 NOTE — Progress Notes (Signed)
    SUBJECTIVE:   CHIEF COMPLAINT / HPI: right ankle pain   Patient she has had right ankle pain for about two weeks. She states that she was performing ROM exercises when the pain began. She states that the pain was mostly concentrated in her lateral ankle.  She reports that pain has since improved using NSAIDS and rest but still feels tender. She reports that she initially had some swelling but denies any bruising. She denies hearing any popping or clicking at the time of the injury.   PERTINENT  PMH / PSH:  Osteoporosis   OBJECTIVE:   BP (!) 142/80   Pulse 69   Wt 116 lb 3.2 oz (52.7 kg)   SpO2 97%   BMI 19.95 kg/m    General: female appearing stated age in no acute distress Extremities: No peripheral edema. Warm/ well perfused. Right ankle tendernerness along peroneus longues tendon,no edema, no ecchymosis, demonstrates normal ROM, mild tenderness with palpation of achilles, normal strength palpable posterior tibialis and dorsalis pedis  Neuro: sensation intact   ASSESSMENT/PLAN:   Ankle pain, right Right ankle pain with some extension into fibular region. Given history of osteoporosis concern for fracture, also considering tear of peroneus longus tendon with possibility of rupture given prolonged pain.  - ankle and tib/fib xrays  - rest & ice  - NSAIDs PRN     F/u in 1 week after xrays   Eulis Foster, MD [place]

## 2019-12-28 DIAGNOSIS — M25571 Pain in right ankle and joints of right foot: Secondary | ICD-10-CM | POA: Insufficient documentation

## 2019-12-28 NOTE — Assessment & Plan Note (Signed)
Right ankle pain with some extension into fibular region. Given history of osteoporosis concern for fracture, also considering tear of peroneus longus tendon with possibility of rupture given prolonged pain.  - ankle and tib/fib xrays  - rest & ice  - NSAIDs PRN

## 2019-12-29 ENCOUNTER — Other Ambulatory Visit: Payer: Self-pay

## 2019-12-29 ENCOUNTER — Ambulatory Visit
Admission: RE | Admit: 2019-12-29 | Discharge: 2019-12-29 | Disposition: A | Discharge status: Home / Self Care | Payer: Medicare Other | Source: Ambulatory Visit | Attending: Family Medicine | Admitting: Family Medicine

## 2019-12-29 DIAGNOSIS — M25571 Pain in right ankle and joints of right foot: Secondary | ICD-10-CM

## 2019-12-29 DIAGNOSIS — M79661 Pain in right lower leg: Secondary | ICD-10-CM | POA: Diagnosis not present

## 2020-01-01 ENCOUNTER — Other Ambulatory Visit: Payer: Self-pay

## 2020-01-01 ENCOUNTER — Ambulatory Visit (INDEPENDENT_AMBULATORY_CARE_PROVIDER_SITE_OTHER): Payer: Medicare Other | Admitting: Family Medicine

## 2020-01-01 VITALS — BP 128/72 | HR 66 | Wt 117.2 lb

## 2020-01-01 DIAGNOSIS — M25571 Pain in right ankle and joints of right foot: Secondary | ICD-10-CM

## 2020-01-01 NOTE — Assessment & Plan Note (Signed)
Continues to have right lateral ankle pain that is worse after standing for some time.  She is on her feet quite regularly with her job.  Reviewed x-rays personally and radiologist read, no evidence of fracture.  Her joint spaces well-preserved.  Pain is confined over location of calcaneofibular ligament.  Peroneal strength testing does not cause pain for patient, therefore doubt the peroneal tendons are cause of her pain.  Given her recent fall and the significant amount of time she spends on her feet, she would likely benefit from an ASO brace.  Offered to obtain one from sports medicine, but she would like to go to [place] and get one herself.  Advised her to use this when she is going to be up and walking around quite a bit.  Also encouraged doing range of motion exercises by swelling off of it with her feet.  Continue to ice and use Tylenol or ibuprofen for pain as needed.  She is to return in 2 weeks if she does not have any improvement in her pain.

## 2020-01-01 NOTE — Progress Notes (Signed)
    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  PERTINENT  PMH / PSH: GERD, osteoporosis, MDD  OBJECTIVE:   BP 128/72   Pulse 66   Wt 117 lb 3.2 oz (53.2 kg)   SpO2 98%   BMI 20.12 kg/m    Physical Exam:  General: 64 y.o. female in NAD MSK:  Right ankle: - Inspection: No obvious deformity, erythema, or ecchymosis, ulcers, calluses, blisters bilaterally.  Patient does have mildly increased swelling of right anterior lateral ankle compared to left. - Palpation: No TTP at MT heads, no TTP at base of 5th MT, no TTP over cuboid, no tenderness over navicular prominence, no TTP over lateral or medial malleolus bilaterally.  No sign of peroneal tendon subluxation or TTP bilaterally.  TTP specifically over the region of calcaneofibular ligament on right.  No tenderness palpation of left.  No TTP of Achilles tendon bilaterally - Strength: Normal strength with dorsiflexion, plantarflexion, inversion, and eversion of foot; flexion and extension of toes bilaterally.   Reports pain with resisted inversion of right foot. - ROM: Full ROM bilaterally - Neuro/vasc: NV intact bilaterally - Special Tests: Negative anterior drawer, normal talar tilt bilaterally.  Negative syndesmotic compression on right.     ASSESSMENT/PLAN:   Ankle pain, right Continues to have right lateral ankle pain that is worse after standing for some time.  She is on her feet quite regularly with her job.  Reviewed x-rays personally and radiologist read, no evidence of fracture.  Her joint spaces well-preserved.  Pain is confined over location of calcaneofibular ligament.  Peroneal strength testing does not cause pain for patient, therefore doubt the peroneal tendons are cause of her pain.  Given her recent fall and the significant amount of time she spends on her feet, she would likely benefit from an ASO brace.  Offered to obtain one from sports medicine, but she would like to go to [place] and get one herself.  Advised her to use this when she is going to be up and walking around quite a bit.  Also encouraged doing range of motion exercises by swelling off of it with her feet.  Continue to ice and use Tylenol or ibuprofen for pain as needed.  She is to return in 2 weeks if she does not have any improvement in her pain.     Cleophas Dunker, [employer]

## 2020-01-01 NOTE — Patient Instructions (Signed)
Thank you for coming to see me today. It was a pleasure. Today we talked about:   Get an ankle brace from [employer] to use when you are up on your feet a lot and working.  Don't use this more than 2 weeks.  Work on the range of motion exercises by spelling the alphabet with your feet.  Ice your ankle at night.  Please follow-up with me or PCP in 2 weeks if still no improvement.  If you have any questions or concerns, please do not hesitate to call the office at [phone].  Best,   [place] Constable, DO

## 2020-01-15 ENCOUNTER — Ambulatory Visit: Payer: Medicare Other | Admitting: Family Medicine

## 2020-01-18 NOTE — Progress Notes (Signed)
Subjective:   Patient ID: Marie Harvey    DOB: 1956/03/03, 64 y.o. female   MRN: [id number]  Subjective: Marie Harvey is a pleasant 64 y.o. with medical history significant for GERD with esophageal stricture, thyroid nodule, eustacian tube dysfunction, osteoporosis, adjustment disorder, congenital MSK deformity of spine, erythrocytosis, h/o tetrology of fallot with VSD repair in past, MDD, vitamin D def presenting today for medical clearance for job.  Patient presents today for medical clearance for a job at [employer] She notes that she has passed all the necessary health requirements except for the requirement of her running up and down a flight of stairs 6 times. She notes that she walks everywhere and has no difficulty walking up stairs but when it comes to running or jogging she gets very out of breath. She did recently have an right ankle sprain but this is improving. She really wants this job and has been told to come to PCP for "medical clearance".  She notes that she occasionally wakes up from sleeping gasping for air. Unsure if she snores. Denies tobacco use ever.  The patient can walk 4-5 city blocks and or up 2 flights of stairs without difficulty.   Review of systems: Chest pain with exertion? No Dyspnea on exertion? Yes Can you walk 2 blocks without dyspnea or chest pain? Yes Can you walk up a flight of stairs without chest pain or dyspnea? Yes  Medical History: Cardiac conditions? History of open heart surgery due to tetrology of fallot  History of VTE? No Diabetes? No Obesity? No  OSA? No   ROS: Gets SOB with exertion with >2 flights of stairs, occasionally gasps for air while sleeping, unsure if snores Denies chest pain, dyspnea on exertion, chronic cough, history of venous thromboembolism, family history of venous thromboembolism.  Review of Systems:  Per HPI.   Objective:   BP 125/70   Pulse 69   Ht 5\' 4"  (1.626 m)   Wt 118 lb 9.6 oz (53.8  kg)   SpO2 98%   BMI 20.36 kg/m  Vitals and nursing note reviewed.  General: pleasant older female, sitting comfortably in exam chair, well nourished, well developed, in no acute distress with non-toxic appearance Neck: supple, normal ROM CV: regular rate and rhythm, murmur present, no lower extremity edema, 2+ radial and pedal pulses bilaterally Lungs: clear to auscultation bilaterally with normal work of breathing on room air Resp: breathing comfortably on room air, speaking in full sentences Abdomen: soft, non-tender, non-distended Skin: warm, dry Extremities: warm and well perfused MSK: gait normal, see below Neuro: Alert and oriented, speech normal  Root Foot and Ankle: Inspection:  No obvious bony deformity.  No swelling, erythema, or bruising.  Normal arch Palpation: Mild tenderness to palpation along lateral posterior aspect of ankle behind maleolus  ROM: Full  ROM of the ankle. Normal midfoot flexibility Strength: 5/5 strength ankle in all planes Neurovascular: N/V intact distally in the lower extremity Special tests: Negative anterior drawer. Negative squeeze. normal midfoot flexibility. Normal calcaneal motion with heel raise although some mild pain with motion  Left Foot and Ankle: Inspection:  No obvious bony deformity.  No swelling, erythema, or bruising.  Normal arch Palpation: No tenderness to palpation ROM: Full  ROM of the ankle. Normal midfoot flexibility Strength: 5/5 strength ankle in all planes Neurovascular: N/V intact distally in the lower extremity  EKG: HR 63, QT 444. Sinus rhythm with 1st degree AV block. Signs of LA enlargement.   Assessment &  Plan:   Ankle pain, right Symptoms have improved. Ankle exam with only mild tender to palpation and otherwise normal strength and ROM.  - continue conservative management  Encounter for medical assessment Patient presents for medical assessment for job position. She reports ability to walk 5-6 blocks and 2  flights of stairs without difficulty but does develop dyspnea with greater exertion. She does have an extensive cardiac history including cardiac repair for tetralogy of fallot and VSD in the past. Her last echo was in 2013. Her physical exam is otherwise unremarkable except for known murmur. EKG obtained and compared to prior EKG in 2013. Appears stable overall, no ischemic findings, now with signs of LA enlargement. Echo ordered for further evlaution. Ankle exam with well healing right ankle sprain. Normal vitals. I do believe patient has functional ability carry out normal job functions thus letter provided to allow her to be exempt from physical activity (stair climb test) portion. Recommended follow up with PCP at earliest convenience for further evaluation of dyspnea on exertion to rule out reversible causes. Recommend CBC, TSH, CMP, CXR. Consider sleep study if echo stable +/- cardiology referral given her history.   Dyspnea on exertion Plan as above.   Orders Placed This Encounter  Procedures  . EKG 12-Lead  . EKG 12-Lead    Ordered by an unspecified provider   . ECHOCARDIOGRAM COMPLETE    Standing Status:   Future    Standing Expiration Date:   01/18/2021    Order Specific Question:   Where should this test be performed    Answer:   [facility]    Order Specific Question:   Does the patient weigh less than or greater than 250 lbs?    Answer:   Patient weighs less than 250 lbs    Order Specific Question:   Perflutren DEFINITY (image enhancing agent) should be administered unless hypersensitivity or allergy exist    Answer:   Administer Perflutren    Order Specific Question:   Reason for exam-Echo    Answer:   Dyspnea  786.09 / R06.00   No orders of the defined types were placed in this encounter.  Mina Marble, DO PGY-3, [facility] 01/22/2020 9:17 PM

## 2020-01-19 ENCOUNTER — Ambulatory Visit (HOSPITAL_COMMUNITY)
Admission: RE | Admit: 2020-01-19 | Discharge: 2020-01-19 | Disposition: A | Discharge status: Home / Self Care | Payer: Medicare Other | Source: Ambulatory Visit | Attending: Family Medicine | Admitting: Family Medicine

## 2020-01-19 ENCOUNTER — Other Ambulatory Visit: Payer: Self-pay

## 2020-01-19 ENCOUNTER — Ambulatory Visit (INDEPENDENT_AMBULATORY_CARE_PROVIDER_SITE_OTHER): Payer: Medicare Other | Admitting: Family Medicine

## 2020-01-19 VITALS — BP 125/70 | HR 69 | Ht 64.0 in | Wt 118.6 lb

## 2020-01-19 DIAGNOSIS — R0609 Other forms of dyspnea: Secondary | ICD-10-CM | POA: Insufficient documentation

## 2020-01-19 DIAGNOSIS — R0600 Dyspnea, unspecified: Secondary | ICD-10-CM | POA: Diagnosis not present

## 2020-01-19 DIAGNOSIS — Z008 Encounter for other general examination: Secondary | ICD-10-CM | POA: Diagnosis not present

## 2020-01-19 DIAGNOSIS — M25571 Pain in right ankle and joints of right foot: Secondary | ICD-10-CM | POA: Diagnosis not present

## 2020-01-19 NOTE — Patient Instructions (Signed)
It was a pleasure to see you today!  Thank you for choosing [facility] for your primary care.  Marie Harvey was seen for medical clearance.   Our plans for today were: - I have provided a letter on your behalf to allow you to be exempted from the stair climb test - Due to your cardiac history I have ordered an echocardiogram (ultrasound of your heart) and EKG to further evaluate - I do recommend that you follow up with your PCP to further evaluate your shortness of breath  Best Wishes,   Mina Marble, DO

## 2020-01-22 DIAGNOSIS — R0600 Dyspnea, unspecified: Secondary | ICD-10-CM | POA: Insufficient documentation

## 2020-01-22 DIAGNOSIS — R0609 Other forms of dyspnea: Secondary | ICD-10-CM | POA: Insufficient documentation

## 2020-01-22 DIAGNOSIS — Z008 Encounter for other general examination: Secondary | ICD-10-CM | POA: Insufficient documentation

## 2020-01-22 NOTE — Assessment & Plan Note (Signed)
Symptoms have improved. Ankle exam with only mild tender to palpation and otherwise normal strength and ROM.  - continue conservative management

## 2020-01-22 NOTE — Assessment & Plan Note (Signed)
Plan as above.  

## 2020-01-22 NOTE — Assessment & Plan Note (Signed)
Patient presents for medical assessment for job position. She reports ability to walk 5-6 blocks and 2 flights of stairs without difficulty but does develop dyspnea with greater exertion. She does have an extensive cardiac history including cardiac repair for tetralogy of fallot and VSD in the past. Her last echo was in 2013. Her physical exam is otherwise unremarkable except for known murmur. EKG obtained and compared to prior EKG in 2013. Appears stable overall, no ischemic findings, now with signs of LA enlargement. Echo ordered for further evlaution. Ankle exam with well healing right ankle sprain. Normal vitals. I do believe patient has functional ability carry out normal job functions thus letter provided to allow her to be exempt from physical activity (stair climb test) portion. Recommended follow up with PCP at earliest convenience for further evaluation of dyspnea on exertion to rule out reversible causes. Recommend CBC, TSH, CMP, CXR. Consider sleep study if echo stable +/- cardiology referral given her history.

## 2020-02-05 DIAGNOSIS — H40019 Open angle with borderline findings, low risk, unspecified eye: Secondary | ICD-10-CM | POA: Diagnosis not present

## 2020-02-11 ENCOUNTER — Other Ambulatory Visit (HOSPITAL_COMMUNITY): Payer: Medicare Other

## 2020-02-25 ENCOUNTER — Other Ambulatory Visit: Payer: Self-pay | Admitting: Family Medicine

## 2020-03-01 ENCOUNTER — Other Ambulatory Visit: Payer: Self-pay | Admitting: *Deleted

## 2020-03-01 DIAGNOSIS — H40019 Open angle with borderline findings, low risk, unspecified eye: Secondary | ICD-10-CM | POA: Diagnosis not present

## 2020-03-23 ENCOUNTER — Encounter: Payer: Self-pay | Admitting: Family Medicine

## 2020-03-23 ENCOUNTER — Ambulatory Visit (INDEPENDENT_AMBULATORY_CARE_PROVIDER_SITE_OTHER): Payer: Medicare Other | Admitting: Family Medicine

## 2020-03-23 ENCOUNTER — Other Ambulatory Visit: Payer: Self-pay

## 2020-03-23 DIAGNOSIS — G4700 Insomnia, unspecified: Secondary | ICD-10-CM

## 2020-03-23 DIAGNOSIS — R0600 Dyspnea, unspecified: Secondary | ICD-10-CM

## 2020-03-23 DIAGNOSIS — R0609 Other forms of dyspnea: Secondary | ICD-10-CM

## 2020-03-23 MED ORDER — TETANUS-DIPHTH-ACELL PERTUSSIS 5-2.5-18.5 LF-MCG/0.5 IM SUSY: 0.5000 mL | PREFILLED_SYRINGE | Freq: Once | INTRAMUSCULAR | 0 refills | Status: AC

## 2020-03-23 MED ORDER — ZOLPIDEM TARTRATE 5 MG PO TABS: ORAL_TABLET | ORAL | 5 refills | Status: DC

## 2020-03-23 NOTE — Patient Instructions (Addendum)
Go get tetanus shot from your pharmacy  I'll call the GI office and figure out when you need a follow up colonoscopy  Refilled ambien  Reasonable to get echo  Follow up with me in 6 months, sooner if needed  Be well, Dr. Pollie Meyer

## 2020-03-23 NOTE — Progress Notes (Signed)
  Date of Visit: 03/23/2020   SUBJECTIVE:   HPI:  Marie Harvey presents today for routine follow up.  Insomnia - taking ambien 5mg  before bed each night. Has good results with this. Trouble sleeping if she does not take it. Does not get excessively sedated from it.  Colonoscopy follow up - patient recalls being told she needed another colonoscopy in 10 years after her 2015 procedure, however the report says 5 years. Had this done at [facility].  Still needs Tdap vaccine, got flu shot through work  Shortness of breath - was seen here on 11/1 for dyspnea on exertion and was recommended to get echocardiogram. Patient wonders if she really needs this. Does get relatively short of breath when she exerts herself especially while wearing a mask, which she has to do for her job. History of tetralogy of fallot repair, last echo was years ago. No leg swelling.   OBJECTIVE:   BP 117/80   Pulse 81   Ht 5\' 4"  (1.626 m)   Wt 120 lb 9.6 oz (54.7 kg)   SpO2 94%   BMI 20.70 kg/m  Gen: no acute distress, pleasant, cooperaitve HEENT: normocephalic, atraumatic Heart: regular rate and rhythm  Lungs: clear to auscultation bilaterally, normal work of breathing  Neuro: alert, speech normal, grossly nonfocal Ext: No appreciable lower extremity edema bilaterally   ASSESSMENT/PLAN:   Health maintenance:  -given tdap rx -will contact [facility] and clarify recommended colonoscopy follow up interval  Insomnia Stable, tolerating ambien. Benefits outweigh risks. Will continue. Refill x6 months, follow up in 6 months   Dyspnea on exertion Reviewed with patient that echocardiogram is very reasonable given the symptoms she reports. She will call and schedule an appointment.   FOLLOW UP: Follow up in 6 mos for above issues  [facility], MD [facility]  Greater than 30 minutes were spent on this encounter on the day of service, including pre-visit planning,  actual face to face time, coordination of care, and documentation of visit.

## 2020-03-24 ENCOUNTER — Telehealth: Payer: Self-pay | Admitting: Gastroenterology

## 2020-03-24 ENCOUNTER — Encounter: Payer: Self-pay | Admitting: Family Medicine

## 2020-03-24 NOTE — Telephone Encounter (Signed)
Called patient to schedule left voicemail. 

## 2020-03-24 NOTE — Telephone Encounter (Signed)
Hi Dr. Russella Dar,  We received a referral for patient to have a repeat colonoscopy which is over due. Patient had one done in 2015 that is in Epic-Media for review.    Please advise on scheduling.  Thank you

## 2020-03-24 NOTE — Telephone Encounter (Signed)
2015 colonoscopy in Epic was reviewed. Indication: family history of colon polyps. The prep was inadequate. OK for direct colonoscopy with an extended bowel prep.

## 2020-03-24 NOTE — Assessment & Plan Note (Signed)
Reviewed with patient that echocardiogram is very reasonable given the symptoms she reports. She will call and schedule an appointment.

## 2020-03-24 NOTE — Assessment & Plan Note (Signed)
Stable, tolerating ambien. Benefits outweigh risks. Will continue. Refill x6 months, follow up in 6 months

## 2020-03-25 ENCOUNTER — Encounter: Payer: Self-pay | Admitting: Gastroenterology

## 2020-04-21 ENCOUNTER — Other Ambulatory Visit: Payer: Self-pay | Admitting: Family Medicine

## 2020-04-22 NOTE — Telephone Encounter (Signed)
LMOVM informing pt of appt. Will try to reach her again later. Susan Arana Kennon Holter, CMA

## 2020-04-26 ENCOUNTER — Ambulatory Visit (HOSPITAL_COMMUNITY): Payer: Medicare Other

## 2020-04-28 ENCOUNTER — Encounter: Payer: Self-pay | Admitting: Family Medicine

## 2020-05-05 ENCOUNTER — Other Ambulatory Visit: Payer: Self-pay

## 2020-05-05 ENCOUNTER — Ambulatory Visit (HOSPITAL_COMMUNITY)
Admission: RE | Admit: 2020-05-05 | Discharge: 2020-05-05 | Disposition: A | Discharge status: Home / Self Care | Payer: Medicare Other | Source: Ambulatory Visit | Attending: Family Medicine | Admitting: Family Medicine

## 2020-05-05 DIAGNOSIS — R0609 Other forms of dyspnea: Secondary | ICD-10-CM | POA: Insufficient documentation

## 2020-05-05 DIAGNOSIS — Q256 Stenosis of pulmonary artery: Secondary | ICD-10-CM | POA: Insufficient documentation

## 2020-05-05 DIAGNOSIS — R0600 Dyspnea, unspecified: Secondary | ICD-10-CM | POA: Diagnosis not present

## 2020-05-05 DIAGNOSIS — I517 Cardiomegaly: Secondary | ICD-10-CM | POA: Diagnosis not present

## 2020-05-05 DIAGNOSIS — I371 Nonrheumatic pulmonary valve insufficiency: Secondary | ICD-10-CM | POA: Diagnosis not present

## 2020-05-05 LAB — ECHOCARDIOGRAM COMPLETE
Area-P 1/2: 4.06 cm2
P 1/2 time: 495 msec
S' Lateral: 3.4 cm

## 2020-05-05 NOTE — Progress Notes (Signed)
Echocardiogram 2D Echocardiogram has been performed.  Marie Harvey 05/05/2020, 10:31 AM

## 2020-05-07 ENCOUNTER — Telehealth: Payer: Self-pay | Admitting: Family Medicine

## 2020-05-07 DIAGNOSIS — R931 Abnormal findings on diagnostic imaging of heart and coronary circulation: Secondary | ICD-10-CM

## 2020-05-07 MED ORDER — MIRTAZAPINE 7.5 MG PO TABS: 7.5000 mg | ORAL_TABLET | Freq: Every day | ORAL | 1 refills | Status: DC

## 2020-05-07 NOTE — Telephone Encounter (Signed)
Called patient to discuss echo results.  She had already seen the results online on my chart, and had researched what a TEE is.  Echo shows hyperechoic structure at the apex of the right ventricle.  Per the echo report, thrombus is on the differential (though may represent trabeculations) and TEE is recommended.   Recommended we proceed with a TEE to determine whether there is thrombus in her right ventricle.  Patient is concerned about cost, and prefers to see a cardiologist first.  I will place an urgent referral.    She is aware that if there is a thrombus, it could be life-threatening and she may be delaying treatment by waiting to first consult with a cardiologist.  Patient appreciative.  Leeanne Rio, MD

## 2020-05-11 ENCOUNTER — Encounter: Payer: Self-pay | Admitting: Gastroenterology

## 2020-05-11 ENCOUNTER — Ambulatory Visit (INDEPENDENT_AMBULATORY_CARE_PROVIDER_SITE_OTHER): Payer: Medicare Other | Admitting: Gastroenterology

## 2020-05-11 VITALS — BP 120/78 | HR 68 | Ht 64.0 in | Wt 124.4 lb

## 2020-05-11 DIAGNOSIS — K5900 Constipation, unspecified: Secondary | ICD-10-CM | POA: Diagnosis not present

## 2020-05-11 DIAGNOSIS — K921 Melena: Secondary | ICD-10-CM | POA: Diagnosis not present

## 2020-05-11 DIAGNOSIS — Z800 Family history of malignant neoplasm of digestive organs: Secondary | ICD-10-CM | POA: Diagnosis not present

## 2020-05-11 MED ORDER — LINACLOTIDE 145 MCG PO CAPS: 145.0000 ug | ORAL_CAPSULE | Freq: Every day | ORAL | 11 refills | Status: DC

## 2020-05-11 NOTE — Patient Instructions (Signed)
Please start over the counter Miralax mixing 17 grams in 8 oz of water daily. If you see that this regimen is not working then please pick up your prescription for Linzess 145 mcg daily.    It has been recommended to you by your physician that you have a(n) Colonoscopy completed with a 2 day bowel prep. Per your request, we did not schedule the procedure(s) today. Please contact our office at [phone] should you decide to have the procedure completed. You will be scheduled for a pre-visit and procedure at that time.   Thank you for choosing me and [facility].  Pricilla Riffle. Dagoberto Ligas., MD., Marval Regal

## 2020-05-11 NOTE — Progress Notes (Signed)
History of Present Illness: This is a 65 year old female referred by Leeanne Rio, MD for the evaluation of constipation, hematochezia, family history of colon cancer.  She has had long-term problems with constipation and currently has 1 bowel movement per month.  This bowel movement is associated with significant straining and frequent small stools for 2 to 3 days associated with rectal bleeding.  Prior colonoscopy as below within an inadequate bowel prep.  She has not used laxatives on a regular basis.  She is not uncomfortable or bloated in between bowel movements. Her mother was diagnosed with colon cancer about age 75.  Denies weight loss, abdominal pain, diarrhea, change in stool caliber, melena, nausea, vomiting, dysphagia, reflux symptoms, chest pain.  Colonoscopy 03/2013 Harrell Lark, MD for family history of colon polyps. An inadequate bowel prep, no abnormalities listed. A 5 year interval for repeat colonoscopy was recommended.    Allergies  Allergen Reactions  . Codeine     REACTION: Nausea, not a true allergy  . Calcium-Containing Compounds Nausea And Vomiting  . Penicillins Rash and Other (See Comments)    Lip Numbness  . Vitamin D Analogs Nausea And Vomiting   Outpatient Medications Prior to Visit  Medication Sig Dispense Refill  . Cholecalciferol (D3 VITAMIN PO) Take 5,000 Units by mouth in the morning and at bedtime.    . fluticasone (FLONASE) 50 MCG/ACT nasal spray Place 2 sprays into both nostrils daily. (Patient taking differently: Place 2 sprays into both nostrils daily as needed.) 16 g 6  . ibuprofen (ADVIL,MOTRIN) 200 MG tablet Take 400 mg by mouth.    . latanoprost (XALATAN) 0.005 % ophthalmic solution PLACE 1 DROP INTO BOTH EYES NIGHTLY.  6  . mirtazapine (REMERON) 7.5 MG tablet Take 1 tablet (7.5 mg total) by mouth at bedtime. 30 tablet 1  . zolpidem (AMBIEN) 5 MG tablet TAKE 1 TABLET BY MOUTH ONCE DAILY AT BEDTIME AS NEEDED FOR SLEEP 30 tablet 5   No  facility-administered medications prior to visit.   Past Medical History:  Diagnosis Date  . Abnormal RBC    h/o. f/u by PCP  . Endometrial hyperplasia without atypia, complex 02/2000  . GERD (gastroesophageal reflux disease)   . H/O cleft lip    Palate  . H/O major depression   . H/O tetralogy of Fallot repair 1973  . Heart murmur    As a child  . Osteoporosis    Past Surgical History:  Procedure Laterality Date  . ABDOMINAL SURGERY    . CLEFT LIP REPAIR     and palate as child  . HERNIA REPAIR    . TETRALOGY OF FALLOT REPAIR  1973   Social History   Socioeconomic History  . Marital status: Single    Spouse name: Not on file  . Number of children: Not on file  . Years of education: Not on file  . Highest education level: Not on file  Occupational History  . Not on file  Tobacco Use  . Smoking status: Never Smoker  . Smokeless tobacco: Never Used  Vaping Use  . Vaping Use: Never used  Substance and Sexual Activity  . Alcohol use: No  . Drug use: No  . Sexual activity: Never    Birth control/protection: None  Other Topics Concern  . Not on file  Social History Narrative  . Not on file   Social Determinants of Health   Financial Resource Strain: Not on file  Food Insecurity: Not  on file  Transportation Needs: Not on file  Physical Activity: Not on file  Stress: Not on file  Social Connections: Not on file   Family History  Problem Relation Age of Onset  . Uterine cancer Mother   . Colon cancer Mother   . Stroke Father   . Breast cancer Maternal Aunt 25      Review of Systems: Pertinent positive and negative review of systems were noted in the above HPI section. All other review of systems were otherwise negative.   Physical Exam: General: Well developed, well nourished, no acute distress Head: Normocephalic and atraumatic Eyes: Sclerae anicteric, EOMI Ears: Normal auditory acuity Mouth: Not examined, mask on during Covid-19 pandemic Neck:  Supple, no masses or thyromegaly Lungs: Clear throughout to auscultation Heart: Regular rate and rhythm; no murmurs, rubs or bruits Abdomen: Soft, non tender and non distended. No masses, hepatosplenomegaly or hernias noted. Normal Bowel sounds Rectal: No lesions, no tenderness, hard brown, heme neg stool in the vault Musculoskeletal: Symmetrical with no gross deformities  Skin: No lesions on visible extremities Pulses:  Normal pulses noted Extremities: No clubbing, cyanosis, edema or deformities noted Neurological: Alert oriented x 4, grossly nonfocal Cervical Nodes:  No significant cervical adenopathy Inguinal Nodes: No significant inguinal adenopathy Psychological:  Alert and cooperative. Normal mood and affect   Assessment and Recommendations:  1.  Chronic constipation, hematochezia, FHx of colon cancer in her mother at age 74.  Rule out colorectal neoplasms, hemorrhoids and other disorders.  Begin MiraLAX 1-2 times daily and after 1-2 weeks if not producing complete bowel movements at least every 3 days recommend changing to Linzess 145 mcg daily.  She is advised that 2-3 bowel movements per week is needed prior to starting the 2-day colonoscopy bowel prep for the prep to be successful.  She requests medications for nausea with bowel prep regimen. Zofran 4 mg po prior to prep doses.  Recommended colonoscopy in the next few weeks however she wants to schedule in June or July. The risks (including bleeding, perforation, infection, missed lesions, medication reactions and possible hospitalization or surgery if complications occur), benefits, and alternatives to colonoscopy with possible biopsy and possible polypectomy were discussed with the patient and they consent to proceed.    cc: Leeanne Rio, MD [address] [place],  [place] [address]

## 2020-05-23 NOTE — Progress Notes (Addendum)
Cardiology Office Note:    Date:  05/26/2020   ID:  Marie Harvey, DOB May 14, 1955, MRN [id number]  PCP:  Leeanne Rio, MD   [place]  Cardiologist:  No primary care provider on file.  Advanced Practice Provider:  No care team member to display Electrophysiologist:  None   Referring MD: Leeanne Rio, MD     History of Present Illness:    Marie Harvey is a 65 y.o. female with a hx of tetrology of fallot s/p repair 1973, GERD and depression who was referred by Dr. Ardelia Mems for further evaluation abnormal TTE as detailed below.  Patient underwent repair in 1973 for tetralogy of fallot at [facility]. Was followed with congenital specialist for several years but hasn't seen a Cardiologist in 53 years. Overall, she states she has been doing well. No chest pain, SOB, palpitations, lightheadedness, dizziness, DOE, orthopnea or PND. She walks without exertional SOB or chest pain, but admits that she does not regularly exercise. She is on no cardiac medications.  TTE with LVEF 45-50%, possible small residual VSD, moderately enlarged RV with preserved RV function, mild PS with mean gradient 62mmHg, peak gradient 73mmHg. Severely enlarged LA and RA, and hyperechoic structure seen at the apex of RV.   Past Medical History:  Diagnosis Date  . Abnormal RBC    h/o. f/u by PCP  . Endometrial hyperplasia without atypia, complex 02/2000  . GERD (gastroesophageal reflux disease)   . H/O cleft lip    Palate  . H/O major depression   . H/O tetralogy of Fallot repair 1973  . Heart murmur    As a child  . Osteoporosis     Past Surgical History:  Procedure Laterality Date  . ABDOMINAL SURGERY    . CLEFT LIP REPAIR     and palate as child  . HERNIA REPAIR    . TETRALOGY OF FALLOT REPAIR  1973    Current Medications: Current Meds  Medication Sig  . Cholecalciferol (D3 VITAMIN PO) Take 5,000 Units by mouth in the morning and at bedtime.  .  fluticasone (FLONASE) 50 MCG/ACT nasal spray Place 2 sprays into both nostrils daily.  Marland Kitchen ibuprofen (ADVIL,MOTRIN) 200 MG tablet Take 400 mg by mouth.  . latanoprost (XALATAN) 0.005 % ophthalmic solution PLACE 1 DROP INTO BOTH EYES NIGHTLY.  Marland Kitchen linaclotide (LINZESS) 145 MCG CAPS capsule Take 1 capsule (145 mcg total) by mouth daily before breakfast.  . mirtazapine (REMERON) 7.5 MG tablet Take 1 tablet (7.5 mg total) by mouth at bedtime.  Marland Kitchen zolpidem (AMBIEN) 5 MG tablet TAKE 1 TABLET BY MOUTH ONCE DAILY AT BEDTIME AS NEEDED FOR SLEEP     Allergies:   Codeine, Calcium-containing compounds, Penicillins, and Vitamin d analogs   Social History   Socioeconomic History  . Marital status: Single    Spouse name: Not on file  . Number of children: Not on file  . Years of education: Not on file  . Highest education level: Not on file  Occupational History  . Not on file  Tobacco Use  . Smoking status: Never Smoker  . Smokeless tobacco: Never Used  Vaping Use  . Vaping Use: Never used  Substance and Sexual Activity  . Alcohol use: No  . Drug use: No  . Sexual activity: Never    Birth control/protection: None  Other Topics Concern  . Not on file  Social History Narrative  . Not on file   Social Determinants of  Health   Financial Resource Strain: Not on file  Food Insecurity: Not on file  Transportation Needs: Not on file  Physical Activity: Not on file  Stress: Not on file  Social Connections: Not on file     Family History: The patient's family history includes Breast cancer (age of onset: 38) in her maternal aunt; Colon cancer in her mother; Stroke in her father; Uterine cancer in her mother.  ROS:   Please see the history of present illness.    Review of Systems  Constitutional: Negative for chills and fever.  HENT: Negative for hearing loss and sore throat.   Eyes: Negative for blurred vision, double vision and redness.  Respiratory: Negative for shortness of breath.    Cardiovascular: Negative for chest pain, palpitations, orthopnea, claudication, leg swelling and PND.  Gastrointestinal: Positive for heartburn. Negative for blood in stool, melena, nausea and vomiting.  Genitourinary: Negative for dysuria and flank pain.  Musculoskeletal: Negative for falls and myalgias.  Neurological: Negative for dizziness and loss of consciousness.  Endo/Heme/Allergies: Negative for polydipsia.  Psychiatric/Behavioral: Negative for substance abuse.    EKGs/Labs/Other Studies Reviewed:    The following studies were reviewed today: TTE May 09, 2020: 1. Patient is s/p repair of Tetrology of Fallot.  2. The patient is s/p VSD patch repair. Suspect a small residual,  restrictive VSD in the the membranous poriton of the septum with L-->R  shunting (best seen on clip 15).  3. The pulmonic valve appears thickened with at least moderately  decreased leaflet excursion. There is mild pulmonic stenosis with mean  gradient 72mmHg, peak gradient 39mmHg. There is severe pulmonic valve  regurgitation.  4. The right ventricular size is moderately enlarged with preserved RV  systolic function. There is normal pulmonary artery systolic pressure.  5. There is a hyperechoic structure seen at the apex of the RV. May  represent trabeculations, however, thrombus is on the differential.  Fotunately, the RV systolic funciton appears preserved. Recommend definity  contrast vs TEE for further evaluation.  6. Left ventricular ejection fraction, by estimation, is 45 to 50%. The  left ventricle has mildly decreased function. The left ventricle  demonstrates global hypokinesis.  7. The interventricular septum is flattened in diastole ('D' shaped left  ventricle), consistent with right ventricular volume overload.  8. Left atrial size was severely dilated.  9. Right atrial size was severely dilated.  10. The mitral valve is grossly normal. Trivial mitral valve  regurgitation.  11. The  tricuspid valve is mildly thickened. There is mild tricuspid  regurgitation  12. The aortic valve is tricuspid. There is mild calcification of the  aortic valve. There is mild thickening of the aortic valve. Aortic valve  regurgitation is mild. Mild aortic valve sclerosis is present, with no  evidence of aortic valve stenosis.  13. Aortic dilatation noted. There is mild dilatation of the ascending  aorta, measuring 36 mm.  14. The inferior vena cava is normal in size with greater than 50%  respiratory variabilty, suggesting right atrial pressure of 3 mmHg.  15. Consider TEE for further evaluation of hyperechoic structure seen in  RV apex and further evaluation of the pulmonic valve.   Comparison(s): Compared to prior echo report in 2013, the pulmonic valve  regurgitation now appears severe with moderate dilation of the RV. There  is evidence of RA/RV pressure overload. The LVEF now appears to be around  50%. The patch repaired VSD is  visualized. There is a small, residual membranous VSD suspected. Rest  of  the changes in detailed report as above.   EKG:  EKG is  ordered today.  The ekg ordered today demonstrates NSR, LVH, with HR 72  Recent Labs: 08/15/2019: BUN 15; Creatinine, Ser 0.78; Potassium 4.6; Sodium 144  Recent Lipid Panel    Component Value Date/Time   CHOL 200 (H) 08/15/2019 1107   TRIG 57 08/15/2019 1107   HDL 64 08/15/2019 1107   CHOLHDL 3.1 08/15/2019 1107   CHOLHDL 2.6 05/25/2014 0843   VLDL 13 05/25/2014 0843   LDLCALC 126 (H) 08/15/2019 1107     Physical Exam:    VS:  BP 140/70 (BP Location: Left Arm, Patient Position: Sitting, Cuff Size: Normal)   Pulse 72   Ht 5\' 4"  (1.626 m)   Wt 127 lb (57.6 kg)   SpO2 98%   BMI 21.80 kg/m     Wt Readings from Last 3 Encounters:  05/26/20 127 lb (57.6 kg)  05/11/20 124 lb 6.4 oz (56.4 kg)  03/23/20 120 lb 9.6 oz (54.7 kg)     GEN:  Well nourished, well developed in no acute distress HEENT: Normal NECK: No  JVD; No carotid bruits LYMPHATICS: No lymphadenopathy CARDIAC: RRR, 2/6 systolic murmur. No rubs, gallops RESPIRATORY:  Clear to auscultation without rales, wheezing or rhonchi  ABDOMEN: Soft, non-tender, non-distended MUSCULOSKELETAL:  No edema; No deformity  SKIN: Warm and dry NEUROLOGIC:  Alert and oriented x 3 PSYCHIATRIC:  Normal affect   ASSESSMENT:    1. Tetralogy of Fallot   2. Congenital heart defect   3. Primary hypertension   4. Pure hypercholesterolemia    PLAN:    In order of problems listed above:  #Tetrology of Fallot s/p Repair: #Moderate RV enlargement: #Hyperechoic Structure in RV: TTE with LVEF 45-50%, possible small residual VSD, moderately enlarged RV with preserved RV function, mild PS with mean gradient 11mmHg, peak gradient 23mmHg. Severely enlarged LA and RA, and hyperechoic structure seen at the apex of RV. Patient is asymptomatic with no chest pain, SOB, lightheadedness, palpitations, fatigue, HF symptoms, or decreased exercise capacity. -Check cardiac MRI -Obtain records from [facility] -Will refer to congenital specialist  #HTN: Eleavted today. -Will montior on home blood pressure cuff and keep log with goal <120/80s -Counseled about low Na diet  #HLD: LDL 126.Patient wants to do trial of diet and lifestyle modifications first. Will follow-up in 3 months -Diet and lifestyle as below -Check lipid panel at follow-up in 6 weeks     Medication Adjustments/Labs and Tests Ordered: Current medicines are reviewed at length with the patient today.  Concerns regarding medicines are outlined above.  Orders Placed This Encounter  Procedures  . Ambulatory referral to Cardiology  . EKG 12-Lead   No orders of the defined types were placed in this encounter.   Patient Instructions  Medication Instructions:  Your physician recommends that you continue on your current medications as directed. Please refer to the Current Medication list given to you  today.  *If you need a refill on your cardiac medications before your next appointment, please call your pharmacy*   Lab Work: None ordered   Testing/Procedures: None ordered   Follow-Up: At [facility], you and your health needs are our priority.  As part of our continuing mission to provide you with exceptional heart care, we have created designated Provider Care Teams.  These Care Teams include your primary Cardiologist (physician) and Advanced Practice Providers (APPs -  Physician Assistants and Nurse Practitioners) who all work together to  provide you with the care you need, when you need it.  You have been referred to [facility]    Your next appointment with HeartCare:   3 month(s)  The format for your next appointment:   In Person  Provider:   Gwyndolyn Kaufman, MD    Thank you for choosing [facility]!!    [phone]   Other Instructions  Keep track of your blood pressure at home.  Please call the office if numbers average greater than 130/80        Signed, Freada Bergeron, MD  05/26/2020 9:31 AM    [place]

## 2020-05-26 ENCOUNTER — Ambulatory Visit: Payer: Medicare Other | Admitting: Cardiology

## 2020-05-26 ENCOUNTER — Telehealth: Payer: Self-pay | Admitting: Cardiology

## 2020-05-26 ENCOUNTER — Other Ambulatory Visit: Payer: Self-pay

## 2020-05-26 ENCOUNTER — Encounter: Payer: Self-pay | Admitting: Cardiology

## 2020-05-26 VITALS — BP 140/70 | HR 72 | Ht 64.0 in | Wt 127.0 lb

## 2020-05-26 DIAGNOSIS — Q249 Congenital malformation of heart, unspecified: Secondary | ICD-10-CM | POA: Diagnosis not present

## 2020-05-26 DIAGNOSIS — I10 Essential (primary) hypertension: Secondary | ICD-10-CM | POA: Diagnosis not present

## 2020-05-26 DIAGNOSIS — E7800 Pure hypercholesterolemia, unspecified: Secondary | ICD-10-CM

## 2020-05-26 DIAGNOSIS — Q213 Tetralogy of Fallot: Secondary | ICD-10-CM | POA: Diagnosis not present

## 2020-05-26 NOTE — Patient Instructions (Addendum)
Medication Instructions:  Your physician recommends that you continue on your current medications as directed. Please refer to the Current Medication list given to you today.  *If you need a refill on your cardiac medications before your next appointment, please call your pharmacy*   Lab Work: None ordered   Testing/Procedures: None ordered   Follow-Up: At [facility], you and your health needs are our priority.  As part of our continuing mission to provide you with exceptional heart care, we have created designated Provider Care Teams.  These Care Teams include your primary Cardiologist (physician) and Advanced Practice Providers (APPs -  Physician Assistants and Nurse Practitioners) who all work together to provide you with the care you need, when you need it.  You have been referred to [facility]    Your next appointment with [facility]Care:   3 month(s)  The format for your next appointment:   In Person  Provider:   Gwyndolyn Kaufman, MD    Thank you for choosing [facility]!!    [phone]   Other Instructions  Keep track of your blood pressure at home.  Please call the office if numbers average greater than 130/80

## 2020-05-26 NOTE — Telephone Encounter (Signed)
Left message for patient to call and discuss scheduling the Cardiac MRI ordered by Dr. Johney Frame

## 2020-05-26 NOTE — Addendum Note (Signed)
Addended by: Gwyndolyn Kaufman on: 05/26/2020 11:17 AM   Modules accepted: Orders

## 2020-05-28 ENCOUNTER — Encounter: Payer: Self-pay | Admitting: Cardiology

## 2020-05-28 NOTE — Telephone Encounter (Signed)
Left message for patient regarding Thursday 07/08/20 12:00pm Cardiac MRI appointment at [facility]---arrival time is 11:30am 1st floor admissions office---will mail information to patient and it is available in My Chart.  Requested patient call with questions or concerns.

## 2020-06-10 DIAGNOSIS — Q213 Tetralogy of Fallot: Secondary | ICD-10-CM | POA: Diagnosis not present

## 2020-06-10 DIAGNOSIS — I499 Cardiac arrhythmia, unspecified: Secondary | ICD-10-CM | POA: Diagnosis not present

## 2020-06-10 DIAGNOSIS — I371 Nonrheumatic pulmonary valve insufficiency: Secondary | ICD-10-CM | POA: Diagnosis not present

## 2020-06-10 DIAGNOSIS — Z8774 Personal history of (corrected) congenital malformations of heart and circulatory system: Secondary | ICD-10-CM | POA: Diagnosis not present

## 2020-07-06 ENCOUNTER — Telehealth (HOSPITAL_COMMUNITY): Payer: Self-pay | Admitting: *Deleted

## 2020-07-06 NOTE — Telephone Encounter (Signed)
Attempted to call patient regarding upcoming cardiac MRI appointment. Left message on voicemail with name and callback number  Martena Emanuele RN Navigator Cardiac Imaging [facility] [phone] Office [phone] Cell  

## 2020-07-08 ENCOUNTER — Ambulatory Visit (HOSPITAL_COMMUNITY)
Admission: RE | Admit: 2020-07-08 | Discharge: 2020-07-08 | Disposition: A | Discharge status: Home / Self Care | Payer: Medicare Other | Source: Ambulatory Visit | Attending: Cardiology | Admitting: Cardiology

## 2020-07-08 ENCOUNTER — Other Ambulatory Visit: Payer: Self-pay

## 2020-07-08 DIAGNOSIS — Q213 Tetralogy of Fallot: Secondary | ICD-10-CM | POA: Diagnosis not present

## 2020-07-08 DIAGNOSIS — Q249 Congenital malformation of heart, unspecified: Secondary | ICD-10-CM | POA: Diagnosis not present

## 2020-07-08 MED ORDER — GADOBUTROL 1 MMOL/ML IV SOLN
10.0000 mL | Freq: Once | INTRAVENOUS | Status: AC | PRN
  Administered 2020-07-08: 10 mL via INTRAVENOUS

## 2020-08-12 NOTE — Progress Notes (Signed)
Cardiology Office Note:    Date:  08/13/2020   ID:  [name], DOB [birth year], MRN [id number]  PCP:  Leeanne Rio, MD   [place]  Cardiologist:  None  Advanced Practice Provider:  No care team member to display Electrophysiologist:  None   Referring MD: Leeanne Rio, MD     History of Present Illness:    Lyndel Dancel is a 65 y.o. female with a hx of tetrology of fallot s/p repair 1973, GERD and depression who presents to clinic for follow-up of her TOF.  Patient underwent repair in 1973 for tetralogy of fallot at [facility]. Was followed with congenital specialist for several years but had not seen a Cardiologist in 54 years. TTE 05/05/20 with LVEF 45-50%, possible small residual VSD, moderately enlarged RV with preserved RV function, d-shaped septum mild PS with mean gradient 71mmHg, peak gradient 63mmHg, severe PR; mild TR. Severely enlarged LA and RA, and hyperechoic structure seen at the apex of RV. Mildly dilated ascending aorta 21mm.  She subsequently underwent cardiac MR on 07/11/20 which demonstrated mild-to-moderate AR, severe PR, moderate PS, ASD, perimembranous VSD, Qp:Qs 1.37, LVEF 50%, mild RV enlargement with preserved RV systolic function, no RV thrombus, moderate RAE, mild ascending aortia dilation.  Saw Dr. Raul Del on 06/10/20 where a Holter monitor was placed for palpitations. Planned for continued monitoring of her PR given relative lack of symptoms.  Today, the patient feels well. She completed the holter monitor and has sent it back to Dr. Raul Del (no read in the system yet). No palpitations. No lightheadedness, dizzy, chest pain, nausea or vomiting. Has dyspnea on exertion but no shortness of breath at rest. Feels like her exercise capacity is better than last visit. Blood pressure remains elevated at home based on extensive log. Runs 110-150s. Mainly 140s.   Past Medical History:  Diagnosis Date  .  Abnormal RBC    h/o. f/u by PCP  . Endometrial hyperplasia without atypia, complex 02/2000  . GERD (gastroesophageal reflux disease)   . H/O cleft lip    Palate  . H/O major depression   . H/O tetralogy of Fallot repair 1973  . Heart murmur    As a child  . Osteoporosis     Past Surgical History:  Procedure Laterality Date  . ABDOMINAL SURGERY    . CLEFT LIP REPAIR     and palate as child  . HERNIA REPAIR    . TETRALOGY OF FALLOT REPAIR  1973    Current Medications: Current Meds  Medication Sig  . Cholecalciferol (D3 VITAMIN PO) Take 5,000 Units by mouth in the morning and at bedtime.  . fluticasone (FLONASE) 50 MCG/ACT nasal spray Place 2 sprays into both nostrils daily.  Marland Kitchen ibuprofen (ADVIL,MOTRIN) 200 MG tablet Take 400 mg by mouth.  . latanoprost (XALATAN) 0.005 % ophthalmic solution PLACE 1 DROP INTO BOTH EYES NIGHTLY.  . valsartan (DIOVAN) 40 MG tablet Take 1 tablet (40 mg total) by mouth daily.  Marland Kitchen zolpidem (AMBIEN) 5 MG tablet TAKE 1 TABLET BY MOUTH ONCE DAILY AT BEDTIME AS NEEDED FOR SLEEP     Allergies:   Codeine, Calcium-containing compounds, Penicillins, and Vitamin d analogs   Social History   Socioeconomic History  . Marital status: Single    Spouse name: Not on file  . Number of children: Not on file  . Years of education: Not on file  . Highest education level: Not on file  Occupational History  .  Not on file  Tobacco Use  . Smoking status: Never Smoker  . Smokeless tobacco: Never Used  Vaping Use  . Vaping Use: Never used  Substance and Sexual Activity  . Alcohol use: No  . Drug use: No  . Sexual activity: Never    Birth control/protection: None  Other Topics Concern  . Not on file  Social History Narrative  . Not on file   Social Determinants of Health   Financial Resource Strain: Not on file  Food Insecurity: Not on file  Transportation Needs: Not on file  Physical Activity: Not on file  Stress: Not on file  Social Connections: Not  on file     Family History: The patient's family history includes Breast cancer (age of onset: 39) in her maternal aunt; Colon cancer in her mother; Stroke in her father; Uterine cancer in her mother.  ROS:   Please see the history of present illness.    Review of Systems  Constitutional: Negative for chills and fever.  HENT: Negative for hearing loss and sore throat.   Eyes: Negative for blurred vision, double vision and redness.  Respiratory: Negative for shortness of breath.   Cardiovascular: Negative for chest pain, palpitations, orthopnea, claudication, leg swelling and PND.  Gastrointestinal: Positive for heartburn. Negative for blood in stool, melena, nausea and vomiting.  Genitourinary: Negative for dysuria and flank pain.  Musculoskeletal: Negative for falls and myalgias.  Neurological: Negative for dizziness and loss of consciousness.  Endo/Heme/Allergies: Negative for polydipsia.  Psychiatric/Behavioral: Negative for substance abuse.    EKGs/Labs/Other Studies Reviewed:    The following studies were reviewed today: TTE 2020/05/06: 1. Patient is s/p repair of Tetrology of Fallot.  2. The patient is s/p VSD patch repair. Suspect a small residual,  restrictive VSD in the the membranous poriton of the septum with L-->R  shunting (best seen on clip 15).  3. The pulmonic valve appears thickened with at least moderately  decreased leaflet excursion. There is mild pulmonic stenosis with mean  gradient 66mmHg, peak gradient 25mmHg. There is severe pulmonic valve  regurgitation.  4. The right ventricular size is moderately enlarged with preserved RV  systolic function. There is normal pulmonary artery systolic pressure.  5. There is a hyperechoic structure seen at the apex of the RV. May  represent trabeculations, however, thrombus is on the differential.  Fotunately, the RV systolic funciton appears preserved. Recommend definity  contrast vs TEE for further evaluation.  6.  Left ventricular ejection fraction, by estimation, is 45 to 50%. The  left ventricle has mildly decreased function. The left ventricle  demonstrates global hypokinesis.  7. The interventricular septum is flattened in diastole ('D' shaped left  ventricle), consistent with right ventricular volume overload.  8. Left atrial size was severely dilated.  9. Right atrial size was severely dilated.  10. The mitral valve is grossly normal. Trivial mitral valve  regurgitation.  11. The tricuspid valve is mildly thickened. There is mild tricuspid  regurgitation  12. The aortic valve is tricuspid. There is mild calcification of the  aortic valve. There is mild thickening of the aortic valve. Aortic valve  regurgitation is mild. Mild aortic valve sclerosis is present, with no  evidence of aortic valve stenosis.  13. Aortic dilatation noted. There is mild dilatation of the ascending  aorta, measuring 36 mm.  14. The inferior vena cava is normal in size with greater than 50%  respiratory variabilty, suggesting right atrial pressure of 3 mmHg.  15. Consider  TEE for further evaluation of hyperechoic structure seen in  RV apex and further evaluation of the pulmonic valve.   Comparison(s): Compared to prior echo report in 2013, the pulmonic valve  regurgitation now appears severe with moderate dilation of the RV. There  is evidence of RA/RV pressure overload. The LVEF now appears to be around  50%. The patch repaired VSD is  visualized. There is a small, residual membranous VSD suspected. Rest of  the changes in detailed report as above.   Cardiac MRI 07/08/20: FINDINGS: 1. Small left ventricular size, with LVEDD 50 mm, but LVEDVI 44.39 ml/m2.  Normal left ventricular thickness, with intraventricular septal thickness of 8 mm and posterior wall thickness of 7 mm.  Mild left ventricular systolic dysfunction (LVEF =50%). There are no regional wall motion abnormalities but global  hypokinesis.  There is no late gadolinium enhancement in the left ventricular myocardium.  2. Mild right ventricular enlargement with RVEDVI 104 mL/m2.  RVEDV: * : 168.20 ml  RVESV: * : 84.05 ml  RVSV: * : 84.15 ml  RVEF: * : 50.03 %  RVCO: * : 5103.33 ml/min  RVCI: * : 3.16 l/min/m  HR: * : 60.6/min  Normal right ventricular thickness.  Normal right ventricular systolic function (RVEF =60%). There are no regional wall motion abnormalities or aneurysms.  There is no evidence of RV thrombus.  3. Normal left atrial size and moderate right atrial enlargement, with LAESV 37 mL/m2 and RAESV 53 mL/m2.  4. Normal size of the aortic root and main, left, and right pulmonary arteries. Ascending aorta measures 38 mm; mild dilation for age and BSA.  5.  Abnormalities as below:  Tricuspid Valve: Qualitatively trivial regurgitation  Mitral Valve: Qualitatively trivial regurgitation  Aortic Valve: Mild-to-moderate aortic regurgitation, regurgitation fraction 35%  Pulmonic Valve: Severe pulmonic regurgitation, regurgitation fraction 60%, valve thickening noted, maximum velocity 3.29 m/s consistent with moderate pulmonic stenosis  Atrial septal defects: There is a left atrial to right atrial communication with velocity decoding from left to right consistent with an ASD. This is best seen on the four chamber stack series.  VSD: There is evidence of an perimembranous VSD with velocity decoding consistent with residual left to right flow.  Qp/Qs: 1.37  6.  Normal pericardium.  Trivial pericardial effusion.  7. Grossly, no extracardiac findings; evidence of prior sternotomy noted. Recommended dedicated study if concerned for non-cardiac pathology.  8.  Breathhold artifact noted.  IMPRESSION: 1. Small left ventricular size.  2. Mild left ventricular systolic dysfunction (LVEF =50%).  3. Mild right ventricular enlargement with RVEDVI  104 mL/m2.  4. Normal right ventricular systolic function (RVEF =10%).  5. There is no evidence of RV thrombus.  6. Moderate right atrial enlargement  7. Ascending aorta measures 38 mm; mild dilation for age and BSA.  8. Mild-to-moderate aortic regurgitation.  9. Severe pulmonic regurgitation with regurgitation fraction 60% and valve thickening noted. Moderate pulmonic stenosis  10. Atrial septal defect incidentally noted.  11.  Perimembranous VSD patch with residual flow  12. Trivial pericardial effusion.   EKG:  EKG not performed today  Recent Labs: 08/15/2019: BUN 15; Creatinine, Ser 0.78; Potassium 4.6; Sodium 144  Recent Lipid Panel    Component Value Date/Time   CHOL 200 (H) 08/15/2019 1107   TRIG 57 08/15/2019 1107   HDL 64 08/15/2019 1107   CHOLHDL 3.1 08/15/2019 1107   CHOLHDL 2.6 05/25/2014 0843   VLDL 13 05/25/2014 0843   LDLCALC 126 (H) 08/15/2019 1107  Physical Exam:    VS:  BP (!) 158/88   Pulse (!) 58   Ht 5\' 4"  (1.626 m)   Wt 123 lb (55.8 kg)   SpO2 97%   BMI 21.11 kg/m     Wt Readings from Last 3 Encounters:  08/13/20 123 lb (55.8 kg)  05/26/20 127 lb (57.6 kg)  05/11/20 124 lb 6.4 oz (56.4 kg)     GEN:  Well nourished, well developed in no acute distress HEENT: Normal NECK: No JVD; No carotid bruits LYMPHATICS: No lymphadenopathy CARDIAC: RRR, 2/6 systolic murmur and diastolic murmur. No rubs, gallops RESPIRATORY:  Clear to auscultation without rales, wheezing or rhonchi  ABDOMEN: Soft, non-tender, non-distended MUSCULOSKELETAL:  No edema; No deformity  SKIN: Warm and dry NEUROLOGIC:  Alert and oriented x 3 PSYCHIATRIC:  Normal affect   ASSESSMENT:    1. Tetralogy of Fallot   2. Lipid screening   3. Congenital heart defect   4. Primary hypertension   5. Pure hypercholesterolemia    PLAN:    In order of problems listed above:  #Tetrology of Fallot s/p Repair: #Moderate RV enlargement: #Severe PR, moderate  PS Cardiac MRI with LVEF 50%, mild RV enlargement with preserved systolic function, severe PR, moderate PS, ASD, small VSD, mild-to-moderate AI. Has been seen by Dr. Raul Del who recommended continued surveillance of PR as the patient is relatively asymptomatic with no significant HF symptoms or exercise limitations.  -Followed by Dr. Raul Del -Continued surveillance of PR/PS given relative lack of symptoms  #Palpitations: High risk of arrhythmias due to history of TOF repair and RV enlargement. Holter placed by Dr. Raul Del. -Follow-up with Dr. Raul Del -Will follow-up monitor  #HTN: Blood pressure averaging mainly 140 at home.  -Start valsartan 40mg  daily -BMET next week -Counseled about low Na diet  #HLD: LDL 126.Patient wants to do trial of diet and lifestyle modifications first. Will follow-up in 3 months -Diet and lifestyle as below -Check lipid panel next week     Medication Adjustments/Labs and Tests Ordered: Current medicines are reviewed at length with the patient today.  Concerns regarding medicines are outlined above.  Orders Placed This Encounter  Procedures  . Lipid panel  . Basic metabolic panel   Meds ordered this encounter  Medications  . valsartan (DIOVAN) 40 MG tablet    Sig: Take 1 tablet (40 mg total) by mouth daily.    Dispense:  90 tablet    Refill:  3    Patient Instructions  Medication Instructions:  START volsartan 40 mg daily *If you need a refill on your cardiac medications before your next appointment, please call your pharmacy*   Lab Work: BMET and LIPID panel Aug 17, 2020 (you must fast before the blood work) If you have labs (blood work) drawn today and your tests are completely normal, you will receive your results only by: Marland Kitchen MyChart Message (if you have MyChart) OR . A paper copy in the mail If you have any lab test that is abnormal or we need to change your treatment, we will call you to review the  results.   Testing/Procedures:    Follow-Up: At [facility], you and your health needs are our priority.  As part of our continuing mission to provide you with exceptional heart care, we have created designated Provider Care Teams.  These Care Teams include your primary Cardiologist (physician) and Advanced Practice Providers (APPs -  Physician Assistants and Nurse Practitioners) who all work together to provide you with the  care you need, when you need it.  We recommend signing up for the patient portal called "MyChart".  Sign up information is provided on this After Visit Summary.  MyChart is used to connect with patients for Virtual Visits (Telemedicine).  Patients are able to view lab/test results, encounter notes, upcoming appointments, etc.  Non-urgent messages can be sent to your provider as well.   To learn more about what you can do with MyChart, go to [email or web address].    Your next appointment:   6 month(s)  The format for your next appointment:   In Person  Provider:   You may see Dr. Johney Frame or one of the following Advanced Practice Providers on your designated Care Team:    Richardson Dopp, PA-C  Robbie Lis, [place]    Other Instructions      Signed, Freada Bergeron, MD  08/13/2020 1:07 PM    [place]

## 2020-08-13 ENCOUNTER — Encounter: Payer: Self-pay | Admitting: Cardiology

## 2020-08-13 ENCOUNTER — Ambulatory Visit: Payer: Medicare Other | Admitting: Cardiology

## 2020-08-13 ENCOUNTER — Other Ambulatory Visit: Payer: Self-pay

## 2020-08-13 VITALS — BP 158/88 | HR 58 | Ht 64.0 in | Wt 123.0 lb

## 2020-08-13 DIAGNOSIS — Q249 Congenital malformation of heart, unspecified: Secondary | ICD-10-CM

## 2020-08-13 DIAGNOSIS — Q213 Tetralogy of Fallot: Secondary | ICD-10-CM

## 2020-08-13 DIAGNOSIS — Z13220 Encounter for screening for lipoid disorders: Secondary | ICD-10-CM | POA: Diagnosis not present

## 2020-08-13 DIAGNOSIS — E7800 Pure hypercholesterolemia, unspecified: Secondary | ICD-10-CM

## 2020-08-13 DIAGNOSIS — I10 Essential (primary) hypertension: Secondary | ICD-10-CM | POA: Diagnosis not present

## 2020-08-13 MED ORDER — VALSARTAN 40 MG PO TABS: 40.0000 mg | ORAL_TABLET | Freq: Every day | ORAL | 3 refills | Status: DC

## 2020-08-13 NOTE — Patient Instructions (Signed)
Medication Instructions:  START volsartan 40 mg daily *If you need a refill on your cardiac medications before your next appointment, please call your pharmacy*   Lab Work: BMET and LIPID panel Aug 17, 2020 (you must fast before the blood work) If you have labs (blood work) drawn today and your tests are completely normal, you will receive your results only by: Marland Kitchen MyChart Message (if you have MyChart) OR . A paper copy in the mail If you have any lab test that is abnormal or we need to change your treatment, we will call you to review the results.   Testing/Procedures:    Follow-Up: At [facility] you and your health needs are our priority.  As part of our continuing mission to provide you with exceptional heart care, we have created designated Provider Care Teams.  These Care Teams include your primary Cardiologist (physician) and Advanced Practice Providers (APPs -  Physician Assistants and Nurse Practitioners) who all work together to provide you with the care you need, when you need it.  We recommend signing up for the patient portal called "MyChart".  Sign up information is provided on this After Visit Summary.  MyChart is used to connect with patients for Virtual Visits (Telemedicine).  Patients are able to view lab/test results, encounter notes, upcoming appointments, etc.  Non-urgent messages can be sent to your provider as well.   To learn more about what you can do with MyChart, go to [email or web address].    Your next appointment:   6 month(s)  The format for your next appointment:   In Person  Provider:   You may see Dr. Johney Frame or one of the following Advanced Practice Providers on your designated Care Team:    Richardson Dopp, PA-C  Robbie Lis, [place]    Other Instructions

## 2020-08-16 ENCOUNTER — Encounter: Payer: Self-pay | Admitting: Family Medicine

## 2020-08-17 ENCOUNTER — Telehealth: Payer: Self-pay | Admitting: Family Medicine

## 2020-08-17 ENCOUNTER — Other Ambulatory Visit: Payer: Medicare Other | Admitting: *Deleted

## 2020-08-17 ENCOUNTER — Other Ambulatory Visit: Payer: Self-pay

## 2020-08-17 DIAGNOSIS — Q249 Congenital malformation of heart, unspecified: Secondary | ICD-10-CM | POA: Diagnosis not present

## 2020-08-17 DIAGNOSIS — Z13220 Encounter for screening for lipoid disorders: Secondary | ICD-10-CM | POA: Diagnosis not present

## 2020-08-17 LAB — LIPID PANEL
Chol/HDL Ratio: 3.1 ratio (ref 0.0–4.4)
Cholesterol, Total: 197 mg/dL (ref 100–199)
HDL: 63 mg/dL (ref >39)
LDL Chol Calc (NIH): 121 mg/dL — ABNORMAL HIGH (ref 0–99)
Triglycerides: 71 mg/dL (ref 0–149)
VLDL Cholesterol Cal: 13 mg/dL (ref 5–40)

## 2020-08-17 LAB — BASIC METABOLIC PANEL
BUN/Creatinine Ratio: 13 (ref 12–28)
BUN: 10 mg/dL (ref 8–27)
CO2: 26 mmol/L (ref 20–29)
Calcium: 9.6 mg/dL (ref 8.7–10.3)
Chloride: 105 mmol/L (ref 96–106)
Creatinine, Ser: 0.75 mg/dL (ref 0.57–1.00)
Glucose: 84 mg/dL (ref 65–99)
Potassium: 4.4 mmol/L (ref 3.5–5.2)
Sodium: 142 mmol/L (ref 134–144)
eGFR: 88 mL/min/{1.73_m2} (ref >59)

## 2020-08-17 NOTE — Telephone Encounter (Signed)
Resident medical Information form dropped off for at front desk for completion.  Verified that patient section of form has been completed.  Last DOS/WCC with PCP was 03/23/20.  Placed form in team folder to be completed by clinical staff.  Creig Hines

## 2020-08-18 NOTE — Telephone Encounter (Signed)
Reviewed form and placed in PCP's box for completion.  .Robertson Colclough R Katherleen Folkes, CMA  

## 2020-08-19 ENCOUNTER — Telehealth: Payer: Self-pay

## 2020-08-19 NOTE — Telephone Encounter (Signed)
LMTCB

## 2020-08-19 NOTE — Telephone Encounter (Signed)
-----   Message from Freada Bergeron, MD sent at 08/17/2020  9:41 PM EDT ----- Her kidney function and electrolytes look great. Her LDL is still a little elevated. If okay with her, I would recommend we start a low dose statin (crestor 5mg  daily) to lower her risk of future cardiovascular events.

## 2020-08-23 MED ORDER — ROSUVASTATIN CALCIUM 5 MG PO TABS: 5.0000 mg | ORAL_TABLET | Freq: Every day | ORAL | 1 refills | Status: DC

## 2020-08-23 NOTE — Telephone Encounter (Signed)
Pt made aware of lab results and recommendations per Dr. Johney Frame.  Pt agreed to start low dose crestor 5 mg po daily. Confirmed the pharmacy of choice with the pt.  Pt verbalized understanding and agrees with this plan.

## 2020-08-23 NOTE — Telephone Encounter (Signed)
-----   Message from Freada Bergeron, MD sent at 08/17/2020  9:41 PM EDT ----- Her kidney function and electrolytes look great. Her LDL is still a little elevated. If okay with her, I would recommend we start a low dose statin (crestor 5mg  daily) to lower her risk of future cardiovascular events.

## 2020-08-27 NOTE — Telephone Encounter (Signed)
Form completed, will scan copy into chart and mail original to [facility] as requested by patient.  Leeanne Rio, MD

## 2020-09-13 ENCOUNTER — Other Ambulatory Visit: Payer: Self-pay | Admitting: Family Medicine

## 2020-09-13 DIAGNOSIS — Z1231 Encounter for screening mammogram for malignant neoplasm of breast: Secondary | ICD-10-CM

## 2020-10-09 ENCOUNTER — Encounter: Payer: Self-pay | Admitting: Family Medicine

## 2020-10-10 ENCOUNTER — Other Ambulatory Visit: Payer: Self-pay | Admitting: Family Medicine

## 2020-10-14 ENCOUNTER — Encounter: Payer: Self-pay | Admitting: Family Medicine

## 2020-10-14 ENCOUNTER — Ambulatory Visit (INDEPENDENT_AMBULATORY_CARE_PROVIDER_SITE_OTHER): Payer: Medicare Other | Admitting: Family Medicine

## 2020-10-14 ENCOUNTER — Other Ambulatory Visit: Payer: Self-pay

## 2020-10-14 DIAGNOSIS — H9193 Unspecified hearing loss, bilateral: Secondary | ICD-10-CM | POA: Diagnosis not present

## 2020-10-14 DIAGNOSIS — M7061 Trochanteric bursitis, right hip: Secondary | ICD-10-CM | POA: Diagnosis not present

## 2020-10-14 DIAGNOSIS — J309 Allergic rhinitis, unspecified: Secondary | ICD-10-CM | POA: Diagnosis not present

## 2020-10-14 DIAGNOSIS — G4700 Insomnia, unspecified: Secondary | ICD-10-CM

## 2020-10-14 MED ORDER — PREVNAR 20 0.5 ML IM SUSY: 0.5000 mL | PREFILLED_SYRINGE | Freq: Once | INTRAMUSCULAR | 0 refills | Status: AC

## 2020-10-14 MED ORDER — SHINGRIX 50 MCG/0.5ML IM SUSR: 0.5000 mL | Freq: Once | INTRAMUSCULAR | 1 refills | Status: AC

## 2020-10-14 NOTE — Patient Instructions (Signed)
It was great to see you again today!  Get debrox and try to clean out your ears  Let me know if hearing issues don't improve  Get shingrix and prevnar 20 at your pharmacy  See handout below about your hip  Refilled flonase  Follow up in 6 months, sooner if needed  Be well, Dr. Ardelia Mems   Hip Bursitis  Hip bursitis is the inflammation of one or more bursae in the hip joint. Bursae are small fluid-filled sacs that absorb shock and prevent bones from rubbingagainst each other. Hip bursitis can cause mild to moderate pain, and symptoms often come and goover time. What are the causes? This condition results from increased friction between the hip bones and the tendons around the hip joint. This condition can happen if you: Overuse your hip muscles. Injure your hip. Have weak buttocks muscles. Have bone spurs. Have an infection. In some cases, the cause may not be known. What increases the risk? You are more likely to develop this condition if: You injured your hip previously or had hip surgery. You have a medical condition, such as arthritis, gout, diabetes, or thyroid disease. You have spine problems. You have one leg that is shorter than the other. You participate in athletic activities that include repetitive motion, like running. You participate in sports where there is a risk of injury or falling, such as football, martial arts, or skiing. What are the signs or symptoms? Symptoms may come and go, and they often include: Pain in the hip or groin area. Pain may get worse with movement. Tenderness and swelling of the hip. In rare cases, the bursa may become infected. If this happens, you may get afever, as well as warmth and redness in the hip area. How is this diagnosed? This condition may be diagnosed based on: Your symptoms. Your medical history. A physical exam. Imaging tests, such as: X-rays to check your bones. MRI or ultrasound to check your tendons and  muscles. Bone scan. A biopsy to remove fluid from your inflamed bursa for testing. How is this treated? This condition is treated by resting, icing, applying pressure (compression), and raising (elevating) the injured area. This is called RICE treatment. In some cases, RICE treatment may not be enough to make your symptoms go away. Treatment may also include: Taking medicine to help with swelling and pain. Using crutches, a cane, or a walker to decrease the strain on your hip. Getting a shot of cortisone medicine to help reduce swelling. Taking other medicines if the bursa is infected. Draining fluid out of the bursa to help relieve swelling. Having surgery to remove a damaged or infected bursa. This is rare. Long-term treatment may include: Physical therapy exercises for strength and flexibility. Lifestyle changes, such as weight loss, to reduce the strain on the hip. Follow these instructions at home: Managing pain, stiffness, and swelling     If directed, put ice on the painful area. Put ice in a plastic bag. Place a towel between your skin and the bag. Leave the ice on for 20 minutes, 2-3 times a day. Raise (elevate) your hip as much as you can without pain. To do this, put a pillow under your hips while you lie down. If directed, apply heat to the affected area as often as told by your health care provider. Use the heat source that your health care provider recommends, such as a moist heat pack or a heating pad. Place a towel between your skin and the heat  source. Leave the heat on for 20-30 minutes. Remove the heat if your skin turns bright red. This is especially important if you are unable to feel pain, heat, or cold. You may have a greater risk of getting burned. Activity Do not use your hip to support your body weight until your health care provider says that you can. Use crutches, a cane, or a walker as told by your health care provider. If the affected leg is one that you  use to drive, ask your health care provider if it is safe to drive. Rest and protect your hip as much as possible until your pain and swelling get better. Return to your normal activities as told by your health care provider. Ask your health care provider what activities are safe for you. Do exercises as told by your health care provider. General instructions Take over-the-counter and prescription medicines only as told by your health care provider. Gently massage and stretch your injured area as often as is comfortable. Wear compression wraps only as told by your health care provider. If one of your legs is shorter than the other, get fitted for a shoe insert or orthotic. Maintain a healthy weight. Follow instructions from your health care provider for weight control. These may include dietary restrictions. Keep all follow-up visits as told by your health care provider. This is important. How is this prevented? Exercise regularly, as told by your health care provider. Wear supportive footwear that is appropriate for your sport. Warm up and stretch before being active. Cool down and stretch after being active. Take breaks regularly from repetitive activity. If an activity irritates your hip or causes pain, avoid the activity as much as possible. Avoid sitting down for long periods at a time. Where to find more information American Academy [employer]geons: orthoinfo.aaos.org Contact a health care provider if: You have a fever. You develop new symptoms. You have trouble walking or doing everyday activities. You have pain that gets worse or does not get better with medicine. You develop red skin or a feeling of warmth in your hip area. Get help right away if: You cannot move your hip. You have severe pain. You cannot control the muscles in your feet. Summary Hip bursitis is the inflammation of one or more bursae in the hip joint. Bursae are small fluid-filled sacs that absorb  shock and prevent bones from rubbing against each other. Hip bursitis can cause hip or groin pain, and symptoms often come and go over time. This condition is often treated by resting, icing, applying pressure (compression), and raising (elevating) the injured area. Other treatments may be needed. This information is not intended to replace advice given to you by your health care provider. Make sure you discuss any questions you have with your healthcare provider. Document Revised: 01/06/2019 Document Reviewed: 11/12/2017 Elsevier Patient Education  2022 [employer].

## 2020-10-14 NOTE — Progress Notes (Signed)
  Date of Visit: 10/14/2020   SUBJECTIVE:   HPI:  Marie "[name]" presents today for routine follow up.  Insomnia - taking ambien '5mg'$  every night. On rare occasions she takes '10mg'$  if she truly can't sleep. Denies sleepwalking, other issues with the medication. It overall helps a lot.  R hip pain - having pain over lateral aspect of R hip. She sleeps on her side and notices this makes the pain worse. Radiates around the greater trochanter, sometimes to the buttock, sometimes further down the thigh. Aching feeling, not tingling/burning feeling. Ibuprofen helps. Denies weakness in legs.  Hearing issues - having L sided crackly sounds in L ear since she came back from [place] on a trip on June 10. Also having R sided hum for years. She is hearing impaired and wears hearing aides.  Allergies - needs refill of flonase. It helps with her allergies.  OBJECTIVE:   BP 120/70   Pulse 79   Ht '5\' 5"'$  (1.651 m)   Wt 126 lb 3.2 oz (57.2 kg)   SpO2 93%   BMI 21.00 kg/m  Gen: no acute distress, pleasant, cooperative, well appearing HEENT: tympanic membranes obscured bilaterally by cerumen impaction Lungs: normal work of breathing  Neuro: alert, speech at baseline, grossly nonfocal Ext: tenderness to palpation over R greater trochanter. Gait normal.  ASSESSMENT/PLAN:   Health maintenance:  -current on Tdap & COVID vaccines -encouraged colonoscopy, plans to work on scheduling this for some time after the new year -given rx for shingrix, also for Prevnar 20 (she prefers to not get this today as she has to go to work)  Insomnia Stable, continue ambien '5mg'$  nightly  Allergic rhinitis Refill flonase  Trochanteric bursitis of right hip Exam consistent with R greater trochanteric bursitis Discussed tx options including steroid injection, patient wants to avoid this for now Handout given with care/exercise instructions  Hearing impairment Exam shows impacted cerumen bilaterally Recommend  debrox drops to clear out wax If still having hearing issues at that time would follow up with her audiologist (already uses hearing aides)  FOLLOW UP: Follow up in 6 mos for above issues  [place] J. Ardelia Mems, [place]

## 2020-10-18 DIAGNOSIS — H40019 Open angle with borderline findings, low risk, unspecified eye: Secondary | ICD-10-CM | POA: Diagnosis not present

## 2020-10-19 DIAGNOSIS — IMO0001 Reserved for inherently not codable concepts without codable children: Secondary | ICD-10-CM | POA: Insufficient documentation

## 2020-10-19 DIAGNOSIS — M7061 Trochanteric bursitis, right hip: Secondary | ICD-10-CM | POA: Insufficient documentation

## 2020-10-19 DIAGNOSIS — H9190 Unspecified hearing loss, unspecified ear: Secondary | ICD-10-CM | POA: Insufficient documentation

## 2020-10-19 NOTE — Assessment & Plan Note (Signed)
Refill flonase

## 2020-10-19 NOTE — Assessment & Plan Note (Signed)
Exam consistent with R greater trochanteric bursitis Discussed tx options including steroid injection, patient wants to avoid this for now Handout given with care/exercise instructions

## 2020-10-19 NOTE — Assessment & Plan Note (Signed)
Exam shows impacted cerumen bilaterally Recommend debrox drops to clear out wax If still having hearing issues at that time would follow up with her audiologist (already uses hearing aides)

## 2020-10-19 NOTE — Assessment & Plan Note (Signed)
Stable, continue ambien '5mg'$  nightly

## 2020-10-25 ENCOUNTER — Telehealth: Payer: Self-pay

## 2020-10-25 NOTE — Telephone Encounter (Signed)
LVM to have pt call back to schedule AWV.   RE: confirm insurance and schedule AWV on my schedule if times are convenient for patient or other AWV schedule as template permits.   

## 2020-11-08 ENCOUNTER — Other Ambulatory Visit: Payer: Self-pay | Admitting: Family Medicine

## 2020-11-08 ENCOUNTER — Ambulatory Visit
Admission: RE | Admit: 2020-11-08 | Discharge: 2020-11-08 | Disposition: A | Discharge status: Home / Self Care | Payer: Medicare Other | Source: Ambulatory Visit | Attending: Family Medicine | Admitting: Family Medicine

## 2020-11-08 ENCOUNTER — Other Ambulatory Visit: Payer: Self-pay

## 2020-11-08 DIAGNOSIS — Z1231 Encounter for screening mammogram for malignant neoplasm of breast: Secondary | ICD-10-CM

## 2020-11-23 NOTE — Telephone Encounter (Signed)
Made 2nd attempt to schedule pt for AWV.   Closing note.  

## 2021-01-17 ENCOUNTER — Telehealth: Payer: Self-pay | Admitting: Family Medicine

## 2021-01-17 NOTE — Telephone Encounter (Signed)
Called patient to schedule AWV, if patient calls back please assist in scheduling, it will be with Page, any questions please see me. Thanks!

## 2021-02-05 ENCOUNTER — Other Ambulatory Visit: Payer: Self-pay

## 2021-02-05 ENCOUNTER — Encounter (HOSPITAL_COMMUNITY): Payer: Self-pay

## 2021-02-05 ENCOUNTER — Ambulatory Visit (HOSPITAL_COMMUNITY)
Admission: EM | Admit: 2021-02-05 | Discharge: 2021-02-05 | Disposition: A | Discharge status: Home / Self Care | Payer: Medicare Other | Attending: Family Medicine | Admitting: Family Medicine

## 2021-02-05 DIAGNOSIS — R112 Nausea with vomiting, unspecified: Secondary | ICD-10-CM

## 2021-02-05 DIAGNOSIS — J069 Acute upper respiratory infection, unspecified: Secondary | ICD-10-CM

## 2021-02-05 MED ORDER — OSELTAMIVIR PHOSPHATE 75 MG PO CAPS: 75.0000 mg | ORAL_CAPSULE | Freq: Two times a day (BID) | ORAL | 0 refills | Status: DC

## 2021-02-05 NOTE — ED Triage Notes (Signed)
Pt presents with c/o vomiting and sore throat that started last night.

## 2021-02-05 NOTE — ED Provider Notes (Signed)
[place]    CSN: [id number] Arrival date & time: 02/05/21  1145      History   Chief Complaint Chief Complaint  Patient presents with   Vomiting    HPI Marie Harvey is a 65 y.o. female.   Presenting today with 1 day history of fatigue, body aches, malaise, nausea vomiting, sore throat, cough.  Denies chest pain, shortness of breath, fever, abdominal pain, diarrhea.  So far not trying anything over-the-counter for symptoms.  States recent sick contacts with influenza.  No known chronic pulmonary disease.   Past Medical History:  Diagnosis Date   Abnormal RBC    h/o. f/u by PCP   Endometrial hyperplasia without atypia, complex 02/2000   GERD (gastroesophageal reflux disease)    H/O cleft lip    Palate   H/O major depression    H/O tetralogy of Fallot repair 1973   Heart murmur    As a child   Osteoporosis     Patient Active Problem List   Diagnosis Date Noted   Trochanteric bursitis of right hip 10/19/2020   Hearing impairment 10/19/2020   Dyspnea on exertion 01/22/2020   Ankle pain, right 12/28/2019   Tinnitus 11/01/2015   Thyroid nodule 08/09/2015   Pelvic mass in female 05/03/2015   Lymphadenopathy 10/02/2014   History of polycythemia 04/24/2014   Adjustment disorder with disturbance of emotion 12/29/2013   Loss of weight 12/20/2012   Hematochezia 12/20/2012   Unspecified vitamin D deficiency 05/01/2012   Coccygeal pain 04/30/2012   Esophageal stricture 12/20/2011   Lumbago 12/19/2011   Osteoporosis 12/19/2011   Erythrocytosis 04/21/2011   Insomnia 04/05/2011   Lipid screening 04/04/2011   GERD (gastroesophageal reflux disease) 04/04/2011   EUSTACHIAN TUBE DYSFUNCTION, RIGHT 07/02/2009   Allergic rhinitis 04/30/2009   ESSENTIAL AND OTHER SPECIFIED FORMS OF TREMOR 02/25/2008   CONGENITAL MUSCULOSKELETAL DEFORMITY OF SPINE 11/07/2007   History of tetralogy of Fallot repair 07/26/2006   Sinusitis, chronic 05/24/2006   Major  depressive disorder, recurrent episode ([place]) 05/17/2006   DYSPHAGIA 05/17/2006    Past Surgical History:  Procedure Laterality Date   ABDOMINAL SURGERY     CLEFT LIP REPAIR     and palate as child   [place]    OB History   No obstetric history on file.      Home Medications    Prior to Admission medications   Medication Sig Start Date End Date Taking? Authorizing Provider  oseltamivir (TAMIFLU) 75 MG capsule Take 1 capsule (75 mg total) by mouth every 12 (twelve) hours. 02/05/21  Yes Volney American, PA-C  Cholecalciferol (D3 VITAMIN PO) Take 5,000 Units by mouth in the morning and at bedtime.    [provider]  fluticasone (FLONASE) 50 MCG/ACT nasal spray Place 2 sprays into both nostrils daily. 11/14/13   Willeen Niece, MD  ibuprofen (ADVIL,MOTRIN) 200 MG tablet Take 400 mg by mouth.    [provider]  latanoprost (XALATAN) 0.005 % ophthalmic solution PLACE 1 DROP INTO BOTH EYES NIGHTLY. 06/09/15   [provider]  linaclotide (LINZESS) 145 MCG CAPS capsule Take 1 capsule (145 mcg total) by mouth daily before breakfast. 05/11/20   Ladene Artist, MD  rosuvastatin (CRESTOR) 5 MG tablet Take 1 tablet (5 mg total) by mouth daily. 08/23/20   Freada Bergeron, MD  valsartan (DIOVAN) 40 MG tablet Take 1 tablet (40 mg total) by mouth daily.  08/13/20   Freada Bergeron, MD  zolpidem (AMBIEN) 5 MG tablet TAKE 1 TABLET BY MOUTH ONCE DAILY AT BEDTIME AS NEEDED FOR SLEEP 11/09/20   Leeanne Rio, MD    Family History Family History  Problem Relation Age of Onset   Uterine cancer Mother    Colon cancer Mother    Stroke Father    Breast cancer Maternal Aunt 65    Social History Social History   Tobacco Use   Smoking status: Never   Smokeless tobacco: Never  Vaping Use   Vaping Use: Never used  Substance Use Topics   Alcohol use: No   Drug use: No     Allergies   Codeine,  Calcium-containing compounds, Penicillins, and Vitamin d analogs   Review of Systems Review of Systems Per HPI  Physical Exam Triage Vital Signs ED Triage Vitals  Enc Vitals Group     BP 02/05/21 1322 (!) 149/77     Pulse Rate 02/05/21 1322 73     Resp 02/05/21 1322 17     Temp 02/05/21 1322 98.3 F (36.8 C)     Temp Source 02/05/21 1322 Oral     SpO2 02/05/21 1322 97 %     Weight --      Height --      Head Circumference --      Peak Flow --      Pain Score 02/05/21 1327 0     Pain Loc --      Pain Edu? --      Excl. in [place]? --    No data found.  Updated Vital Signs BP (!) 149/77   Pulse 73   Temp 98.3 F (36.8 C) (Oral)   Resp 17   SpO2 97%   Visual Acuity Right Eye Distance:   Left Eye Distance:   Bilateral Distance:    Right Eye Near:   Left Eye Near:    Bilateral Near:     Physical Exam Vitals and nursing note reviewed.  Constitutional:      Appearance: Normal appearance.  HENT:     Head: Atraumatic.     Right Ear: Tympanic membrane and external ear normal.     Left Ear: Tympanic membrane and external ear normal.     Nose: Rhinorrhea present.     Mouth/Throat:     Mouth: Mucous membranes are moist.     Pharynx: Posterior oropharyngeal erythema present.  Eyes:     Extraocular Movements: Extraocular movements intact.     Conjunctiva/sclera: Conjunctivae normal.  Cardiovascular:     Rate and Rhythm: Normal rate and regular rhythm.     Heart sounds: Normal heart sounds.  Pulmonary:     Effort: Pulmonary effort is normal.     Breath sounds: Normal breath sounds. No wheezing or rales.  Abdominal:     General: Bowel sounds are normal. There is no distension.     Palpations: Abdomen is soft.     Tenderness: There is no abdominal tenderness. There is no guarding.  Musculoskeletal:        General: Normal range of motion.     Cervical back: Normal range of motion and neck supple.  Skin:    General: Skin is warm and dry.  Neurological:     Mental  Status: She is alert and oriented to person, place, and time.  Psychiatric:        Mood and Affect: Mood normal.        Thought  Content: Thought content normal.     UC Treatments / Results  Labs (all labs ordered are listed, but only abnormal results are displayed) Labs Reviewed - No data to display  EKG   Radiology No results found.  Procedures Procedures (including critical care time)  Medications Ordered in UC Medications - No data to display  Initial Impression / Assessment and Plan / UC Course  I have reviewed the triage vital signs and the nursing notes.  Pertinent labs & imaging results that were available during my care of the patient were reviewed by me and considered in my medical decision making (see chart for details).     Vitals and exam reassuring, suspect influenza given symptoms and community exposures.  Will start Tamiflu proactively, she is agreeable to foregoing viral testing today and will quarantine and treat based on symptoms for flu.  Discussed supportive over-the-counter medications and home care.  Return for acutely worsening symptoms.  Work note given.  Final Clinical Impressions(s) / UC Diagnoses   Final diagnoses:  Viral URI with cough  Nausea and vomiting, unspecified vomiting type   Discharge Instructions   None    ED Prescriptions     Medication Sig Dispense Auth. Provider   oseltamivir (TAMIFLU) 75 MG capsule Take 1 capsule (75 mg total) by mouth every 12 (twelve) hours. 10 capsule Volney American, [place]      PDMP not reviewed this encounter.   Volney American, [place] 02/05/21 1410

## 2021-02-15 ENCOUNTER — Other Ambulatory Visit: Payer: Self-pay

## 2021-02-15 ENCOUNTER — Ambulatory Visit (INDEPENDENT_AMBULATORY_CARE_PROVIDER_SITE_OTHER): Payer: Medicare Other | Admitting: Family Medicine

## 2021-02-15 DIAGNOSIS — G4700 Insomnia, unspecified: Secondary | ICD-10-CM | POA: Diagnosis not present

## 2021-02-15 DIAGNOSIS — H9193 Unspecified hearing loss, bilateral: Secondary | ICD-10-CM

## 2021-02-15 MED ORDER — ZOLPIDEM TARTRATE 5 MG PO TABS: ORAL_TABLET | ORAL | 5 refills | Status: DC

## 2021-02-15 NOTE — Assessment & Plan Note (Signed)
Patient's symptoms have persisted since last visit and clearance of cerumen. Recommended following up with audiology/ENT to discuss further treatment.

## 2021-02-15 NOTE — Assessment & Plan Note (Addendum)
Patient's symptoms are well controlled on ambien and melatonin. Given no side effects of excessive drowsiness or sleep cycle disturbances, will continue current regimen. Refill of ambien sent. Follow up in 6 months.

## 2021-02-15 NOTE — Patient Instructions (Signed)
It was great to see you again today!  Refilled your medications  Schedule an annual wellness visit with Page This is a longer visit to focus on your wellness goals and how to keep you healthy. It is free.   Schedule colonoscopy Get second shingles vaccine  Call ENT/audiology for appointment  Follow up with me in 6 months, sooner if needed  Be well, Dr. Ardelia Mems

## 2021-02-15 NOTE — Progress Notes (Signed)
    SUBJECTIVE:   CHIEF COMPLAINT / HPI:   Marie Harvey (MRN: [id number]) is a 65 y.o. female with a history of allergic rhinitis, GERD, MDD, and insomnia who presents for a check up.  Insomnia  Patient states her insomnia is controlled. She takes melatonin and ambien every night to help her sleep. She has been using this regimen without excessive drowsiness or sleep disturbances.  Hearing issues Patient states that her left ear continues to crackle since her last visit and that her right ear has been humming since 2011. She saw Dr. Ernesto Rutherford about 2-3 years ago and was told her symptoms were tinnitus and there was no cure. She has not seen an audiologist for this issue but is amenable to seeing audiology at the first of 2023.   OBJECTIVE:   BP 110/70   Pulse (!) 110   Ht 5\' 5"  (1.651 m)   Wt 128 lb 6.4 oz (58.2 kg)   SpO2 98%   BMI 21.37 kg/m    PHYSICAL EXAM  GEN: Well-developed, in NAD HEAD: NCAT CVS: RRR, normal S1/S2, no murmurs, rubs, gallops, no LE edema RESP: Breathing comfortably on RA, no retractions, wheezes, rhonchi, or crackles ABD: Non-distended PSY: Normal mood and affect, alert and oriented SKIN: No obvious lesions or rashes  EXT: Moves all extremities grossly equally  ASSESSMENT/PLAN:   Insomnia Patient's symptoms are well controlled on ambien and melatonin. Given no side effects of excessive drowsiness or sleep cycle disturbances, will continue current regimen. Refill of ambien sent. Follow up in 6 months.  Hearing impairment Patient's symptoms have persisted since last visit and clearance of cerumen. Recommended following up with audiology/ENT to discuss further treatment.   Health maintenance Patient will schedule her colonoscopy with [facility] in January 2023. She will also take her prescription for Shingrix to her pharmacy for the vaccine at the beginning of the year. She received her PCV20 vaccine in September 2022 at [facility]. Advised to  schedule AWV  [place]  Patient seen along with [employer] student [facility]. I personally evaluated this patient along with the student, and verified all aspects of the history, physical exam, and medical decision making as documented by the student. I agree with the student's documentation and have made all necessary edits.  [name], MD  [place]

## 2021-04-25 DIAGNOSIS — H40019 Open angle with borderline findings, low risk, unspecified eye: Secondary | ICD-10-CM | POA: Diagnosis not present

## 2021-05-25 ENCOUNTER — Encounter: Payer: Self-pay | Admitting: Family Medicine

## 2021-05-25 DIAGNOSIS — H9193 Unspecified hearing loss, bilateral: Secondary | ICD-10-CM

## 2021-05-25 MED ORDER — ZOLPIDEM TARTRATE 5 MG PO TABS: ORAL_TABLET | ORAL | 0 refills | Status: DC

## 2021-05-25 NOTE — Addendum Note (Signed)
Addended by: Leeanne Rio on: 05/25/2021 03:19 PM ? ? Modules accepted: Orders ? ?

## 2021-06-01 ENCOUNTER — Other Ambulatory Visit: Payer: Self-pay

## 2021-06-01 ENCOUNTER — Ambulatory Visit (INDEPENDENT_AMBULATORY_CARE_PROVIDER_SITE_OTHER): Payer: Medicare Other

## 2021-06-01 VITALS — BP 124/64 | HR 77 | Ht 65.0 in | Wt 129.0 lb

## 2021-06-01 DIAGNOSIS — Z0000 Encounter for general adult medical examination without abnormal findings: Secondary | ICD-10-CM | POA: Diagnosis not present

## 2021-06-01 NOTE — Progress Notes (Signed)
? ?Subjective:  ? Marie Harvey is a 66 y.o. female who presents for Medicare Annual (Subsequent) preventive examination. ? ?Review of Systems: Defer to PCP. ? ?Cardiac Risk Factors include: advanced age (>9mn, >>66women);hypertension ? ?Objective:  ? ?Vitals: BP 124/64   Pulse 77   Ht '5\' 5"'$  (1.651 m)   Wt 129 lb (58.5 kg)   SpO2 97%   BMI 21.47 kg/m?   Body mass index is 21.47 kg/m?. ? ?Advanced Directives 06/01/2021 01/01/2020 12/26/2019 08/15/2019 10/23/2017 09/11/2017 08/14/2017  ?Does Patient Have a Medical Advance Directive? Yes No No Yes No No No  ?Type of AParamedicof ALa FontaineLiving will - - Living will;Healthcare Power of Attorney - - -  ?Does patient want to make changes to medical advance directive? No - Patient declined - - No - Guardian declined - - -  ?Copy of HGlen Aubreyin Chart? No - copy requested - - No - copy requested - - -  ?Would patient like information on creating a medical advance directive? - No - Patient declined No - Patient declined - No - Patient declined No - Patient declined No - Patient declined  ? ?Tobacco ?Social History  ? ?Tobacco Use  ?Smoking Status Never  ? Passive exposure: Never  ?Smokeless Tobacco Never  ?   ?Clinical Intake: ? ?Pre-visit preparation completed: Yes ? ?Pain : No/denies pain ?Pain Score: 0-No pain ? ?Nutritional Status: BMI of 19-24  Normal ?Diabetes: No ? ?How often do you need to have someone help you when you read instructions, pamphlets, or other written materials from your doctor or pharmacy?: 2 - Rarely ?What is the last grade level you completed in school?: High School ? ?Interpreter Needed?: No ? ?Past Medical History:  ?Diagnosis Date  ? Abnormal RBC   ? h/o. f/u by PCP  ? Endometrial hyperplasia without atypia, complex 02/2000  ? GERD (gastroesophageal reflux disease)   ? H/O cleft lip   ? Palate  ? H/O major depression   ? H/O tetralogy of Fallot repair 1973  ? Heart murmur   ? As a child  ?  Osteoporosis   ? ?Past Surgical History:  ?Procedure Laterality Date  ? ABDOMINAL SURGERY    ? CLEFT LIP REPAIR    ? and palate as child  ? HERNIA REPAIR    ? TETRALOGY OF FALLOT REPAIR  1973  ? ?Family History  ?Problem Relation Age of Onset  ? Uterine cancer Mother   ? Colon cancer Mother   ? Stroke Father   ? Breast cancer Maternal Aunt [phone] ? ?Social History  ? ?Socioeconomic History  ? Marital status: Single  ?  Spouse name: Not on file  ? Number of children: 0  ? Years of education: 170 ? Highest education level: 12th grade  ?Occupational History  ? Occupation: CNA  ?  [place] ?Tobacco Use  ? Smoking status: Never  ?  Passive exposure: Never  ? Smokeless tobacco: Never  ?Vaping Use  ? Vaping Use: Never used  ?Substance and Sexual Activity  ? Alcohol use: No  ? Drug use: No  ? Sexual activity: Not Currently  ?  Birth control/protection: None  ?Other Topics Concern  ? Not on file  ?Social History Narrative  ? Patient lives with her brother and his wife.   ? Patient has never been married.  ? No children.  ? [facility] [employer]  ? Cat Misty.  ?  Enjoys readying.  ? Patient goes to the gym 3x per week- strength training and cardio on treadmill.   ? ?Social Determinants of Health  ? ?Financial Resource Strain: Low Risk   ? Difficulty of Paying Living Expenses: Not hard at all  ?Food Insecurity: No Food Insecurity  ? Worried About Charity fundraiser in the Last Year: Never true  ? Ran Out of Food in the Last Year: Never true  ?Transportation Needs: No Transportation Needs  ? Lack of Transportation (Medical): No  ? Lack of Transportation (Non-Medical): No  ?Physical Activity: Insufficiently Active  ? Days of Exercise per Week: 3 days  ? Minutes of Exercise per Session: 30 min  ?Stress: No Stress Concern Present  ? Feeling of Stress : Not at all  ?Social Connections: Moderately Isolated  ? Frequency of Communication with Friends and Family: Twice a week  ? Frequency of Social Gatherings with Friends and  Family: More than three times a week  ? Attends Religious Services: More than 4 times per year  ? Active Member of Clubs or Organizations: No  ? Attends Archivist Meetings: Never  ? Marital Status: Never married  ? ?Outpatient Encounter Medications as of 06/01/2021  ?Medication Sig  ? fluticasone (FLONASE) 50 MCG/ACT nasal spray Place 2 sprays into both nostrils daily.  ? ibuprofen (ADVIL,MOTRIN) 200 MG tablet Take 400 mg by mouth.  ? latanoprost (XALATAN) 0.005 % ophthalmic solution PLACE 1 DROP INTO BOTH EYES NIGHTLY.  ? valsartan (DIOVAN) 40 MG tablet Take 1 tablet (40 mg total) by mouth daily.  ? zolpidem (AMBIEN) 5 MG tablet TAKE 1 TABLET BY MOUTH ONCE DAILY AT BEDTIME AS NEEDED FOR SLEEP  ? Cholecalciferol (D3 VITAMIN PO) Take 5,000 Units by mouth at bedtime.  ? rosuvastatin (CRESTOR) 5 MG tablet Take 1 tablet (5 mg total) by mouth daily. (Patient not taking: Reported on 06/01/2021)  ? ?No facility-administered encounter medications on file as of 06/01/2021.  ? ?Activities of Daily Living ?In your present state of health, do you have any difficulty performing the following activities: 06/01/2021  ?Hearing? Y  ?Vision? N  ?Difficulty concentrating or making decisions? N  ?Walking or climbing stairs? N  ?Dressing or bathing? N  ?Doing errands, shopping? N  ?Preparing Food and eating ? N  ?Using the Toilet? N  ?In the past six months, have you accidently leaked urine? Y  ?Do you have problems with loss of bowel control? N  ?Managing your Medications? N  ?Managing your Finances? N  ?Some recent data might be hidden  ? ?Patient Care Team: ?Leeanne Rio, MD as PCP - General (Family Medicine) ?Freada Bergeron, MD as Consulting Physician (Cardiology) ?Rondel Oh, MD as Referring Physician (Ophthalmology) ?Ladene Artist, MD as Consulting Physician (Gastroenterology) ?   ?Assessment:  ? This is a routine wellness examination for Marie Harvey. ? ?Exercise Activities and Dietary  recommendations ?Current Exercise Habits: Home exercise routine, Type of exercise: treadmill;strength training/weights, Time (Minutes): 30, Frequency (Times/Week): 3, Weekly Exercise (Minutes/Week): 90, Intensity: Moderate, Exercise limited by: cardiac condition(s) ? ? Goals   ? ?  Patient Stated   ?  Reduce soda intake. ?Patient reports drinking ~3 cokes (12 oz each) per day.  ?  ? ?  ? ?Fall Risk ?Fall Risk  06/01/2021 02/15/2021 12/26/2019 08/15/2019 05/05/2019  ?Falls in the past year? 0 0 0 0 1  ?Number falls in past yr: 0 - 0 - 0  ?Injury with Fall? 0 -  0 - 0  ?Risk for fall due to : No Fall Risks;Impaired balance/gait - - - -  ?Follow up Falls prevention discussed - - - Falls evaluation completed;Education provided  ? ?Normal gait and balance. Patient does not use assistive devices to ambulate.  ? ?Is the patient's home free of loose throw rugs in walkways, pet beds, electrical cords, etc?   yes ?     Grab bars in the bathroom? yes ?     Handrails on the stairs?   yes ?     Adequate lighting?   yes ? ?Patient rating of health (0-10) scale: 10  ? ?Depression Screen ?PHQ 2/9 Scores 06/01/2021 02/15/2021 10/14/2020 03/23/2020  ?PHQ - 2 Score 0 0 0 0  ?PHQ- 9 Score - '3 2 3  '$ ?  ?Cognitive Function ?6CIT Screen 06/01/2021  ?What Year? 0 points  ?What month? 0 points  ?What time? 0 points  ?Count back from 20 0 points  ?Months in reverse 0 points  ?Repeat phrase 0 points  ?Total Score 0  ? ?Immunization History  ?Administered Date(s) Administered  ? Influenza Split 12/19/2010, 12/19/2011  ? Influenza Whole 12/20/2006, 12/30/2007, 12/22/2008, 12/22/2009  ? Influenza,inj,Quad PF,6+ Mos 12/20/2012, 12/25/2013  ? Influenza-Unspecified 01/19/2016, 01/03/2017, 01/15/2018, 01/14/2019, 01/06/2020, 01/01/2021  ? Moderna Sars-Covid-2 Vaccination 03/31/2019, 05/01/2019, 01/17/2020, 07/31/2020  ? PNEUMOCOCCAL CONJUGATE-20 12/15/2020  ? PPD Test 11/16/2015  ? Pension scheme manager 66yr & up 01/14/2021  ? Pneumococcal  Polysaccharide-23 02/17/1997, 02/20/2019  ? Td 08/19/1994, 10/16/2007  ? Tdap 04/24/2020  ? Zoster Recombinat (Shingrix) 05/08/2018, 03/25/2021  ? Zoster, Live 05/19/2015  ? ?Screening Tests ?Health Maintenance  ?Topic Dat

## 2021-06-03 NOTE — Patient Instructions (Signed)
Thank you for taking time to come for your Medicare Wellness Visit. I appreciate your ongoing commitment to your health goals. Please review the following plan we discussed and let me know if I can assist you in the future.  ?  ?These are the goals we discussed: ? ? Goals   ? ?  Patient Stated   ?  Reduce soda intake. ?Patient reports drinking ~3 cokes (12 oz each) per day.  ?  ? ?  ? ?We also discussed recommended health maintenance. As discussed, you are due for: ?Health Maintenance  ?Topic Date Due  ? COLONOSCOPY (Pts 45-51yr Insurance coverage will need to be confirmed)  03/28/2018  ? MAMMOGRAM  11/09/2022  ? TETANUS/TDAP  04/24/2030  ? Pneumonia Vaccine 68 Years old  Completed  ? INFLUENZA VACCINE  Completed  ? DEXA SCAN  Completed  ? COVID-19 Vaccine  Completed  ? Hepatitis C Screening  Completed  ? Zoster Vaccines- Shingrix  Completed  ? HPV VACCINES  Aged Out  ? ?PCP apt 3/23. ?Schedule your colonoscopy. ?Fill out advance directive or bring copy of your own.  ? ?Preventive Care 682Years and Older, Female ?Preventive care refers to lifestyle choices and visits with your health care provider that can promote health and wellness. Preventive care visits are also called wellness exams. ?What can I expect for my preventive care visit? ?Counseling ?Your health care provider may ask you questions about your: ?Medical history, including: ?Past medical problems. ?Family medical history. ?Pregnancy and menstrual history. ?History of falls. ?Current health, including: ?Memory and ability to understand (cognition). ?Emotional well-being. ?Home life and relationship well-being. ?Sexual activity and sexual health. ?Lifestyle, including: ?Alcohol, nicotine or tobacco, and drug use. ?Access to firearms. ?Diet, exercise, and sleep habits. ?Work and work eStatistician ?Sunscreen use. ?Safety issues such as seatbelt and bike helmet use. ?Physical exam ?Your health care provider will check your: ?Height and weight. These may be  used to calculate your BMI (body mass index). BMI is a measurement that tells if you are at a healthy weight. ?Waist circumference. This measures the distance around your waistline. This measurement also tells if you are at a healthy weight and may help predict your risk of certain diseases, such as type 2 diabetes and high blood pressure. ?Heart rate and blood pressure. ?Body temperature. ?Skin for abnormal spots. ?What immunizations do I need? ?Vaccines are usually given at various ages, according to a schedule. Your health care provider will recommend vaccines for you based on your age, medical history, and lifestyle or other factors, such as travel or where you work. ?What tests do I need? ?Screening ?Your health care provider may recommend screening tests for certain conditions. This may include: ?Lipid and cholesterol levels. ?Hepatitis C test. ?Hepatitis B test. ?HIV (human immunodeficiency virus) test. ?STI (sexually transmitted infection) testing, if you are at risk. ?Lung cancer screening. ?Colorectal cancer screening. ?Diabetes screening. This is done by checking your blood sugar (glucose) after you have not eaten for a while (fasting). ?Mammogram. Talk with your health care provider about how often you should have regular mammograms. ?BRCA-related cancer screening. This may be done if you have a family history of breast, ovarian, tubal, or peritoneal cancers. ?Bone density scan. This is done to screen for osteoporosis. ?Talk with your health care provider about your test results, treatment options, and if necessary, the need for more tests. ?Follow these instructions at home: ?Eating and drinking ? ?Eat a diet that includes fresh fruits and vegetables, whole  grains, lean protein, and low-fat dairy products. Limit your intake of foods with high amounts of sugar, saturated fats, and salt. ?Take vitamin and mineral supplements as recommended by your health care provider. ?Do not drink alcohol if your health  care provider tells you not to drink. ?If you drink alcohol: ?Limit how much you have to 0-1 drink a day. ?Know how much alcohol is in your drink. In the [place] one drink equals one 12 oz bottle of beer (355 mL), one 5 oz glass of wine (148 mL), or one 1? oz glass of hard liquor (44 mL). ?Lifestyle ?Brush your teeth every morning and night with fluoride toothpaste. Floss one time each day. ?Exercise for at least 30 minutes 5 or more days each week. ?Do not use any products that contain nicotine or tobacco. These products include cigarettes, chewing tobacco, and vaping devices, such as e-cigarettes. If you need help quitting, ask your health care provider. ?Do not use drugs. ?If you are sexually active, practice safe sex. Use a condom or other form of protection in order to prevent STIs. ?Take aspirin only as told by your health care provider. Make sure that you understand how much to take and what form to take. Work with your health care provider to find out whether it is safe and beneficial for you to take aspirin daily. ?Ask your health care provider if you need to take a cholesterol-lowering medicine (statin). ?Find healthy ways to manage stress, such as: ?Meditation, yoga, or listening to music. ?Journaling. ?Talking to a trusted person. ?Spending time with friends and family. ?Minimize exposure to UV radiation to reduce your risk of skin cancer. ?Safety ?Always wear your seat belt while driving or riding in a vehicle. ?Do not drive: ?If you have been drinking alcohol. Do not ride with someone who has been drinking. ?When you are tired or distracted. ?While texting. ?If you have been using any mind-altering substances or drugs. ?Wear a helmet and other protective equipment during sports activities. ?If you have firearms in your house, make sure you follow all gun safety procedures. ?What's next? ?Visit your health care provider once a year for an annual wellness visit. ?Ask your health care provider how often you  should have your eyes and teeth checked. ?Stay up to date on all vaccines. ?This information is not intended to replace advice given to you by your health care provider. Make sure you discuss any questions you have with your health care provider. ?Document Revised: 09/01/2020 Document Reviewed: 09/01/2020 ?Elsevier Patient Education ? Heil. ? ?Our clinic's number is [phone]. Please call with questions or concerns about what we discussed today. ? ? ?

## 2021-06-05 NOTE — Progress Notes (Signed)
I have reviewed this visit and agree with the documentation.  ?Leeanne Rio, MD  ?

## 2021-06-08 NOTE — Progress Notes (Signed)
?  Date of Visit: 06/09/2021  ? ?SUBJECTIVE:  ? ?HPI: ? ?Marie "[name]" presents today for routine follow up, also to discuss breast lump. ? ?Insomnia - taking zolipidem '5mg'$  daily with good ability to sleep. Denies excessive sedation or problems with the medication. Sleeps well with it. ? ?Hyperlipidemia - previously taking crestor '5mg'$  daily but has run out. Has upcoming appointment with cardiology to follow up in May. ? ?Hypertension - taking valsartan '40mg'$  daily, tolerating well without issues ? ?Face skin - skin on face itches some, has to wear mask at work, also thinks skin could be dry, tried dove lotion ? ?Breast mass - noticed this for a month or so. Doesn't really hurt, just wants it checked out. No drainage from nipples. Located on R side upper inner quadrant. Had normal screening mammogram in August, BI-RADS 1. ? ?OBJECTIVE:  ? ?BP 126/63   Pulse 84   Wt 129 lb 12.8 oz (58.9 kg)   SpO2 99%   BMI 21.60 kg/m?  ?Gen: no acute distress, pleasant, cooperative ?HEENT: normocephalic, atraumatic  ?Heart: regular rate and rhythm, + murmur (history repaired tetralogy of fallot) ?Lungs: clear to auscultation bilaterally, normal work of breathing ?Breasts: bilateral breasts normal in appearance. No erythema, deformity, or nipple discharge. Small area of prominence at [facility] position on R breast which I think is likely a rib. No axillary lymphadenopathy. Exam chaperoned by Delray Alt CMA ?Skin: no lesions or rashes appreciated on face ? ?ASSESSMENT/PLAN:  ? ?Health maintenance:  ?-advised to schedule colonoscopy, has niece's wedding upcoming and will schedule for after that ? ?Insomnia ?Doing well, continue zolpidem. Uses responsibly. Refill for 6 months.  ? ?Hypertension ?Well controlled. Continue valsartan ? ?Hyperlipidemia ?Refill crestor so patient has it. Has upcoming appointment with cardiology and patient reports lipids to be checked there. ? ?Face itching - likely dry skin, no rashes seen, recommend  moisturizing face, follow up if no improvement ? ?Breast lump - based on exam today I suspect this is just a prominent rib; however will order diagnostic mammo/ultrasound to evaluate further out of an abundance of caution. [name] is agreeable with this plan.  ? ?FOLLOW UP: ?Follow up in 6 mos for above issues ? ?[place]. Ardelia Mems, MD ?[facility] ?

## 2021-06-09 ENCOUNTER — Other Ambulatory Visit: Payer: Self-pay | Admitting: Family Medicine

## 2021-06-09 ENCOUNTER — Ambulatory Visit (INDEPENDENT_AMBULATORY_CARE_PROVIDER_SITE_OTHER): Payer: Medicare Other | Admitting: Family Medicine

## 2021-06-09 ENCOUNTER — Other Ambulatory Visit: Payer: Self-pay

## 2021-06-09 ENCOUNTER — Encounter: Payer: Self-pay | Admitting: Family Medicine

## 2021-06-09 VITALS — BP 126/63 | HR 84 | Wt 129.8 lb

## 2021-06-09 DIAGNOSIS — N6312 Unspecified lump in the right breast, upper inner quadrant: Secondary | ICD-10-CM

## 2021-06-09 DIAGNOSIS — I10 Essential (primary) hypertension: Secondary | ICD-10-CM

## 2021-06-09 DIAGNOSIS — G4700 Insomnia, unspecified: Secondary | ICD-10-CM | POA: Diagnosis not present

## 2021-06-09 DIAGNOSIS — E785 Hyperlipidemia, unspecified: Secondary | ICD-10-CM | POA: Diagnosis not present

## 2021-06-09 MED ORDER — ZOLPIDEM TARTRATE 5 MG PO TABS
ORAL_TABLET | ORAL | 5 refills | Status: DC
Start: 2021-06-09 — End: 2021-12-13

## 2021-06-09 MED ORDER — ROSUVASTATIN CALCIUM 5 MG PO TABS: 5.0000 mg | ORAL_TABLET | Freq: Every day | ORAL | 1 refills | Status: DC

## 2021-06-09 NOTE — Patient Instructions (Signed)
It was great to see you again today! ? ?Setting up mammogram of R breast ? ?Refilled your sleep medication and cholesterol medication ? ?Blood pressure looks good today ? ?Moisturize your face to help w/ dry skin ? ?Follow up with me in 6 months, sooner if needed ? ?Be well, ?Dr. Ardelia Mems  ?

## 2021-06-10 DIAGNOSIS — E785 Hyperlipidemia, unspecified: Secondary | ICD-10-CM | POA: Insufficient documentation

## 2021-06-10 DIAGNOSIS — I10 Essential (primary) hypertension: Secondary | ICD-10-CM | POA: Insufficient documentation

## 2021-06-10 NOTE — Assessment & Plan Note (Signed)
Refill crestor so patient has it. Has upcoming appointment with cardiology and patient reports lipids to be checked there. ?

## 2021-06-10 NOTE — Assessment & Plan Note (Signed)
Well controlled. Continue valsartan ?

## 2021-06-10 NOTE — Assessment & Plan Note (Signed)
Doing well, continue zolpidem. Uses responsibly. Refill for 6 months.  ?

## 2021-06-15 ENCOUNTER — Other Ambulatory Visit: Payer: Self-pay

## 2021-06-15 DIAGNOSIS — Z13220 Encounter for screening for lipoid disorders: Secondary | ICD-10-CM

## 2021-06-15 DIAGNOSIS — Q249 Congenital malformation of heart, unspecified: Secondary | ICD-10-CM

## 2021-06-15 MED ORDER — VALSARTAN 40 MG PO TABS: 40.0000 mg | ORAL_TABLET | Freq: Every day | ORAL | 0 refills | Status: DC

## 2021-06-29 ENCOUNTER — Ambulatory Visit
Admission: RE | Admit: 2021-06-29 | Discharge: 2021-06-29 | Disposition: A | Discharge status: Home / Self Care | Payer: Medicare Other | Source: Ambulatory Visit | Attending: Family Medicine | Admitting: Family Medicine

## 2021-06-29 DIAGNOSIS — R922 Inconclusive mammogram: Secondary | ICD-10-CM | POA: Diagnosis not present

## 2021-06-29 DIAGNOSIS — N6312 Unspecified lump in the right breast, upper inner quadrant: Secondary | ICD-10-CM

## 2021-07-06 ENCOUNTER — Ambulatory Visit: Payer: Medicare Other | Admitting: Gastroenterology

## 2021-07-06 ENCOUNTER — Encounter: Payer: Self-pay | Admitting: Gastroenterology

## 2021-07-06 VITALS — BP 128/80 | HR 72 | Ht 65.0 in | Wt 128.2 lb

## 2021-07-06 DIAGNOSIS — Z800 Family history of malignant neoplasm of digestive organs: Secondary | ICD-10-CM | POA: Diagnosis not present

## 2021-07-06 DIAGNOSIS — K5900 Constipation, unspecified: Secondary | ICD-10-CM | POA: Diagnosis not present

## 2021-07-06 DIAGNOSIS — K921 Melena: Secondary | ICD-10-CM

## 2021-07-06 MED ORDER — PLENVU 140 G PO SOLR
1.0000 | Freq: Once | ORAL | 0 refills | Status: AC
Start: 2021-07-06 — End: 2021-07-06

## 2021-07-06 NOTE — Progress Notes (Signed)
? ? ?  History of Present Illness: This is a 66 year old female returning for follow-up of constipation, FHCC and intermittent small-volume hematochezia.  She was seen in May 11, 2020 for the same problems.  Colonoscopy was recommended however she did not proceed.  She relates ongoing difficulties with constipation, incomplete fecal evacuation and straining.  She notes small amounts of bright red blood per rectum intermittently.  Colonoscopy in January 2015 by Harrell Lark, MD showed an inadequate bowel preparation with no abnormalities listed.  A 5-year interval repeat colonoscopy was recommended.  Denies weight loss, abdominal pain, diarrhea, change in stool caliber, melena, nausea, vomiting, dysphagia, reflux symptoms, chest pain. ? ? ?Current Medications, Allergies, Past Medical History, Past Surgical History, Family History and Social History were reviewed in [employer] record. ? ? ?Physical Exam: ?General: Well developed, well nourished, no acute distress ?Head: Normocephalic and atraumatic ?Eyes: Sclerae anicteric, EOMI ?Ears: Normal auditory acuity ?Mouth: Not examined, mask on during Covid-19 pandemic ?Lungs: Clear throughout to auscultation ?Heart: Regular rate and rhythm; no murmurs, rubs or bruits ?Abdomen: Soft, non tender and non distended. No masses, hepatosplenomegaly or hernias noted. Normal Bowel sounds ?Rectal: Deferred to colonoscopy ?Musculoskeletal: Symmetrical with no gross deformities  ?Pulses:  Normal pulses noted ?Extremities: No clubbing, cyanosis, edema or deformities noted ?Neurological: Alert oriented x 4, grossly nonfocal ?Psychological:  Alert and cooperative. Normal mood and affect ? ? ?Assessment and Recommendations: ? ?Chronic constipation, intermittent small-volume hematochezia, family history of colon cancer in her mother at age 64. Miralax 1-2 times daily titrated for a complete BM daily. Schedule colonoscopy with an extended bowel prep given prior  inadequate bowel prep. The risks (including bleeding, perforation, infection, missed lesions, medication reactions and possible hospitalization or surgery if complications occur), benefits, and alternatives to colonoscopy with possible biopsy and possible polypectomy were discussed with the patient and they consent to proceed.   ?

## 2021-07-06 NOTE — Patient Instructions (Signed)
Start over the counter Miralax 1-2 x daily. ? ?You have been scheduled for a colonoscopy. Please follow written instructions given to you at your visit today.  ?Please pick up your prep supplies at the pharmacy within the next 1-3 days. ?If you use inhalers (even only as needed), please bring them with you on the day of your procedure. ? ?The [facility] providers would like to encourage you to use [facility] to communicate with providers for non-urgent requests or questions.  Due to long hold times on the telephone, sending your provider a message by [facility] may be a faster and more efficient way to get a response.  Please allow 48 business hours for a response.  Please remember that this is for non-urgent requests.  ? ?Due to recent changes in healthcare laws, you may see the results of your imaging and laboratory studies on MyChart before your provider has had a chance to review them.  We understand that in some cases there may be results that are confusing or concerning to you. Not all laboratory results come back in the same time frame and the provider may be waiting for multiple results in order to interpret others.  Please give [place] 48 hours in order for your provider to thoroughly review all the results before contacting the office for clarification of your results.  ? ?Thank you for choosing me and [facility]. ? ?Malcolm T. Dagoberto Ligas., MD., [facility] ? ?

## 2021-07-14 DIAGNOSIS — Q213 Tetralogy of Fallot: Secondary | ICD-10-CM | POA: Diagnosis not present

## 2021-07-14 DIAGNOSIS — I491 Atrial premature depolarization: Secondary | ICD-10-CM | POA: Diagnosis not present

## 2021-07-14 DIAGNOSIS — I371 Nonrheumatic pulmonary valve insufficiency: Secondary | ICD-10-CM | POA: Diagnosis not present

## 2021-07-14 DIAGNOSIS — Z8774 Personal history of (corrected) congenital malformations of heart and circulatory system: Secondary | ICD-10-CM | POA: Diagnosis not present

## 2021-07-16 NOTE — Progress Notes (Deleted)
?Cardiology Office Note:   ? ?Date:  07/16/2021  ? ?ID:  Marie Harvey, DOB September 21, 1955, MRN [id number] ? ?PCP:  Leeanne Rio, MD ?  ?[place]  ?Cardiologist:  None  ?Advanced Practice Provider:  No care team member to display ?Electrophysiologist:  None  ? ?Referring MD: Leeanne Rio, MD  ? ? ? ?History of Present Illness:   ? ?Marie Harvey is a 66 y.o. female with a hx of tetrology of fallot s/p repair 1973, GERD and depression who presents to clinic for follow-up. ? ?Patient underwent repair in 1973 for tetralogy of fallot at [facility]. Was followed with congenital specialist for several years but had not seen a Cardiologist in 1 years. TTE 05/05/20 with LVEF 45-50%, possible small residual VSD, moderately enlarged RV with preserved RV function, d-shaped septum mild PS with mean gradient 40mHg, peak gradient 196mg, severe PR; mild TR. Severely enlarged LA and RA, and hyperechoic structure seen at the apex of RV. Mildly dilated ascending aorta 3693m? ?She subsequently underwent cardiac MR on 07/11/20 which demonstrated mild-to-moderate AR, severe PR, moderate PS, ASD, perimembranous VSD, Qp:Qs 1.37, LVEF 50%, mild RV enlargement with preserved RV systolic function, no RV thrombus, moderate RAE, mild ascending aortia dilation. ? ?Saw Dr. FleRaul Del 06/10/20 where a Holter monitor was placed for palpitations. Planned for continued monitoring of her PR given relative lack of symptoms. ? ?Returned to visit Dr. FleRaul Del 07/14/21 where she was doing well. She remained active without significant symptoms. Planned for continued watchful waiting.  ? ?Today, *** ? ? ?Past Medical History:  ?Diagnosis Date  ? Abnormal RBC   ? h/o. f/u by PCP  ? Endometrial hyperplasia without atypia, complex 02/2000  ? GERD (gastroesophageal reflux disease)   ? H/O cleft lip   ? Palate  ? H/O major depression   ? H/O tetralogy of Fallot repair 1973  ? Heart murmur   ? As a child  ?  Osteoporosis   ? ? ?Past Surgical History:  ?Procedure Laterality Date  ? ABDOMINAL SURGERY    ? CLEFT LIP REPAIR    ? and palate as child  ? HERNIA REPAIR    ? TETRALOGY OF FALLOT REPAIR  1973  ? ? ?Current Medications: ?No outpatient medications have been marked as taking for the 07/20/21 encounter (Appointment) with PemFreada BergeronD.  ?  ? ?Allergies:   Codeine, Calcium-containing compounds, Penicillins, and Vitamin d analogs  ? ?Social History  ? ?Socioeconomic History  ? Marital status: Single  ?  Spouse name: Not on file  ? Number of children: 0  ? Years of education: 12 41 Highest education level: 12th grade  ?Occupational History  ? Occupation: CNA  ?  [place] [employer]  ? Smoking status: Never  ?  Passive exposure: Never  ? Smokeless tobacco: Never  ?Vaping Use  ? Vaping Use: Never used  ?Substance and Sexual Activity  ? Alcohol use: No  ? Drug use: No  ? Sexual activity: Not Currently  ?  Birth control/protection: None  ?Other Topics Concern  ? Not on file  ?Social History Narrative  ? Patient lives with her brother and his wife.   ? Patient has never been married.  ? No children.  ? CNA at [employer]? Cat Misty.  ? Enjoys readying.  ? Patient goes to the gym 3x per week- strength training and cardio on treadmill.   ? ?  Social Determinants of Health  ? ?Financial Resource Strain: Low Risk   ? Difficulty of Paying Living Expenses: Not hard at all  ?Food Insecurity: No Food Insecurity  ? Worried About Charity fundraiser in the Last Year: Never true  ? Ran Out of Food in the Last Year: Never true  ?Transportation Needs: No Transportation Needs  ? Lack of Transportation (Medical): No  ? Lack of Transportation (Non-Medical): No  ?Physical Activity: Insufficiently Active  ? Days of Exercise per Week: 3 days  ? Minutes of Exercise per Session: 30 min  ?Stress: No Stress Concern Present  ? Feeling of Stress : Not at all  ?Social Connections: Moderately Isolated  ? Frequency of  Communication with Friends and Family: Twice a week  ? Frequency of Social Gatherings with Friends and Family: More than three times a week  ? Attends Religious Services: More than 4 times per year  ? Active Member of Clubs or Organizations: No  ? Attends Archivist Meetings: Never  ? Marital Status: Never married  ?  ? ?Family History: ?The patient's family history includes Breast cancer (age of onset: 81) in her maternal aunt; Colon cancer in her mother; Stroke in her father; Uterine cancer in her mother. There is no history of Esophageal cancer. ? ?ROS:   ?Please see the history of present illness.    ?Review of Systems  ?Constitutional:  Negative for chills and fever.  ?HENT:  Negative for hearing loss and sore throat.   ?Eyes:  Negative for blurred vision, double vision and redness.  ?Respiratory:  Negative for shortness of breath.   ?Cardiovascular:  Negative for chest pain, palpitations, orthopnea, claudication, leg swelling and PND.  ?Gastrointestinal:  Positive for heartburn. Negative for blood in stool, melena, nausea and vomiting.  ?Genitourinary:  Negative for dysuria and flank pain.  ?Musculoskeletal:  Negative for falls and myalgias.  ?Neurological:  Negative for dizziness and loss of consciousness.  ?Endo/Heme/Allergies:  Negative for polydipsia.  ?Psychiatric/Behavioral:  Negative for substance abuse.   ? ?EKGs/Labs/Other Studies Reviewed:   ? ?The following studies were reviewed today: ?TTE 05/05/20: ?1. Patient is s/p repair of Tetrology of Fallot.  ? 2. The patient is s/p VSD patch repair. Suspect a small residual,  ?restrictive VSD in the the membranous poriton of the septum with L-->R  ?shunting (best seen on clip 15).  ? 3. The pulmonic valve appears thickened with at least moderately  ?decreased leaflet excursion. There is mild pulmonic stenosis with mean  ?gradient 73mHg, peak gradient 154mg. There is severe pulmonic valve  ?regurgitation.  ? 4. The right ventricular size is  moderately enlarged with preserved RV  ?systolic function. There is normal pulmonary artery systolic pressure.  ? 5. There is a hyperechoic structure seen at the apex of the RV. May  ?represent trabeculations, however, thrombus is on the differential.  ?Fotunately, the RV systolic funciton appears preserved. Recommend definity  ?contrast vs TEE for further evaluation.  ? 6. Left ventricular ejection fraction, by estimation, is 45 to 50%. The  ?left ventricle has mildly decreased function. The left ventricle  ?demonstrates global hypokinesis.  ? 7. The interventricular septum is flattened in diastole ('D' shaped left  ?ventricle), consistent with right ventricular volume overload.  ? 8. Left atrial size was severely dilated.  ? 9. Right atrial size was severely dilated.  ?10. The mitral valve is grossly normal. Trivial mitral valve  ?regurgitation.  ?11. The tricuspid valve is mildly thickened. There is  mild tricuspid  ?regurgitation  ?12. The aortic valve is tricuspid. There is mild calcification of the  ?aortic valve. There is mild thickening of the aortic valve. Aortic valve  ?regurgitation is mild. Mild aortic valve sclerosis is present, with no  ?evidence of aortic valve stenosis.  ?13. Aortic dilatation noted. There is mild dilatation of the ascending  ?aorta, measuring 36 mm.  ?14. The inferior vena cava is normal in size with greater than 50%  ?respiratory variabilty, suggesting right atrial pressure of 3 mmHg.  ?15. Consider TEE for further evaluation of hyperechoic structure seen in  ?RV apex and further evaluation of the pulmonic valve.  ? ?Comparison(s): Compared to prior echo report in 2013, the pulmonic valve  ?regurgitation now appears severe with moderate dilation of the RV. There  ?is evidence of RA/RV pressure overload. The LVEF now appears to be around  ?50%. The patch repaired VSD is  ?visualized. There is a small, residual membranous VSD suspected. Rest of  ?the changes in detailed report as  above.  ? ?Cardiac MRI 07/08/20: ?FINDINGS: ?1. Small left ventricular size, with LVEDD 50 mm, but LVEDVI 44.39 ?ml/m2. ?  ?Normal left ventricular thickness, with intraventricular septal ?thickness of 8 mm and posterior wall thick

## 2021-07-20 ENCOUNTER — Ambulatory Visit: Payer: Medicare Other | Admitting: Cardiology

## 2021-07-20 ENCOUNTER — Encounter: Payer: Self-pay | Admitting: Cardiology

## 2021-07-20 VITALS — BP 110/80 | HR 61 | Ht 65.0 in | Wt 126.8 lb

## 2021-07-20 DIAGNOSIS — Q213 Tetralogy of Fallot: Secondary | ICD-10-CM | POA: Diagnosis not present

## 2021-07-20 DIAGNOSIS — I371 Nonrheumatic pulmonary valve insufficiency: Secondary | ICD-10-CM

## 2021-07-20 DIAGNOSIS — E7800 Pure hypercholesterolemia, unspecified: Secondary | ICD-10-CM

## 2021-07-20 DIAGNOSIS — E785 Hyperlipidemia, unspecified: Secondary | ICD-10-CM | POA: Diagnosis not present

## 2021-07-20 DIAGNOSIS — Q249 Congenital malformation of heart, unspecified: Secondary | ICD-10-CM | POA: Diagnosis not present

## 2021-07-20 DIAGNOSIS — I10 Essential (primary) hypertension: Secondary | ICD-10-CM

## 2021-07-20 LAB — LIPID PANEL
Chol/HDL Ratio: 2.5 ratio (ref 0.0–4.4)
Cholesterol, Total: 148 mg/dL (ref 100–199)
HDL: 60 mg/dL (ref >39)
LDL Chol Calc (NIH): 77 mg/dL (ref 0–99)
Triglycerides: 52 mg/dL (ref 0–149)
VLDL Cholesterol Cal: 11 mg/dL (ref 5–40)

## 2021-07-20 NOTE — Progress Notes (Signed)
?Cardiology Office Note:   ? ?Date:  07/20/2021  ? ?ID:  Marie Harvey, DOB 09/16/1955, MRN [id number] ? ?PCP:  Leeanne Rio, MD ?  ?[place]  ?Cardiologist:  None  ?Advanced Practice Provider:  No care team member to display ?Electrophysiologist:  None  ? ?Referring MD: Leeanne Rio, MD  ? ? ? ?History of Present Illness:   ? ?Marie Harvey is a 66 y.o. female with a hx of tetrology of fallot s/p repair 1973, GERD and depression who presents to clinic for follow-up. ? ?Patient underwent repair in 1973 for tetralogy of fallot at [facility]. Was followed with congenital specialist for several years but had not seen a Cardiologist in 26 years. TTE 05/05/20 with LVEF 45-50%, possible small residual VSD, moderately enlarged RV with preserved RV function, d-shaped septum mild PS with mean gradient 95mHg, peak gradient 178mg, severe PR; mild TR. Severely enlarged LA and RA, and hyperechoic structure seen at the apex of RV. Mildly dilated ascending aorta 3663m? ?She subsequently underwent cardiac MR on 07/11/20 which demonstrated mild-to-moderate AR, severe PR, moderate PS, ASD, perimembranous VSD, Qp:Qs 1.37, LVEF 50%, mild RV enlargement with preserved RV systolic function, no RV thrombus, moderate RAE, mild ascending aortia dilation. ? ?Saw Dr. FleRaul Del 06/10/20 where a Holter monitor was placed for palpitations. Planned for continued monitoring of her PR given relative lack of symptoms. ? ?Returned to visit Dr. FleRaul Del 07/14/21 where she was doing well. She remained active without significant symptoms. Planned for continued watchful waiting.  ? ?Today, the patient states that she occasionally has trouble sleeping, but is otherwise feeling well. Sometimes she will feel her heart skip a beat. This is not bothersome for her. ? ?She presents a BP log showing well controlled readings at home. In clinic she has a BP of 110/80. ? ?Since her last visit [place] without significant exertional symptoms. Currently she continues to exercise routinely with no anginal symptoms. ? ?She denies any chest pain, shortness of breath, or peripheral edema. No lightheadedness, headaches, syncope, orthopnea, or PND. ? ?After discussion, she would prefer to defer any valve repair procedures. She will continue to monitor for worsening symptoms. ? ?Past Medical History:  ?Diagnosis Date  ? Abnormal RBC   ? h/o. f/u by PCP  ? Endometrial hyperplasia without atypia, complex 02/2000  ? GERD (gastroesophageal reflux disease)   ? H/O cleft lip   ? Palate  ? H/O major depression   ? H/O tetralogy of Fallot repair 1973  ? Heart murmur   ? As a child  ? Osteoporosis   ? ? ?Past Surgical History:  ?Procedure Laterality Date  ? ABDOMINAL SURGERY    ? CLEFT LIP REPAIR    ? and palate as child  ? HERNIA REPAIR    ? TETRALOGY OF FALLOT REPAIR  1973  ? ? ?Current Medications: ?Current Meds  ?Medication Sig  ? Cholecalciferol (D3 VITAMIN PO) Take 5,000 Units by mouth at bedtime.  ? fluticasone (FLONASE) 50 MCG/ACT nasal spray Place 2 sprays into both nostrils daily.  ? ibuprofen (ADVIL,MOTRIN) 200 MG tablet Take 400 mg by mouth as needed. Takes motrin  ? latanoprost (XALATAN) 0.005 % ophthalmic solution PLACE 1 DROP INTO BOTH EYES NIGHTLY.  ? rosuvastatin (CRESTOR) 5 MG tablet Take 1 tablet (5 mg total) by mouth daily.  ? valsartan (DIOVAN) 40 MG tablet Take 1 tablet (40 mg total)  by mouth daily.  ? zolpidem (AMBIEN) 5 MG tablet TAKE 1 TABLET BY MOUTH ONCE DAILY AT BEDTIME AS NEEDED FOR SLEEP  ?  ? ?Allergies:   Codeine, Calcium-containing compounds, Penicillins, and Vitamin d analogs  ? ?Social History  ? ?Socioeconomic History  ? Marital status: Single  ?  Spouse name: Not on file  ? Number of children: 0  ? Years of education: 70  ? Highest education level: 12th grade  ?Occupational History  ? Occupation: CNA  ?  Comment: [employer]  ?Tobacco Use  ? Smoking status:  Never  ?  Passive exposure: Never  ? Smokeless tobacco: Never  ?Vaping Use  ? Vaping Use: Never used  ?Substance and Sexual Activity  ? Alcohol use: No  ? Drug use: No  ? Sexual activity: Not Currently  ?  Birth control/protection: None  ?Other Topics Concern  ? Not on file  ?Social History Narrative  ? Patient lives with her brother and his wife.   ? Patient has never been married.  ? No children.  ? CNA at [employer].  ? Cat Misty.  ? Enjoys readying.  ? Patient goes to the gym 3x per week- strength training and cardio on treadmill.   ? ?Social Determinants of Health  ? ?Financial Resource Strain: Low Risk   ? Difficulty of Paying Living Expenses: Not hard at all  ?Food Insecurity: No Food Insecurity  ? Worried About Charity fundraiser in the Last Year: Never true  ? Ran Out of Food in the Last Year: Never true  ?Transportation Needs: No Transportation Needs  ? Lack of Transportation (Medical): No  ? Lack of Transportation (Non-Medical): No  ?Physical Activity: Insufficiently Active  ? Days of Exercise per Week: 3 days  ? Minutes of Exercise per Session: 30 min  ?Stress: No Stress Concern Present  ? Feeling of Stress : Not at all  ?Social Connections: Moderately Isolated  ? Frequency of Communication with Friends and Family: Twice a week  ? Frequency of Social Gatherings with Friends and Family: More than three times a week  ? Attends Religious Services: More than 4 times per year  ? Active Member of Clubs or Organizations: No  ? Attends Archivist Meetings: Never  ? Marital Status: Never married  ?  ? ?Family History: ?The patient's family history includes Breast cancer (age of onset: 91) in her maternal aunt; Colon cancer in her mother; Stroke in her father; Uterine cancer in her mother. There is no history of Esophageal cancer. ? ?ROS:   ?Please see the history of present illness.    ?Review of Systems  ?Constitutional:  Negative for chills and fever.  ?HENT:  Negative for hearing loss and sore  throat.   ?Eyes:  Negative for blurred vision, double vision and redness.  ?Respiratory:  Negative for shortness of breath.   ?Cardiovascular:  Positive for palpitations. Negative for chest pain, orthopnea, claudication, leg swelling and PND.  ?Gastrointestinal:  Positive for heartburn. Negative for blood in stool, melena, nausea and vomiting.  ?Genitourinary:  Negative for dysuria and flank pain.  ?Musculoskeletal:  Negative for falls and myalgias.  ?Neurological:  Negative for dizziness and loss of consciousness.  ?Endo/Heme/Allergies:  Negative for polydipsia.  ?Psychiatric/Behavioral:  Negative for substance abuse. The patient has insomnia.   ? ?EKGs/Labs/Other Studies Reviewed:   ? ?The following studies were reviewed today: ? ?Holter Monitor 10/2020: ?Holter 11/08/2020: ?*The observed rhythms are sinus bradycardia to sinus tachycardia. ?*The  Maximum Heart Rate recorded was 138 bpm, Day 3 / 03:31:00 pm, the Minimum Heart Rate recorded was ?47 bpm, Day 3 / 04:57:44 am and the Average Heart Rate was 80 bpm. ?*There were 356 PVCs with a burden of 0.1 %. ?*There were [phone] PSVCs with a burden of 6.85 %. There were 32 occurrences of Supraventricular Tachycardia ?with the longest episode 5 beats, Day 3 / 09:51:38 am and the fastest episode 124 bpm, Day 2 / 02:52:27 pm. ?*There were 0 Patient triggered events. ?Occasional supraventricular and ventricular ectopy with no sustained arrhythmias ?Normal heart rate range and variability ? ?Cardiac MRI 07/08/20: ?FINDINGS: ?1. Small left ventricular size, with LVEDD 50 mm, but LVEDVI 44.39 ?ml/m2. ?  ?Normal left ventricular thickness, with intraventricular septal ?thickness of 8 mm and posterior wall thickness of 7 mm. ?  ?Mild left ventricular systolic dysfunction (LVEF =50%). There are no ?regional wall motion abnormalities but global hypokinesis. ?  ?There is no late gadolinium enhancement in the left ventricular ?myocardium. ?  ?2. Mild right ventricular enlargement with  RVEDVI 104 mL/m2. ?  ?RVEDV: ?* : 168.20 ml ?  ?RVESV: ?* : 84.05 ml ?  ?RVSV: ?* : 84.15 ml ?  ?RVEF: ?* : 50.03 % ?  ?RVCO: ?* : 5103.33 ml/min ?  ?RVCI: ?* : 3.16 l/min/m ?  ?HR: ?* : 60.6/min ?  ?Normal ri

## 2021-07-20 NOTE — Patient Instructions (Signed)
Medication Instructions:  ? ?Your physician recommends that you continue on your current medications as directed. Please refer to the Current Medication list given to you today. ? ?*If you need a refill on your cardiac medications before your next appointment, please call your pharmacy* ? ? ?Lab Work: ? ?TODAY--LIPIDS ? ?If you have labs (blood work) drawn today and your tests are completely normal, you will receive your results only by: ?MyChart Message (if you have MyChart) OR ?A paper copy in the mail ?If you have any lab test that is abnormal or we need to change your treatment, we will call you to review the results. ? ? ?Follow-Up: ?At [facility], you and your health needs are our priority.  As part of our continuing mission to provide you with exceptional heart care, we have created designated Provider Care Teams.  These Care Teams include your primary Cardiologist (physician) and Advanced Practice Providers (APPs -  Physician Assistants and Nurse Practitioners) who all work together to provide you with the care you need, when you need it. ? ?We recommend signing up for the patient portal called "MyChart".  Sign up information is provided on this After Visit Summary.  MyChart is used to connect with patients for Virtual Visits (Telemedicine).  Patients are able to view lab/test results, encounter notes, upcoming appointments, etc.  Non-urgent messages can be sent to your provider as well.   ?To learn more about what you can do with MyChart, go to [email or web address].   ? ?Your next appointment:   ?6 month(s) ? ?The format for your next appointment:   ?In Person ? ?Provider:   ?DR. PEMBERTON  ? ?Important Information About Sugar ? ? ? ? ? ? ?

## 2021-07-22 ENCOUNTER — Telehealth: Payer: Self-pay | Admitting: Cardiology

## 2021-07-22 NOTE — Telephone Encounter (Signed)
?  Transferred to RN for result ?

## 2021-08-23 ENCOUNTER — Encounter: Payer: Self-pay | Admitting: *Deleted

## 2021-08-30 ENCOUNTER — Encounter: Payer: Medicare Other | Admitting: Gastroenterology

## 2021-10-03 DIAGNOSIS — H40019 Open angle with borderline findings, low risk, unspecified eye: Secondary | ICD-10-CM | POA: Diagnosis not present

## 2021-10-11 ENCOUNTER — Other Ambulatory Visit: Payer: Self-pay

## 2021-10-11 DIAGNOSIS — Q249 Congenital malformation of heart, unspecified: Secondary | ICD-10-CM

## 2021-10-11 DIAGNOSIS — Z13220 Encounter for screening for lipoid disorders: Secondary | ICD-10-CM

## 2021-10-11 MED ORDER — VALSARTAN 40 MG PO TABS: 40.0000 mg | ORAL_TABLET | Freq: Every day | ORAL | 3 refills | Status: DC

## 2021-10-12 ENCOUNTER — Other Ambulatory Visit: Payer: Self-pay | Admitting: Family Medicine

## 2021-10-12 DIAGNOSIS — Z1231 Encounter for screening mammogram for malignant neoplasm of breast: Secondary | ICD-10-CM

## 2021-10-20 DIAGNOSIS — Z1211 Encounter for screening for malignant neoplasm of colon: Secondary | ICD-10-CM | POA: Diagnosis not present

## 2021-10-20 DIAGNOSIS — K5904 Chronic idiopathic constipation: Secondary | ICD-10-CM | POA: Diagnosis not present

## 2021-11-09 ENCOUNTER — Ambulatory Visit: Payer: Medicare Other

## 2021-11-15 ENCOUNTER — Ambulatory Visit (INDEPENDENT_AMBULATORY_CARE_PROVIDER_SITE_OTHER): Payer: Medicare Other | Admitting: Family Medicine

## 2021-11-15 VITALS — BP 144/74 | HR 65 | Ht 65.0 in | Wt 131.6 lb

## 2021-11-15 DIAGNOSIS — H9201 Otalgia, right ear: Secondary | ICD-10-CM

## 2021-11-15 DIAGNOSIS — H9209 Otalgia, unspecified ear: Secondary | ICD-10-CM | POA: Insufficient documentation

## 2021-11-15 NOTE — Progress Notes (Signed)
    SUBJECTIVE:   CHIEF COMPLAINT / HPI:   Patient presents with right ear pain that started 2 days ago. She has not been swimming all summer, the first time she went was 3 days ago. Describes the pain coming from the inside of her ear. Denies fever, congestion, cough or other symptoms. She has allergies but otherwise feels well. Per chart review, she has a history of right eustachian tube dysfunction and has never had surgery for it. Used to have a lot of ear infections as a child but she says she grew out of it. She used to see ENT but has not in years. She wears hearing aids. Denies headache, vision changes, imbalance and dizziness. Experiencing ringing in her ears but this has been ongoing since 2011.   OBJECTIVE:   BP (!) 144/74   Pulse 65   Ht '5\' 5"'$  (1.651 m)   Wt 131 lb 9.6 oz (59.7 kg)   SpO2 99%   BMI 21.90 kg/m   General: Patient well-appearing, in no acute distress. HEENT: nonbulging TM bilaterally with very mild to no erythema noted along the right ear without erythema of the left ear, no drainage bilaterally, no pain of tragus or pinna bilaterally, no erythema noted of outer ear bilaterally, no presence of cervical LAD   Resp: normal work of breathing noted  Psych: mood appropriate, denies SI or plan   ASSESSMENT/PLAN:   Ear pain -right ear pain, no evidence of acute otitis media or otitis externa, likely secondary to eustachian tube dysfunction -reassurance provided and encouraged to clean her hearing aids more frequently, return precautions discussed    Positive question 9 PHQ-9 score of 5 with 1 for question 9 reviewed and discussed. Upon questioning, patient denies current SI and denies every having a plan in place. Currently living with brother and sister-in-law which is not going the best but she cannot go elsewhere as she is limited in her options since she has a cat. Experiencing normal stressors but denies any desire to harm herself. Reassurance provided and patient  safe to go home.     Donney Dice, [place]

## 2021-11-15 NOTE — Patient Instructions (Addendum)
It was great seeing you today!  Today we discussed your ear pain, I am not sure what is causing this as you do not have an ear infection either internally or externally. If this continues for 1-2 more weeks or worsens then please return so we can take another look.   Please make sure to clean your hearing aids frequently so that there is less contamination and this will help prevent an infection in the future.   Please follow up at your next scheduled appointment, if anything arises between now and then, please don't hesitate to contact our office.   Thank you for allowing [place] to be a part of your medical care!  Thank you, Dr. Larae Grooms

## 2021-11-15 NOTE — Assessment & Plan Note (Signed)
-  right ear pain, no evidence of acute otitis media or otitis externa, likely secondary to eustachian tube dysfunction -reassurance provided and encouraged to clean her hearing aids more frequently, return precautions discussed

## 2021-11-17 ENCOUNTER — Ambulatory Visit: Payer: Medicare Other | Admitting: Family Medicine

## 2021-12-11 ENCOUNTER — Other Ambulatory Visit: Payer: Self-pay | Admitting: Family Medicine

## 2021-12-13 ENCOUNTER — Other Ambulatory Visit: Payer: Self-pay | Admitting: Family Medicine

## 2021-12-14 ENCOUNTER — Ambulatory Visit
Admission: RE | Admit: 2021-12-14 | Discharge: 2021-12-14 | Disposition: A | Discharge status: Home / Self Care | Payer: Medicare Other | Source: Ambulatory Visit | Attending: Family Medicine | Admitting: Family Medicine

## 2021-12-14 DIAGNOSIS — Z1231 Encounter for screening mammogram for malignant neoplasm of breast: Secondary | ICD-10-CM | POA: Diagnosis not present

## 2021-12-16 NOTE — Telephone Encounter (Signed)
Pharmacy received refill and it is ready for patient to pick up.   Please decline refill request, as I am unable to deny this medication.   Thanks.   Talbot Grumbling, RN

## 2021-12-16 NOTE — Telephone Encounter (Signed)
Can you double check w/ pharmacy? I sent this rx in 3 days ago and it says pharmacy received it. Would prefer not to send again if they have it on file since it's a controlled substance  Thanks! Leeanne Rio, MD

## 2021-12-22 ENCOUNTER — Ambulatory Visit (HOSPITAL_BASED_OUTPATIENT_CLINIC_OR_DEPARTMENT_OTHER): Payer: Medicare Other | Admitting: Family Medicine

## 2021-12-22 ENCOUNTER — Encounter: Payer: Self-pay | Admitting: Family Medicine

## 2021-12-22 ENCOUNTER — Other Ambulatory Visit: Payer: Self-pay

## 2021-12-22 VITALS — BP 142/80 | HR 86 | Wt 132.0 lb

## 2021-12-22 DIAGNOSIS — I10 Essential (primary) hypertension: Secondary | ICD-10-CM | POA: Diagnosis not present

## 2021-12-22 DIAGNOSIS — Z23 Encounter for immunization: Secondary | ICD-10-CM | POA: Diagnosis not present

## 2021-12-22 DIAGNOSIS — G4700 Insomnia, unspecified: Secondary | ICD-10-CM | POA: Diagnosis not present

## 2021-12-22 MED ORDER — MIRTAZAPINE 15 MG PO TABS: 15.0000 mg | ORAL_TABLET | Freq: Every day | ORAL | 0 refills | Status: DC

## 2021-12-22 NOTE — Progress Notes (Signed)
  Date of Visit: 12/22/2021   HPI:  Marie Harvey is a 66 y.o. here for f/u:  L thumb pain Pain started 2 weeks ago. It is associated with morning stiffness, pain, and swelling. Morning stiffness resolves with stretching. She uses a thumb brace, Voltaren gel, and Aspirin with some relief. She has no history of joint pain or swelling. She denies joint warmth and redness.  Does note that her thumb tends to pop while bending and extending it.  Hypertension She takes Valsartan '40mg'$  daily. She has not taken her medication this morning yet. She does not check blood pressures at home. She denies chest pain and headaches.  Insomnia and mood Patient was sleeping 8 hours with Ambien '5mg'$  nightly. For the last 2-3 weeks she has difficulty falling asleep. She will sleep 6 hours a night and wake up fatigued. She reports increased stress with family and work. She has occasional thoughts that she would be better off dead but does not have a plan or intention of harming herself, and states she would not do this.  Patient was in therapy in the past but did not like her therapist. She is amenable to restarting therapy, also to restarting medication to help with mood.  Previously took Remeron and did well on it.  PHYSICAL EXAM: BP (!) 142/80   Pulse 86   Wt 132 lb (59.9 kg)   SpO2 98%   BMI 21.97 kg/m  Gen: Well appearing in no distress. CV: Normal rate.  Murmur present, typical of her exam. Normal S1 and S2. Pulm: Normal work of breathing. No wheezing, crackles, or rales. Neuro: Alert and oriented. Extr: Tenderness to palpation of thumb MCP and IP joints. Popping of thumb tendon noted on extension and flexion.  No joint swelling, erythema, or or warmth. Psych: normal range of affect, well groomed, speech normal in rate and volume, normal eye contact   ASSESSMENT/PLAN:  Hypertension Blood pressure elevated 164/83 and on recheck 142/80. Patient has not taken BP medication today.  -Continue Valsartan  '40mg'$  daily. -Continue to check home blood pressures. -BMP today.   Insomnia Given life stressors, insomnia and mood has worsened. PHQ9 score of 6 notable for passive thoughts she might be better off dead. She has no plan to hurt herself or end her life. -Continue Ambien '5mg'$  PRN nightly.  -Restart Remeron '15mg'$  nightly for sleep and mood. -Encouraged patient to go on [email or web address] for therapy resources.  -Discussed calling [phone] if patient has thoughts of ending her life or hurting herself.  She agreed to do this if her thoughts were to worsen.  Trigger finger of L thumb Given physical exam findings of thumb tendon popping on extension and flexion, this is most likely trigger finger. Discussed management options including glucocorticoid injection and NSAIDs. Patient opts to continue with NSAID management.   HM Flu vaccine today. She plans to get COVID vaccine a few weeks later. Patient to schedule colonoscopy in January.  Follow-up Return in month for f/u on mood, insomnia, and hypertension.  Leonette Nutting, [facility]  Patient seen along with [employer] student Leonette Nutting. I personally evaluated this patient along with the student, and verified all aspects of the history, physical exam, and medical decision making as documented by the student. I agree with the student's documentation and have made all necessary edits.  [name], MD  [place]

## 2021-12-22 NOTE — Assessment & Plan Note (Addendum)
Blood pressure elevated 164/83 and on recheck 142/80. Patient has not taken BP medication today.  -Continue Valsartan '40mg'$  daily. -Continue to check home blood pressures. -BMP today.

## 2021-12-22 NOTE — Patient Instructions (Signed)
It was great to see you again today!  Sent in remeron for mood and sleep, take at night Checking kidney function today  For information on therapists, please go to [email or web address]. You can also contact your insurance company to find an in-network therapist.    Let me know if thumb worsens and you want to try a steroid shot.  Be well, Dr. Ardelia Mems  Trigger Finger  Trigger finger, also called stenosing tenosynovitis,  is a condition that causes a finger to get stuck in a bent position. Each finger has a tendon, which is a tough, cord-like tissue that connects muscle to bone, and each tendon passes through a tunnel of tissue called a tendon sheath. To move your finger, your tendon needs to glide freely through the sheath. Trigger finger happens when the tendon or the sheath thickens, making it difficult to move your finger. Trigger finger can affect any finger or a thumb. It may affect more than one finger. Mild cases may clear up with rest and medicine. Severe cases require more treatment. What are the causes? Trigger finger is caused by a thickened finger tendon or tendon sheath. The cause of this thickening is not known. What increases the risk? The following factors may make you more likely to develop this condition: Doing activities that require a strong grip. Having rheumatoid arthritis, gout, or diabetes. Being 41-88 years old. Being female. What are the signs or symptoms? Symptoms of this condition include: Pain when bending or straightening your finger. Tenderness or swelling where your finger attaches to the palm of your hand. A lump in the palm of your hand or on the inside of your finger. Hearing a noise like a pop or a snap when you try to straighten your finger. Feeling a catching or locking sensation when you try to straighten your finger. Being unable to straighten your finger. How is this diagnosed? This condition is diagnosed based on your symptoms and a  physical exam. How is this treated? This condition may be treated by: Resting your finger and avoiding activities that make symptoms worse. Wearing a finger splint to keep your finger extended. Taking NSAIDs, such as ibuprofen, to relieve pain and swelling. Doing gentle exercises to stretch the finger as told by your health care provider. Having medicine that reduces swelling and inflammation (steroids) injected into the tendon sheath. Injections may need to be repeated. Having surgery to open the tendon sheath. This may be done if other treatments do not work and you cannot straighten your finger. You may need physical therapy after surgery. Follow these instructions at home: If you have a splint: Wear the splint as told by your health care provider. Remove it only as told by your health care provider. Loosen it if your fingers tingle, become numb, or turn cold and blue. Keep it clean. If the splint is not waterproof: Do not let it get wet. Cover it with a watertight covering when you take a bath or shower. Managing pain, stiffness, and swelling     If directed, apply heat to the affected area as often as told by your health care provider. Use the heat source that your health care provider recommends, such as a moist heat pack or a heating pad. Place a towel between your skin and the heat source. Leave the heat on for 20-30 minutes. Remove the heat if your skin turns bright red. This is especially important if you are unable to feel pain, heat, or cold. You may have a  greater risk of getting burned. If directed, put ice on the painful area. To do this: If you have a removable splint, remove it as told by your health care provider. Put ice in a plastic bag. Place a towel between your skin and the bag or between your splint and the bag. Leave the ice on for 20 minutes, 2-3 times a day.  Activity Rest your finger as told by your health care provider. Avoid activities that make the pain  worse. Return to your normal activities as told by your health care provider. Ask your health care provider what activities are safe for you. Do exercises as told by your health care provider. Ask your health care provider when it is safe to drive if you have a splint on your hand. General instructions Take over-the-counter and prescription medicines only as told by your health care provider. Keep all follow-up visits as told by your health care provider. This is important. Contact a health care provider if: Your symptoms are not improving with home care. Summary Trigger finger, also called stenosing tenosynovitis, causes your finger to get stuck in a bent position. This can make it difficult and painful to straighten your finger. This condition develops when a finger tendon or tendon sheath thickens. Treatment may include resting your finger, wearing a splint, and taking medicines. In severe cases, surgery to open the tendon sheath may be needed. This information is not intended to replace advice given to you by your health care provider. Make sure you discuss any questions you have with your health care provider. Document Revised: 07/22/2018 Document Reviewed: 07/22/2018 Elsevier Patient Education  [place].

## 2021-12-22 NOTE — Assessment & Plan Note (Addendum)
Given life stressors, insomnia and mood has worsened. PHQ9 score of 6 notable for passive thoughts she might be better off dead. She has no plan to hurt herself or end her life. -Continue Ambien '5mg'$  PRN nightly.  -Restart Remeron '15mg'$  nightly for sleep and mood. -Encouraged patient to go on [email or web address] for therapy resources.  -Discussed calling [phone] if patient has thoughts of ending her life or hurting herself.  She agreed to do this if her thoughts were to worsen.

## 2021-12-23 LAB — BASIC METABOLIC PANEL
BUN/Creatinine Ratio: 27 (ref 12–28)
BUN: 20 mg/dL (ref 8–27)
CO2: 23 mmol/L (ref 20–29)
Calcium: 9.5 mg/dL (ref 8.7–10.3)
Chloride: 102 mmol/L (ref 96–106)
Creatinine, Ser: 0.73 mg/dL (ref 0.57–1.00)
Glucose: 87 mg/dL (ref 70–99)
Potassium: 4.7 mmol/L (ref 3.5–5.2)
Sodium: 141 mmol/L (ref 134–144)
eGFR: 90 mL/min/{1.73_m2} (ref >59)

## 2022-01-10 ENCOUNTER — Other Ambulatory Visit: Payer: Self-pay | Admitting: Family Medicine

## 2022-01-13 MED ORDER — ZOLPIDEM TARTRATE 5 MG PO TABS: 5.0000 mg | ORAL_TABLET | Freq: Every evening | ORAL | 5 refills | Status: DC | PRN

## 2022-01-15 NOTE — Progress Notes (Deleted)
Cardiology Office Note:    Date:  01/15/2022   ID:  Marie, Harvey October 04, 1955, MRN [id number]  PCP:  Leeanne Rio, MD   [place]  Cardiologist:  None  Advanced Practice Provider:  No care team member to display Electrophysiologist:  None   Referring MD: Leeanne Rio, MD     History of Present Illness:    Marie Harvey is a 66 y.o. female with a hx of tetrology of fallot s/p repair 1973, GERD and depression who presents to clinic for follow-up.  Patient underwent repair in 1973 for tetralogy of fallot at [facility]. Was followed with congenital specialist for several years but had not seen a Cardiologist in 19 years. TTE 05/05/20 with LVEF 45-50%, possible small residual VSD, moderately enlarged RV with preserved RV function, d-shaped septum mild PS with mean gradient 60mHg, peak gradient 179mg, severe PR; mild TR. Severely enlarged LA and RA, and hyperechoic structure seen at the apex of RV. Mildly dilated ascending aorta 3628m She subsequently underwent cardiac MR on 07/11/20 which demonstrated mild-to-moderate AR, severe PR, moderate PS, ASD, perimembranous VSD, Qp:Qs 1.37, LVEF 50%, mild RV enlargement with preserved RV systolic function, no RV thrombus, moderate RAE, mild ascending aortia dilation.  Saw Dr. FleRaul Del 06/10/20 where a Holter monitor was placed for palpitations. Planned for continued monitoring of her PR given relative lack of symptoms.  Returned to visit Dr. FleRaul Del 07/14/21 where she was doing well. She remained active without significant symptoms. Planned for continued watchful waiting.   Today, the patient states that she occasionally has trouble sleeping, but is otherwise feeling well. Sometimes she will feel her heart skip a beat. This is not bothersome for her.  She presents a BP log showing well controlled readings at home. In clinic she has a BP of 110/80.  Since her last visit she was on a trip to  [place] was walking frequently without significant exertional symptoms. Currently she continues to exercise routinely with no anginal symptoms.  She denies any chest pain, shortness of breath, or peripheral edema. No lightheadedness, headaches, syncope, orthopnea, or PND.  After discussion, she would prefer to defer any valve repair procedures. She will continue to monitor for worsening symptoms.  Past Medical History:  Diagnosis Date   Abnormal RBC    h/o. f/u by PCP   Endometrial hyperplasia without atypia, complex 02/2000   GERD (gastroesophageal reflux disease)    H/O cleft lip    Palate   H/O major depression    H/O tetralogy of Fallot repair 1973   Heart murmur    As a child   Osteoporosis     Past Surgical History:  Procedure Laterality Date   ABDOMINAL SURGERY     CLEFT LIP REPAIR     and palate as child   HERNIA REPAIR     TETRALOGY OF FALLOT REPAIR  1973    Current Medications: No outpatient medications have been marked as taking for the 01/18/22 encounter (Appointment) with PemFreada BergeronD.     Allergies:   Codeine, Calcium-containing compounds, Penicillins, and Vitamin d analogs   Social History   Socioeconomic History   Marital status: Single    Spouse name: Not on file   Number of children: 0   Years of education: 12   Highest education level: 12th grade  Occupational History   Occupation: CNA    Comment: [employer]  Tobacco Use   Smoking status:  Never    Passive exposure: Never   Smokeless tobacco: Never  Vaping Use   Vaping Use: Never used  Substance and Sexual Activity   Alcohol use: No   Drug use: No   Sexual activity: Not Currently    Birth control/protection: None  Other Topics Concern   Not on file  Social History Narrative   Patient lives with her brother and his wife.    Patient has never been married.   No children.   CNA at [employer].   [employer].   Enjoys readying.   Patient goes to the gym 3x per week-  strength training and cardio on treadmill.    Social Determinants of Health   Financial Resource Strain: Low Risk  (06/01/2021)   Overall Financial Resource Strain (CARDIA)    Difficulty of Paying Living Expenses: Not hard at all  Food Insecurity: No Food Insecurity (06/01/2021)   Hunger Vital Sign    Worried About Running Out of Food in the Last Year: Never true    Ran Out of Food in the Last Year: Never true  Transportation Needs: No Transportation Needs (06/01/2021)   PRAPARE - Hydrologist (Medical): No    Lack of Transportation (Non-Medical): No  Physical Activity: Insufficiently Active (06/01/2021)   Exercise Vital Sign    Days of Exercise per Week: 3 days    Minutes of Exercise per Session: 30 min  Stress: No Stress Concern Present (06/01/2021)   [place]    Feeling of Stress : Not at all  Social Connections: Moderately Isolated (06/01/2021)   Social Connection and Isolation Panel [NHANES]    Frequency of Communication with Friends and Family: Twice a week    Frequency of Social Gatherings with Friends and Family: More than three times a week    Attends Religious Services: More than 4 times per year    Active Member of [employer] or Organizations: No    Attends Archivist Meetings: Never    Marital Status: Never married     Family History: The patient's family history includes Breast cancer (age of onset: 37) in her maternal aunt; Colon cancer in her mother; Stroke in her father; Uterine cancer in her mother. There is no history of Esophageal cancer.  ROS:   Please see the history of present illness.    Review of Systems  Constitutional:  Negative for chills and fever.  HENT:  Negative for hearing loss and sore throat.   Eyes:  Negative for blurred vision, double vision and redness.  Respiratory:  Negative for shortness of breath.   Cardiovascular:  Positive for  palpitations. Negative for chest pain, orthopnea, claudication, leg swelling and PND.  Gastrointestinal:  Positive for heartburn. Negative for blood in stool, melena, nausea and vomiting.  Genitourinary:  Negative for dysuria and flank pain.  Musculoskeletal:  Negative for falls and myalgias.  Neurological:  Negative for dizziness and loss of consciousness.  Endo/Heme/Allergies:  Negative for polydipsia.  Psychiatric/Behavioral:  Negative for substance abuse. The patient has insomnia.     EKGs/Labs/Other Studies Reviewed:    The following studies were reviewed today:  Holter Monitor 10/2020: Holter 11/08/2020: *The observed rhythms are sinus bradycardia to sinus tachycardia. *The Maximum Heart Rate recorded was 138 bpm, Day 3 / 03:31:00 pm, the Minimum Heart Rate recorded was 47 bpm, Day 3 / 04:57:44 am and the Average Heart Rate was 80 bpm. *There were 356  PVCs with a burden of 0.1 %. *There were [phone] PSVCs with a burden of 6.85 %. There were 32 occurrences of Supraventricular Tachycardia with the longest episode 5 beats, Day 3 / 09:51:38 am and the fastest episode 124 bpm, Day 2 / 02:52:27 pm. *There were 0 Patient triggered events. Occasional supraventricular and ventricular ectopy with no sustained arrhythmias Normal heart rate range and variability  Cardiac MRI 07/08/20: FINDINGS: 1. Small left ventricular size, with LVEDD 50 mm, but LVEDVI 44.39 ml/m2.   Normal left ventricular thickness, with intraventricular septal thickness of 8 mm and posterior wall thickness of 7 mm.   Mild left ventricular systolic dysfunction (LVEF =50%). There are no regional wall motion abnormalities but global hypokinesis.   There is no late gadolinium enhancement in the left ventricular myocardium.   2. Mild right ventricular enlargement with RVEDVI 104 mL/m2.   RVEDV: * : 168.20 ml   RVESV: * : 84.05 ml   RVSV: * : 84.15 ml   RVEF: * : 50.03 %   RVCO: * : 5103.33 ml/min    RVCI: * : 3.16 l/min/m   HR: * : 60.6/min   Normal right ventricular thickness.   Normal right ventricular systolic function (RVEF =62%). There are no regional wall motion abnormalities or aneurysms.   There is no evidence of RV thrombus.   3. Normal left atrial size and moderate right atrial enlargement, with LAESV 37 mL/m2 and RAESV 53 mL/m2.   4. Normal size of the aortic root and main, left, and right pulmonary arteries. Ascending aorta measures 38 mm; mild dilation for age and BSA.   5.  Abnormalities as below:   Tricuspid Valve: Qualitatively trivial regurgitation   Mitral Valve: Qualitatively trivial regurgitation   Aortic Valve: Mild-to-moderate aortic regurgitation, regurgitation fraction 35%   Pulmonic Valve: Severe pulmonic regurgitation, regurgitation fraction 60%, valve thickening noted, maximum velocity 3.29 m/s consistent with moderate pulmonic stenosis   Atrial septal defects: There is a left atrial to right atrial communication with velocity decoding from left to right consistent with an ASD. This is best seen on the four chamber stack series.   VSD: There is evidence of an perimembranous VSD with velocity decoding consistent with residual left to right flow.   Qp/Qs: 1.37   6.  Normal pericardium.  Trivial pericardial effusion.   7. Grossly, no extracardiac findings; evidence of prior sternotomy noted. Recommended dedicated study if concerned for non-cardiac pathology.   8.  Breathhold artifact noted.   IMPRESSION: 1. Small left ventricular size.   2. Mild left ventricular systolic dysfunction (LVEF =50%).   3. Mild right ventricular enlargement with RVEDVI 104 mL/m2.   4. Normal right ventricular systolic function (RVEF =83%).   5. There is no evidence of RV thrombus.   6. Moderate right atrial enlargement   7. Ascending aorta measures 38 mm; mild dilation for age and BSA.   8. Mild-to-moderate aortic regurgitation.   9. Severe  pulmonic regurgitation with regurgitation fraction 60% and valve thickening noted. Moderate pulmonic stenosis   10. Atrial septal defect incidentally noted.   11.  Perimembranous VSD patch with residual flow   12. Trivial pericardial effusion.  TTE 05/05/20: 1. Patient is s/p repair of Tetrology of Fallot.   2. The patient is s/p VSD patch repair. Suspect a small residual,  restrictive VSD in the the membranous poriton of the septum with L-->R  shunting (best seen on clip 15).   3. The pulmonic valve appears thickened with at  least moderately  decreased leaflet excursion. There is mild pulmonic stenosis with mean  gradient 45mHg, peak gradient 115mg. There is severe pulmonic valve  regurgitation.   4. The right ventricular size is moderately enlarged with preserved RV  systolic function. There is normal pulmonary artery systolic pressure.   5. There is a hyperechoic structure seen at the apex of the RV. May  represent trabeculations, however, thrombus is on the differential.  Fotunately, the RV systolic funciton appears preserved. Recommend definity  contrast vs TEE for further evaluation.   6. Left ventricular ejection fraction, by estimation, is 45 to 50%. The  left ventricle has mildly decreased function. The left ventricle  demonstrates global hypokinesis.   7. The interventricular septum is flattened in diastole ('D' shaped left  ventricle), consistent with right ventricular volume overload.   8. Left atrial size was severely dilated.   9. Right atrial size was severely dilated.  10. The mitral valve is grossly normal. Trivial mitral valve  regurgitation.  11. The tricuspid valve is mildly thickened. There is mild tricuspid  regurgitation  12. The aortic valve is tricuspid. There is mild calcification of the  aortic valve. There is mild thickening of the aortic valve. Aortic valve  regurgitation is mild. Mild aortic valve sclerosis is present, with no  evidence of aortic  valve stenosis.  13. Aortic dilatation noted. There is mild dilatation of the ascending  aorta, measuring 36 mm.  14. The inferior vena cava is normal in size with greater than 50%  respiratory variabilty, suggesting right atrial pressure of 3 mmHg.  15. Consider TEE for further evaluation of hyperechoic structure seen in  RV apex and further evaluation of the pulmonic valve.   Comparison(s): Compared to prior echo report in 2013, the pulmonic valve  regurgitation now appears severe with moderate dilation of the RV. There  is evidence of RA/RV pressure overload. The LVEF now appears to be around  50%. The patch repaired VSD is  visualized. There is a small, residual membranous VSD suspected. Rest of  the changes in detailed report as above.     EKG:  EKG is personally reviewed. 07/20/2021: Sinus rhythm. Rate 61 bpm.   Recent Labs: 12/22/2021: BUN 20; Creatinine, Ser 0.73; Potassium 4.7; Sodium 141   Recent Lipid Panel    Component Value Date/Time   CHOL 148 07/20/2021 1051   TRIG 52 07/20/2021 1051   HDL 60 07/20/2021 1051   CHOLHDL 2.5 07/20/2021 1051   CHOLHDL 2.6 05/25/2014 0843   VLDL 13 05/25/2014 0843   LDLCALC 77 07/20/2021 1051     Physical Exam:    VS:  There were no vitals taken for this visit.    Wt Readings from Last 3 Encounters:  12/22/21 132 lb (59.9 kg)  11/15/21 131 lb 9.6 oz (59.7 kg)  07/20/21 126 lb 12.8 oz (57.5 kg)     GEN:  Well nourished, well developed in no acute distress HEENT: Normal NECK: No JVD; No carotid bruits LYMPHATICS: No lymphadenopathy CARDIAC: RRR, 2/6 systolic murmur and diastolic murmur. No rubs, gallops RESPIRATORY:  Clear to auscultation without rales, wheezing or rhonchi  ABDOMEN: Soft, non-tender, non-distended MUSCULOSKELETAL:  No edema; No deformity  SKIN: Warm and dry NEUROLOGIC:  Alert and oriented x 3 PSYCHIATRIC:  Normal affect   ASSESSMENT:    No diagnosis found.   PLAN:    In order of problems listed  above:  #Tetrology of Fallot s/p Repair: #Moderate RV enlargement: #Severe PR, moderate PS  Cardiac MRI with LVEF 50%, mild RV enlargement with preserved systolic function, severe PR, moderate PS, ASD, small VSD, mild-to-moderate AI. Has been seen by Dr. Raul Del who recommended continued surveillance of PR as the patient is relatively asymptomatic with no significant HF symptoms or exercise limitations.  -Followed by Dr. Raul Del -Continued surveillance of PR/PS given relative lack of symptoms  #Palpitations: Cardiac monitor 10/2020 with occasional supraventricular and ventricular ectopy but no sustained arrhythmias. Has occasional palpitations but no significant symptoms.  #HTN: Better controlled running mainly 120s at home. -Continue valsartan '40mg'$  daily -Counseled about low Na diet  #HLD: -Continue crestor '5mg'$  daily -Repeat lipids   Follow-up: 1 year.    Medication Adjustments/Labs and Tests Ordered: Current medicines are reviewed at length with the patient today.  Concerns regarding medicines are outlined above.   No orders of the defined types were placed in this encounter.  No orders of the defined types were placed in this encounter.  There are no Patient Instructions on file for this visit.   I,Mathew Stumpf,acting as a Education administrator for Freada Bergeron, MD.,have documented all relevant documentation on the behalf of Freada Bergeron, MD,as directed by  Freada Bergeron, MD while in the presence of Freada Bergeron, MD.  I, Freada Bergeron, MD, have reviewed all documentation for this visit. The documentation on 01/15/22 for the exam, diagnosis, procedures, and orders are all accurate and complete.    Signed, Freada Bergeron, MD  01/15/2022 8:44 PM    [place]

## 2022-01-18 ENCOUNTER — Ambulatory Visit: Payer: Medicare Other | Admitting: Student

## 2022-01-18 ENCOUNTER — Ambulatory Visit: Payer: Medicare Other | Admitting: Cardiology

## 2022-02-02 ENCOUNTER — Ambulatory Visit (INDEPENDENT_AMBULATORY_CARE_PROVIDER_SITE_OTHER): Payer: Medicare Other | Admitting: Family Medicine

## 2022-02-02 ENCOUNTER — Encounter: Payer: Self-pay | Admitting: Family Medicine

## 2022-02-02 VITALS — BP 136/80 | HR 84 | Ht 65.0 in | Wt 135.2 lb

## 2022-02-02 DIAGNOSIS — I10 Essential (primary) hypertension: Secondary | ICD-10-CM | POA: Diagnosis not present

## 2022-02-02 DIAGNOSIS — M65312 Trigger thumb, left thumb: Secondary | ICD-10-CM | POA: Diagnosis not present

## 2022-02-02 DIAGNOSIS — G4700 Insomnia, unspecified: Secondary | ICD-10-CM | POA: Diagnosis not present

## 2022-02-02 DIAGNOSIS — R4589 Other symptoms and signs involving emotional state: Secondary | ICD-10-CM | POA: Diagnosis not present

## 2022-02-02 MED ORDER — MIRTAZAPINE 30 MG PO TABS: 30.0000 mg | ORAL_TABLET | Freq: Every day | ORAL | 0 refills | Status: DC

## 2022-02-02 NOTE — Assessment & Plan Note (Signed)
Improved with addition of Remeron.  Increase dose to 30 mg at night.  Follow-up in 3 months, sooner if needed.

## 2022-02-02 NOTE — Assessment & Plan Note (Signed)
Discussed with patient she cannot take more than prescribed of the Ambien.  Increase Remeron to 30 mg at bedtime, suspect this will continue to help with mood and with sleep.  Follow-up in 3 months.

## 2022-02-02 NOTE — Patient Instructions (Signed)
It was great to see you again today!  Increase remeron to '30mg'$  at night Follow up with me in 3 months, sooner if needed  Be well, Dr. Ardelia Mems

## 2022-02-02 NOTE — Assessment & Plan Note (Signed)
Well-controlled, continue current regimen 

## 2022-02-02 NOTE — Progress Notes (Signed)
  Date of Visit: 02/02/2022   SUBJECTIVE:   HPI:  Marie "[name]" presents today for routine follow up.  Hypertension: Currently taking valsartan 40 mg daily.  She is tolerating this well.  Insomnia: Currently taking Ambien 5 mg nightly and Remeron 15 mg at night.  She does admit to occasionally taking 2 doses of the Ambien in order to get some sleep.  She does note that the Remeron has helped significantly with her sleep and mood.  Mood: Previously was feeling very down and had some passive suicidal thoughts, and we added Remeron 15 mg at bedtime last visit.  She reports mood is doing much better, things do not bother her quite as much now.  She denies any thoughts of harming herself or others.  Is happy with the Remeron and is amenable to increasing the dose today.  Trigger thumb: Has been wearing a splint, also got some BIOflex supplements and has been taking those.  Does not want to pursue injection today.   OBJECTIVE:   BP 136/80   Pulse 84   Ht '5\' 5"'$  (1.651 m)   Wt 135 lb 3.2 oz (61.3 kg)   SpO2 96%   BMI 22.50 kg/m  Gen: no acute distress, pleasant, cooperative HEENT: normocephalic, atraumatic  Heart: regular rate and rhythm  Lungs: clear to auscultation bilaterally, normal work of breathing  Neuro: alert, grossly nonfocal Ext: No appreciable lower extremity edema bilaterally  Psych: normal range of affect, well groomed, speech normal in rate and volume, normal eye contact   ASSESSMENT/PLAN:   Health maintenance:  -Has colonoscopy scheduled February 21 through [facility] digestive specialists  Insomnia Discussed with patient she cannot take more than prescribed of the Ambien.  Increase Remeron to 30 mg at bedtime, suspect this will continue to help with mood and with sleep.  Follow-up in 3 months.  Hypertension Well-controlled, continue current regimen.  Depressed mood Improved with addition of Remeron.  Increase dose to 30 mg at night.  Follow-up in 3 months,  sooner if needed.  Trigger thumb Discussed option of seeing hand specialist for injection.  She prefers to continue with conservative management at this time.  Follow-up if not improving.  FOLLOW UP: Follow up in 3 months  for above issues  [place] J. Ardelia Mems, [place]

## 2022-02-14 NOTE — Progress Notes (Deleted)
Cardiology Office Note:    Date:  02/14/2022   ID:  Marie Harvey, DOB Oct 20, 1955, MRN [id number]  PCP:  Leeanne Rio, MD   [place]  Cardiologist:  None  Advanced Practice Provider:  No care team member to display Electrophysiologist:  None   Referring MD: Leeanne Rio, MD     History of Present Illness:    Marie Harvey is a 66 y.o. female with a hx of tetrology of fallot s/p repair 1973, GERD and depression who presents to clinic for follow-up.  Patient underwent repair in 1973 for tetralogy of fallot at [facility]. Was followed with congenital specialist for several years but had not seen a Cardiologist in 36 years. TTE 05/05/20 with LVEF 45-50%, possible small residual VSD, moderately enlarged RV with preserved RV function, d-shaped septum mild PS with mean gradient 54mHg, peak gradient 128mg, severe PR; mild TR. Severely enlarged LA and RA, and hyperechoic structure seen at the apex of RV. Mildly dilated ascending aorta 3663m She subsequently underwent cardiac MR on 07/11/20 which demonstrated mild-to-moderate AR, severe PR, moderate PS, ASD, perimembranous VSD, Qp:Qs 1.37, LVEF 50%, mild RV enlargement with preserved RV systolic function, no RV thrombus, moderate RAE, mild ascending aortia dilation.  Saw Dr. FleRaul Del 06/10/20 where a Holter monitor was placed for palpitations. Planned for continued monitoring of her PR given relative lack of symptoms.  Returned to visit Dr. FleRaul Del 07/14/21 where she was doing well. She remained active without significant symptoms. Planned for continued watchful waiting.   Was last seen in clinic 07/2021 where she was doing well from a CV standpoint.   Today, ***  Past Medical History:  Diagnosis Date   Abnormal RBC    h/o. f/u by PCP   Endometrial hyperplasia without atypia, complex 02/2000   GERD (gastroesophageal reflux disease)    H/O cleft lip    Palate   H/O major depression     H/O tetralogy of Fallot repair 1973   Heart murmur    As a child   Osteoporosis     Past Surgical History:  Procedure Laterality Date   ABDOMINAL SURGERY     CLEFT LIP REPAIR     and palate as child   HERNIA REPAIR     TETRALOGY OF FALLOT REPAIR  1973    Current Medications: No outpatient medications have been marked as taking for the 02/15/22 encounter (Appointment) with PemFreada BergeronD.     Allergies:   Codeine, Calcium-containing compounds, Penicillins, and Vitamin d analogs   Social History   Socioeconomic History   Marital status: Single    Spouse name: Not on file   Number of children: 0   Years of education: 12   Highest education level: 12th grade  Occupational History   Occupation: CNA    Comment: [employer]  Tobacco Use   Smoking status: Never    Passive exposure: Never   Smokeless tobacco: Never  Vaping Use   Vaping Use: Never used  Substance and Sexual Activity   Alcohol use: No   Drug use: No   Sexual activity: Not Currently    Birth control/protection: None  Other Topics Concern   Not on file  Social History Narrative   Patient lives with her brother and his wife.    Patient has never been married.   No children.   CNA at [employer].   Enjoys readying.   Patient  goes to the gym 3x per week- strength training and cardio on treadmill.    Social Determinants of Health   Financial Resource Strain: Low Risk  (06/01/2021)   Overall Financial Resource Strain (CARDIA)    Difficulty of Paying Living Expenses: Not hard at all  Food Insecurity: No Food Insecurity (06/01/2021)   Hunger Vital Sign    Worried About Running Out of Food in the Last Year: Never true    Ran Out of Food in the Last Year: Never true  Transportation Needs: No Transportation Needs (06/01/2021)   PRAPARE - Hydrologist (Medical): No    Lack of Transportation (Non-Medical): No  Physical Activity: Insufficiently Active  (06/01/2021)   Exercise Vital Sign    Days of Exercise per Week: 3 days    Minutes of Exercise per Session: 30 min  Stress: No Stress Concern Present (06/01/2021)   [name]    Feeling of Stress : Not at all  Social Connections: Moderately Isolated (06/01/2021)   Social Connection and Isolation Panel [NHANES]    Frequency of Communication with Friends and Family: Twice a week    Frequency of Social Gatherings with Friends and Family: More than three times a week    Attends Religious Services: More than 4 times per year    Active Member of [employer] or Organizations: No    Attends Archivist Meetings: Never    Marital Status: Never married     Family History: The patient's family history includes Breast cancer (age of onset: 33) in her maternal aunt; Colon cancer in her mother; Stroke in her father; Uterine cancer in her mother. There is no history of Esophageal cancer.  ROS:   Please see the history of present illness.    Review of Systems  Constitutional:  Negative for chills and fever.  HENT:  Negative for hearing loss and sore throat.   Eyes:  Negative for blurred vision, double vision and redness.  Respiratory:  Negative for shortness of breath.   Cardiovascular:  Positive for palpitations. Negative for chest pain, orthopnea, claudication, leg swelling and PND.  Gastrointestinal:  Positive for heartburn. Negative for blood in stool, melena, nausea and vomiting.  Genitourinary:  Negative for dysuria and flank pain.  Musculoskeletal:  Negative for falls and myalgias.  Neurological:  Negative for dizziness and loss of consciousness.  Endo/Heme/Allergies:  Negative for polydipsia.  Psychiatric/Behavioral:  Negative for substance abuse. The patient has insomnia.     EKGs/Labs/Other Studies Reviewed:    The following studies were reviewed today:  Holter Monitor 10/2020: Holter 11/08/2020: *The observed  rhythms are sinus bradycardia to sinus tachycardia. *The Maximum Heart Rate recorded was 138 bpm, Day 3 / 03:31:00 pm, the Minimum Heart Rate recorded was 47 bpm, Day 3 / 04:57:44 am and the Average Heart Rate was 80 bpm. *There were 356 PVCs with a burden of 0.1 %. *There were [id number] PSVCs with a burden of 6.85 %. There were 32 occurrences of Supraventricular Tachycardia with the longest episode 5 beats, Day 3 / 09:51:38 am and the fastest episode 124 bpm, Day 2 / 02:52:27 pm. *There were 0 Patient triggered events. Occasional supraventricular and ventricular ectopy with no sustained arrhythmias Normal heart rate range and variability  Cardiac MRI 07/08/20: FINDINGS: 1. Small left ventricular size, with LVEDD 50 mm, but LVEDVI 44.39 ml/m2.   Normal left ventricular thickness, with intraventricular septal thickness of 8 mm and posterior  wall thickness of 7 mm.   Mild left ventricular systolic dysfunction (LVEF =50%). There are no regional wall motion abnormalities but global hypokinesis.   There is no late gadolinium enhancement in the left ventricular myocardium.   2. Mild right ventricular enlargement with RVEDVI 104 mL/m2.   RVEDV: * : 168.20 ml   RVESV: * : 84.05 ml   RVSV: * : 84.15 ml   RVEF: * : 50.03 %   RVCO: * : 5103.33 ml/min   RVCI: * : 3.16 l/min/m   HR: * : 60.6/min   Normal right ventricular thickness.   Normal right ventricular systolic function (RVEF =09%). There are no regional wall motion abnormalities or aneurysms.   There is no evidence of RV thrombus.   3. Normal left atrial size and moderate right atrial enlargement, with LAESV 37 mL/m2 and RAESV 53 mL/m2.   4. Normal size of the aortic root and main, left, and right pulmonary arteries. Ascending aorta measures 38 mm; mild dilation for age and BSA.   5.  Abnormalities as below:   Tricuspid Valve: Qualitatively trivial regurgitation   Mitral Valve: Qualitatively trivial  regurgitation   Aortic Valve: Mild-to-moderate aortic regurgitation, regurgitation fraction 35%   Pulmonic Valve: Severe pulmonic regurgitation, regurgitation fraction 60%, valve thickening noted, maximum velocity 3.29 m/s consistent with moderate pulmonic stenosis   Atrial septal defects: There is a left atrial to right atrial communication with velocity decoding from left to right consistent with an ASD. This is best seen on the four chamber stack series.   VSD: There is evidence of an perimembranous VSD with velocity decoding consistent with residual left to right flow.   Qp/Qs: 1.37   6.  Normal pericardium.  Trivial pericardial effusion.   7. Grossly, no extracardiac findings; evidence of prior sternotomy noted. Recommended dedicated study if concerned for non-cardiac pathology.   8.  Breathhold artifact noted.   IMPRESSION: 1. Small left ventricular size.   2. Mild left ventricular systolic dysfunction (LVEF =50%).   3. Mild right ventricular enlargement with RVEDVI 104 mL/m2.   4. Normal right ventricular systolic function (RVEF =98%).   5. There is no evidence of RV thrombus.   6. Moderate right atrial enlargement   7. Ascending aorta measures 38 mm; mild dilation for age and BSA.   8. Mild-to-moderate aortic regurgitation.   9. Severe pulmonic regurgitation with regurgitation fraction 60% and valve thickening noted. Moderate pulmonic stenosis   10. Atrial septal defect incidentally noted.   11.  Perimembranous VSD patch with residual flow   12. Trivial pericardial effusion.  TTE 05/05/20: 1. Patient is s/p repair of Tetrology of Fallot.   2. The patient is s/p VSD patch repair. Suspect a small residual,  restrictive VSD in the the membranous poriton of the septum with L-->R  shunting (best seen on clip 15).   3. The pulmonic valve appears thickened with at least moderately  decreased leaflet excursion. There is mild pulmonic stenosis with mean   gradient 45mHg, peak gradient 137mg. There is severe pulmonic valve  regurgitation.   4. The right ventricular size is moderately enlarged with preserved RV  systolic function. There is normal pulmonary artery systolic pressure.   5. There is a hyperechoic structure seen at the apex of the RV. May  represent trabeculations, however, thrombus is on the differential.  Fotunately, the RV systolic funciton appears preserved. Recommend definity  contrast vs TEE for further evaluation.   6. Left ventricular ejection fraction, by estimation,  is 45 to 50%. The  left ventricle has mildly decreased function. The left ventricle  demonstrates global hypokinesis.   7. The interventricular septum is flattened in diastole ('D' shaped left  ventricle), consistent with right ventricular volume overload.   8. Left atrial size was severely dilated.   9. Right atrial size was severely dilated.  10. The mitral valve is grossly normal. Trivial mitral valve  regurgitation.  11. The tricuspid valve is mildly thickened. There is mild tricuspid  regurgitation  12. The aortic valve is tricuspid. There is mild calcification of the  aortic valve. There is mild thickening of the aortic valve. Aortic valve  regurgitation is mild. Mild aortic valve sclerosis is present, with no  evidence of aortic valve stenosis.  13. Aortic dilatation noted. There is mild dilatation of the ascending  aorta, measuring 36 mm.  14. The inferior vena cava is normal in size with greater than 50%  respiratory variabilty, suggesting right atrial pressure of 3 mmHg.  15. Consider TEE for further evaluation of hyperechoic structure seen in  RV apex and further evaluation of the pulmonic valve.   Comparison(s): Compared to prior echo report in 2013, the pulmonic valve  regurgitation now appears severe with moderate dilation of the RV. There  is evidence of RA/RV pressure overload. The LVEF now appears to be around  50%. The patch  repaired VSD is  visualized. There is a small, residual membranous VSD suspected. Rest of  the changes in detailed report as above.     EKG:  EKG is personally reviewed. 07/20/2021: Sinus rhythm. Rate 61 bpm.   Recent Labs: 12/22/2021: BUN 20; Creatinine, Ser 0.73; Potassium 4.7; Sodium 141   Recent Lipid Panel    Component Value Date/Time   CHOL 148 07/20/2021 1051   TRIG 52 07/20/2021 1051   HDL 60 07/20/2021 1051   CHOLHDL 2.5 07/20/2021 1051   CHOLHDL 2.6 05/25/2014 0843   VLDL 13 05/25/2014 0843   LDLCALC 77 07/20/2021 1051     Physical Exam:    VS:  There were no vitals taken for this visit.    Wt Readings from Last 3 Encounters:  02/02/22 135 lb 3.2 oz (61.3 kg)  12/22/21 132 lb (59.9 kg)  11/15/21 131 lb 9.6 oz (59.7 kg)     GEN:  Well nourished, well developed in no acute distress HEENT: Normal NECK: No JVD; No carotid bruits LYMPHATICS: No lymphadenopathy CARDIAC: RRR, 2/6 systolic murmur and diastolic murmur. No rubs, gallops RESPIRATORY:  Clear to auscultation without rales, wheezing or rhonchi  ABDOMEN: Soft, non-tender, non-distended MUSCULOSKELETAL:  No edema; No deformity  SKIN: Warm and dry NEUROLOGIC:  Alert and oriented x 3 PSYCHIATRIC:  Normal affect   ASSESSMENT:    No diagnosis found.   PLAN:    In order of problems listed above:  #Tetrology of Fallot s/p Repair: #Moderate RV enlargement: #Severe PR, moderate PS Cardiac MRI with LVEF 50%, mild RV enlargement with preserved systolic function, severe PR, moderate PS, ASD, small VSD, mild-to-moderate AI. Has been seen by Dr. Raul Del who recommended continued surveillance of PR as the patient is relatively asymptomatic with no significant HF symptoms or exercise limitations.  -Followed by Dr. Raul Del -Continued surveillance of PR/PS given relative lack of symptoms  #Palpitations: Cardiac monitor 10/2020 with occasional supraventricular and ventricular ectopy but no sustained arrhythmias.  Has occasional palpitations but no significant symptoms.  #HTN: Better controlled running mainly 120s at home. -Continue valsartan '40mg'$  daily -Counseled about low Na  diet  #HLD: -Continue crestor '5mg'$  daily -LDL controlled at 77   Follow-up: 1 year.    Medication Adjustments/Labs and Tests Ordered: Current medicines are reviewed at length with the patient today.  Concerns regarding medicines are outlined above.   No orders of the defined types were placed in this encounter.  No orders of the defined types were placed in this encounter.  There are no Patient Instructions on file for this visit.   I,Mathew Stumpf,acting as a Education administrator for Freada Bergeron, MD.,have documented all relevant documentation on the behalf of Freada Bergeron, MD,as directed by  Freada Bergeron, MD while in the presence of Freada Bergeron, MD.  I, Freada Bergeron, MD, have reviewed all documentation for this visit. The documentation on 02/14/22 for the exam, diagnosis, procedures, and orders are all accurate and complete.    Signed, Freada Bergeron, MD  02/14/2022 1:24 PM    [facility]

## 2022-02-15 ENCOUNTER — Ambulatory Visit: Payer: Medicare Other | Admitting: Cardiology

## 2022-04-03 NOTE — Progress Notes (Deleted)
Cardiology Office Note:    Date:  04/03/2022   ID:  Marie Harvey, DOB 10/22/1955, MRN [id number]  PCP:  Leeanne Rio, MD   [place]  Cardiologist:  None  Advanced Practice Provider:  No care team member to display Electrophysiologist:  None   Referring MD: Leeanne Rio, MD     History of Present Illness:    Marie Harvey is a 67 y.o. female with a hx of tetrology of fallot s/p repair 1973, GERD and depression who presents to clinic for follow-up.  Patient underwent repair in 1973 for tetralogy of fallot at [facility]. Was followed with congenital specialist for several years but had not seen a Cardiologist in 28 years. TTE 05/05/20 with LVEF 45-50%, possible small residual VSD, moderately enlarged RV with preserved RV function, d-shaped septum mild PS with mean gradient 38mHg, peak gradient 126mg, severe PR; mild TR. Severely enlarged LA and RA, and hyperechoic structure seen at the apex of RV. Mildly dilated ascending aorta 3619m She subsequently underwent cardiac MR on 07/11/20 which demonstrated mild-to-moderate AR, severe PR, moderate PS, ASD, perimembranous VSD, Qp:Qs 1.37, LVEF 50%, mild RV enlargement with preserved RV systolic function, no RV thrombus, moderate RAE, mild ascending aortia dilation.  Saw Dr. FleRaul Del 06/10/20 where a Holter monitor was placed for palpitations. Planned for continued monitoring of her PR given relative lack of symptoms.  Returned to visit Dr. FleRaul Del 07/14/21 where she was doing well. She remained active without significant symptoms. Planned for continued watchful waiting.   Was last seen in clinic on 07/2021 where she was stable from a CV standpoint.   Today, ***    Past Medical History:  Diagnosis Date   Abnormal RBC    h/o. f/u by PCP   Endometrial hyperplasia without atypia, complex 02/2000   GERD (gastroesophageal reflux disease)    H/O cleft lip    Palate   H/O major depression     H/O tetralogy of Fallot repair 1973   Heart murmur    As a child   Osteoporosis     Past Surgical History:  Procedure Laterality Date   ABDOMINAL SURGERY     CLEFT LIP REPAIR     and palate as child   HERNIA REPAIR     TETRALOGY OF FALLOT REPAIR  1973    Current Medications: No outpatient medications have been marked as taking for the 04/05/22 encounter (Appointment) with PemFreada BergeronD.     Allergies:   Codeine, Calcium-containing compounds, Penicillins, and Vitamin d analogs   Social History   Socioeconomic History   Marital status: Single    Spouse name: Not on file   Number of children: 0   Years of education: 12   Highest education level: 12th grade  Occupational History   Occupation: CNA    Comment: [employer]  Tobacco Use   Smoking status: Never    Passive exposure: Never   Smokeless tobacco: Never  Vaping Use   Vaping Use: Never used  Substance and Sexual Activity   Alcohol use: No   Drug use: No   Sexual activity: Not Currently    Birth control/protection: None  Other Topics Concern   Not on file  Social History Narrative   Patient lives with her brother and his wife.    Patient has never been married.   No children.   CNA at [employer].   Enjoys readying.  Patient goes to the gym 3x per week- strength training and cardio on treadmill.    Social Determinants of Health   Financial Resource Strain: Low Risk  (06/01/2021)   Overall Financial Resource Strain (CARDIA)    Difficulty of Paying Living Expenses: Not hard at all  Food Insecurity: No Food Insecurity (06/01/2021)   Hunger Vital Sign    Worried About Running Out of Food in the Last Year: Never true    Ran Out of Food in the Last Year: Never true  Transportation Needs: No Transportation Needs (06/01/2021)   PRAPARE - Hydrologist (Medical): No    Lack of Transportation (Non-Medical): No  Physical Activity: Insufficiently Active  (06/01/2021)   Exercise Vital Sign    Days of Exercise per Week: 3 days    Minutes of Exercise per Session: 30 min  Stress: No Stress Concern Present (06/01/2021)   [place]    Feeling of Stress : Not at all  Social Connections: Moderately Isolated (06/01/2021)   Social Connection and Isolation Panel [NHANES]    Frequency of Communication with Friends and Family: Twice a week    Frequency of Social Gatherings with Friends and Family: More than three times a week    Attends Religious Services: More than 4 times per year    Active Member of [employer] or Organizations: No    Attends Archivist Meetings: Never    Marital Status: Never married     Family History: The patient's family history includes Breast cancer (age of onset: 76) in her maternal aunt; Colon cancer in her mother; Stroke in her father; Uterine cancer in her mother. There is no history of Esophageal cancer.  ROS:   Please see the history of present illness.    Review of Systems  Constitutional:  Negative for chills and fever.  HENT:  Negative for hearing loss and sore throat.   Eyes:  Negative for blurred vision, double vision and redness.  Respiratory:  Negative for shortness of breath.   Cardiovascular:  Positive for palpitations. Negative for chest pain, orthopnea, claudication, leg swelling and PND.  Gastrointestinal:  Positive for heartburn. Negative for blood in stool, melena, nausea and vomiting.  Genitourinary:  Negative for dysuria and flank pain.  Musculoskeletal:  Negative for falls and myalgias.  Neurological:  Negative for dizziness and loss of consciousness.  Endo/Heme/Allergies:  Negative for polydipsia.  Psychiatric/Behavioral:  Negative for substance abuse. The patient has insomnia.     EKGs/Labs/Other Studies Reviewed:    The following studies were reviewed today:  Holter Monitor 10/2020: Holter 11/08/2020: *The observed  rhythms are sinus bradycardia to sinus tachycardia. *The Maximum Heart Rate recorded was 138 bpm, Day 3 / 03:31:00 pm, the Minimum Heart Rate recorded was 47 bpm, Day 3 / 04:57:44 am and the Average Heart Rate was 80 bpm. *There were 356 PVCs with a burden of 0.1 %. *There were [id number] PSVCs with a burden of 6.85 %. There were 32 occurrences of Supraventricular Tachycardia with the longest episode 5 beats, Day 3 / 09:51:38 am and the fastest episode 124 bpm, Day 2 / 02:52:27 pm. *There were 0 Patient triggered events. Occasional supraventricular and ventricular ectopy with no sustained arrhythmias Normal heart rate range and variability  Cardiac MRI 07/08/20: FINDINGS: 1. Small left ventricular size, with LVEDD 50 mm, but LVEDVI 44.39 ml/m2.   Normal left ventricular thickness, with intraventricular septal thickness of 8 mm and  posterior wall thickness of 7 mm.   Mild left ventricular systolic dysfunction (LVEF =50%). There are no regional wall motion abnormalities but global hypokinesis.   There is no late gadolinium enhancement in the left ventricular myocardium.   2. Mild right ventricular enlargement with RVEDVI 104 mL/m2.   RVEDV: * : 168.20 ml   RVESV: * : 84.05 ml   RVSV: * : 84.15 ml   RVEF: * : 50.03 %   RVCO: * : 5103.33 ml/min   RVCI: * : 3.16 l/min/m   HR: * : 60.6/min   Normal right ventricular thickness.   Normal right ventricular systolic function (RVEF Q000111Q). There are no regional wall motion abnormalities or aneurysms.   There is no evidence of RV thrombus.   3. Normal left atrial size and moderate right atrial enlargement, with LAESV 37 mL/m2 and RAESV 53 mL/m2.   4. Normal size of the aortic root and main, left, and right pulmonary arteries. Ascending aorta measures 38 mm; mild dilation for age and BSA.   5.  Abnormalities as below:   Tricuspid Valve: Qualitatively trivial regurgitation   Mitral Valve: Qualitatively trivial  regurgitation   Aortic Valve: Mild-to-moderate aortic regurgitation, regurgitation fraction 35%   Pulmonic Valve: Severe pulmonic regurgitation, regurgitation fraction 60%, valve thickening noted, maximum velocity 3.29 m/s consistent with moderate pulmonic stenosis   Atrial septal defects: There is a left atrial to right atrial communication with velocity decoding from left to right consistent with an ASD. This is best seen on the four chamber stack series.   VSD: There is evidence of an perimembranous VSD with velocity decoding consistent with residual left to right flow.   Qp/Qs: 1.37   6.  Normal pericardium.  Trivial pericardial effusion.   7. Grossly, no extracardiac findings; evidence of prior sternotomy noted. Recommended dedicated study if concerned for non-cardiac pathology.   8.  Breathhold artifact noted.   IMPRESSION: 1. Small left ventricular size.   2. Mild left ventricular systolic dysfunction (LVEF =50%).   3. Mild right ventricular enlargement with RVEDVI 104 mL/m2.   4. Normal right ventricular systolic function (RVEF Q000111Q).   5. There is no evidence of RV thrombus.   6. Moderate right atrial enlargement   7. Ascending aorta measures 38 mm; mild dilation for age and BSA.   8. Mild-to-moderate aortic regurgitation.   9. Severe pulmonic regurgitation with regurgitation fraction 60% and valve thickening noted. Moderate pulmonic stenosis   10. Atrial septal defect incidentally noted.   11.  Perimembranous VSD patch with residual flow   12. Trivial pericardial effusion.  TTE 05/05/20: 1. Patient is s/p repair of Tetrology of Fallot.   2. The patient is s/p VSD patch repair. Suspect a small residual,  restrictive VSD in the the membranous poriton of the septum with L-->R  shunting (best seen on clip 15).   3. The pulmonic valve appears thickened with at least moderately  decreased leaflet excursion. There is mild pulmonic stenosis with mean   gradient 32mHg, peak gradient 143mg. There is severe pulmonic valve  regurgitation.   4. The right ventricular size is moderately enlarged with preserved RV  systolic function. There is normal pulmonary artery systolic pressure.   5. There is a hyperechoic structure seen at the apex of the RV. May  represent trabeculations, however, thrombus is on the differential.  Fotunately, the RV systolic funciton appears preserved. Recommend definity  contrast vs TEE for further evaluation.   6. Left ventricular ejection fraction, by  estimation, is 45 to 50%. The  left ventricle has mildly decreased function. The left ventricle  demonstrates global hypokinesis.   7. The interventricular septum is flattened in diastole ('D' shaped left  ventricle), consistent with right ventricular volume overload.   8. Left atrial size was severely dilated.   9. Right atrial size was severely dilated.  10. The mitral valve is grossly normal. Trivial mitral valve  regurgitation.  11. The tricuspid valve is mildly thickened. There is mild tricuspid  regurgitation  12. The aortic valve is tricuspid. There is mild calcification of the  aortic valve. There is mild thickening of the aortic valve. Aortic valve  regurgitation is mild. Mild aortic valve sclerosis is present, with no  evidence of aortic valve stenosis.  13. Aortic dilatation noted. There is mild dilatation of the ascending  aorta, measuring 36 mm.  14. The inferior vena cava is normal in size with greater than 50%  respiratory variabilty, suggesting right atrial pressure of 3 mmHg.  15. Consider TEE for further evaluation of hyperechoic structure seen in  RV apex and further evaluation of the pulmonic valve.   Comparison(s): Compared to prior echo report in 2013, the pulmonic valve  regurgitation now appears severe with moderate dilation of the RV. There  is evidence of RA/RV pressure overload. The LVEF now appears to be around  50%. The patch  repaired VSD is  visualized. There is a small, residual membranous VSD suspected. Rest of  the changes in detailed report as above.     EKG:  EKG is personally reviewed. 07/20/2021: Sinus rhythm. Rate 61 bpm.   Recent Labs: 12/22/2021: BUN 20; Creatinine, Ser 0.73; Potassium 4.7; Sodium 141   Recent Lipid Panel    Component Value Date/Time   CHOL 148 07/20/2021 1051   TRIG 52 07/20/2021 1051   HDL 60 07/20/2021 1051   CHOLHDL 2.5 07/20/2021 1051   CHOLHDL 2.6 05/25/2014 0843   VLDL 13 05/25/2014 0843   LDLCALC 77 07/20/2021 1051     Physical Exam:    VS:  There were no vitals taken for this visit.    Wt Readings from Last 3 Encounters:  02/02/22 135 lb 3.2 oz (61.3 kg)  12/22/21 132 lb (59.9 kg)  11/15/21 131 lb 9.6 oz (59.7 kg)     GEN:  Well nourished, well developed in no acute distress HEENT: Normal NECK: No JVD; No carotid bruits LYMPHATICS: No lymphadenopathy CARDIAC: RRR, 2/6 systolic murmur and diastolic murmur. No rubs, gallops RESPIRATORY:  Clear to auscultation without rales, wheezing or rhonchi  ABDOMEN: Soft, non-tender, non-distended MUSCULOSKELETAL:  No edema; No deformity  SKIN: Warm and dry NEUROLOGIC:  Alert and oriented x 3 PSYCHIATRIC:  Normal affect   ASSESSMENT:    No diagnosis found.   PLAN:    In order of problems listed above:  #Tetrology of Fallot s/p Repair: #Moderate RV enlargement: #Severe PR, moderate PS Cardiac MRI with LVEF 50%, mild RV enlargement with preserved systolic function, severe PR, moderate PS, ASD, small VSD, mild-to-moderate AI. Follows with Dr. Raul Del who recommended continued surveillance of PR as the patient is relatively asymptomatic with no significant HF symptoms or exercise limitations.  -Followed by Dr. Raul Del -Continued surveillance of PR/PS given relative lack of symptoms  #Palpitations: Cardiac monitor 10/2020 with occasional supraventricular and ventricular ectopy but no sustained arrhythmias. Has  occasional palpitations but no significant symptoms.  #HTN: Better controlled running mainly 120s at home. -Continue valsartan 26m daily -Counseled about low Na diet  #  HLD: -Continue crestor 44m daily -LDL 77 with no known CAD   Follow-up: 1 year.    Medication Adjustments/Labs and Tests Ordered: Current medicines are reviewed at length with the patient today.  Concerns regarding medicines are outlined above.   No orders of the defined types were placed in this encounter.  No orders of the defined types were placed in this encounter.  There are no Patient Instructions on file for this visit.   I,Mathew Stumpf,acting as a sEducation administratorfor HFreada Bergeron MD.,have documented all relevant documentation on the behalf of HFreada Bergeron MD,as directed by  HFreada Bergeron MD while in the presence of HFreada Bergeron MD.  I, HFreada Bergeron MD, have reviewed all documentation for this visit. The documentation on 04/03/22 for the exam, diagnosis, procedures, and orders are all accurate and complete.    Signed, HFreada Bergeron MD  04/03/2022 2:35 PM    [place]

## 2022-04-05 ENCOUNTER — Ambulatory Visit: Payer: Medicare Other | Admitting: Cardiology

## 2022-05-26 ENCOUNTER — Other Ambulatory Visit: Payer: Self-pay | Admitting: Family Medicine

## 2022-05-26 ENCOUNTER — Ambulatory Visit: Payer: Medicare Other | Admitting: Cardiology

## 2022-06-05 ENCOUNTER — Telehealth: Payer: Self-pay | Admitting: Family Medicine

## 2022-06-05 NOTE — Telephone Encounter (Signed)
Called patient to schedule Medicare Annual Wellness Visit (AWV). Left message for patient to call back and schedule Medicare Annual Wellness Visit (AWV).  Last date of AWV: 06/01/2021   Please schedule an AWVS appointment at any time with [place].  If any questions, please contact me at [phone].    Thank you,  [place] Direct dial  [phone]

## 2022-06-13 ENCOUNTER — Other Ambulatory Visit: Payer: Self-pay | Admitting: Family Medicine

## 2022-06-28 ENCOUNTER — Telehealth: Payer: Self-pay | Admitting: Family Medicine

## 2022-06-28 NOTE — Telephone Encounter (Signed)
Contacted Marie Harvey to schedule their annual wellness visit. Appointment made for 07/06/2022.  Thank you,  [facility] Support [facility] Direct dial  [phone]

## 2022-07-06 ENCOUNTER — Ambulatory Visit (INDEPENDENT_AMBULATORY_CARE_PROVIDER_SITE_OTHER): Payer: Medicare Other

## 2022-07-06 DIAGNOSIS — Z0000 Encounter for general adult medical examination without abnormal findings: Secondary | ICD-10-CM | POA: Diagnosis not present

## 2022-07-06 NOTE — Patient Instructions (Signed)

## 2022-07-06 NOTE — Progress Notes (Signed)
I connected with  Marie Harvey on 07/06/22 by a audio enabled telemedicine application and verified that I am speaking with the correct person using two identifiers.  Patient Location: Home  Provider Location: Home Office  I discussed the limitations of evaluation and management by telemedicine. The patient expressed understanding and agreed to proceed.   Subjective:   Marie Harvey is a 67 y.o. female who presents for Medicare Annual (Subsequent) preventive examination.  Review of Systems    Per HPI unless specifically indicated below.  Cardiac Risk Factors include: advanced age (>64men, >13 women);female gender, Hypertension, and Hyperlipidemia.           Objective:        02/02/2022    8:43 AM 12/22/2021    9:26 AM 12/22/2021    8:51 AM  Vitals with BMI  Height     Weight 135 lbs 3 oz  132 lbs  BMI 22.5    Systolic 136 142 161  Diastolic 80 80 83  Pulse 84  86    There were no vitals filed for this visit. There is no height or weight on file to calculate BMI.     07/06/2022    3:28 PM 02/02/2022    8:44 AM 12/22/2021    8:53 AM 11/15/2021    4:00 PM 06/09/2021    8:28 AM 06/01/2021    3:56 PM 01/01/2020   10:11 AM  Advanced Directives  Does Patient Have a Medical Advance Directive? No No No No No Yes No  Type of Careers adviser;Living will   Does patient want to make changes to medical advance directive?      No - Patient declined   Copy of Healthcare Power of Attorney in Chart?      No - copy requested   Would patient like information on creating a medical advance directive? No - Patient declined  No - Patient declined No - Patient declined No - Patient declined  No - Patient declined    Current Medications (verified) Outpatient Encounter Medications as of 07/06/2022  Medication Sig   Cholecalciferol (D3 VITAMIN PO) Take 5,000 Units by mouth at bedtime.   fluticasone (FLONASE) 50 MCG/ACT nasal spray Place 2  sprays into both nostrils daily. (Patient taking differently: Place 2 sprays into both nostrils daily as needed.)   ibuprofen (ADVIL,MOTRIN) 200 MG tablet Take 400 mg by mouth as needed. Takes motrin   latanoprost (XALATAN) 0.005 % ophthalmic solution PLACE 1 DROP INTO BOTH EYES NIGHTLY.   mirtazapine (REMERON) 30 MG tablet TAKE 1 TABLET BY MOUTH AT BEDTIME   rosuvastatin (CRESTOR) 5 MG tablet Take 1 tablet by mouth once daily   valsartan (DIOVAN) 40 MG tablet Take 1 tablet (40 mg total) by mouth daily.   zolpidem (AMBIEN) 5 MG tablet Take 1 tablet (5 mg total) by mouth at bedtime as needed for sleep.   No facility-administered encounter medications on file as of 07/06/2022.    Allergies (verified) Codeine, Calcium-containing compounds, Penicillins, and Vitamin d analogs   History: Past Medical History:  Diagnosis Date   Abnormal RBC    h/o. f/u by PCP   Endometrial hyperplasia without atypia, complex 02/2000   GERD (gastroesophageal reflux disease)    H/O cleft lip    Palate   H/O major depression    H/O tetralogy of Fallot repair 1973   Heart murmur    As a child  Osteoporosis    Past Surgical History:  Procedure Laterality Date   ABDOMINAL SURGERY     CLEFT LIP REPAIR     and palate as child   HERNIA REPAIR     TETRALOGY OF FALLOT REPAIR  1973   Family History  Problem Relation Age of Onset   Uterine cancer Mother    Colon cancer Mother    Stroke Father    Breast cancer Maternal Aunt 84   Esophageal cancer Neg Hx    Social History   Socioeconomic History   Marital status: Single    Spouse name: Not on file   Number of children: 0   Years of education: 12   Highest education level: 12th grade  Occupational History   Occupation: CNA    Comment: [employer]  Tobacco Use   Smoking status: Never    Passive exposure: Never   Smokeless tobacco: Never  Vaping Use   Vaping Use: Never used  Substance and Sexual Activity   Alcohol use: No   Drug use: No    Sexual activity: Not Currently    Birth control/protection: None  Other Topics Concern   Not on file  Social History Narrative   Patient lives with her brother and his wife.    Patient has never been married.   No children.   CNA at [employer].   [employer].   Enjoys readying.   Patient goes to the gym 3x per week- strength training and cardio on treadmill.    Social Determinants of Health   Financial Resource Strain: Low Risk  (07/06/2022)   Overall Financial Resource Strain (CARDIA)    Difficulty of Paying Living Expenses: Not hard at all  Food Insecurity: No Food Insecurity (07/06/2022)   Hunger Vital Sign    Worried About Running Out of Food in the Last Year: Never true    Ran Out of Food in the Last Year: Never true  Transportation Needs: No Transportation Needs (07/06/2022)   PRAPARE - Administrator, Civil Service (Medical): No    Lack of Transportation (Non-Medical): No  Physical Activity: Insufficiently Active (07/06/2022)   Exercise Vital Sign    Days of Exercise per Week: 3 days    Minutes of Exercise per Session: 30 min  Stress: No Stress Concern Present (07/06/2022)   [employer] of Occupational Health - Occupational Stress Questionnaire    Feeling of Stress : Not at all  Social Connections: Moderately Integrated (07/06/2022)   Social Connection and Isolation Panel [NHANES]    Frequency of Communication with Friends and Family: Not on file    Frequency of Social Gatherings with Friends and Family: More than three times a week    Attends Religious Services: More than 4 times per year    Active Member of [employer] or Organizations: Yes    Attends Banker Meetings: Never    Marital Status: Never married    Tobacco Counseling Counseling given: No   Clinical Intake:  Pre-visit preparation completed: No  Pain : No/denies pain     Nutritional Status: BMI of 19-24  Normal Nutritional Risks: None Diabetes: No  How often do you need  to have someone help you when you read instructions, pamphlets, or other written materials from your doctor or pharmacy?: 1 - Never  Diabetic? no  Interpreter Needed?: No  Information entered by :: Laurel Dimmer, CMA   Activities of Daily Living    07/06/2022    3:19 PM  In your present state of health, do you have any difficulty performing the following activities:  Hearing? 1  Comment tinnutis, hearing aids  Vision? 1  Difficulty concentrating or making decisions? 0  Walking or climbing stairs? 0  Dressing or bathing? 0  Doing errands, shopping? 0    Patient Care Team: Latrelle Dodrill, MD as PCP - General (Family Medicine) Meriam Sprague, MD as Consulting Physician (Cardiology) Bond, Doran Stabler, MD as Referring Physician (Ophthalmology) Meryl Dare, MD as Consulting Physician (Gastroenterology)  Indicate any recent Medical Services you may have received from other than [facility] providers in the past year (date may be approximate).     Assessment:   This is a routine wellness examination for [place].  Hearing/Vision screen The pt wear bilateral hearing aids.  Denies any change to her vision. Wear glasses. Annual Eye Exam Dr. Alessandra Bevels   Dietary issues and exercise activities discussed: Current Exercise Habits: Structured exercise class, Type of exercise: walking;strength training/weights, Time (Minutes): 30, Frequency (Times/Week): 3, Weekly Exercise (Minutes/Week): 90, Intensity: Moderate, Exercise limited by: None identified   Goals Addressed   None    Depression Screen    07/06/2022    3:18 PM 02/02/2022    8:45 AM 12/22/2021    8:52 AM 11/15/2021    3:59 PM 06/09/2021    8:28 AM 06/01/2021    3:55 PM 02/15/2021    9:05 AM  PHQ 2/9 Scores  PHQ - 2 Score 1 0 1 0 0 0 0  PHQ- 9 Score  3 6 5 3  3     Fall Risk    07/06/2022    3:19 PM 02/02/2022    8:45 AM 12/22/2021    8:52 AM 06/09/2021    8:27 AM 06/01/2021    3:56 PM  Fall Risk   Falls in  the past year? 0 0 0 0 0  Number falls in past yr: 0  0 0 0  Injury with Fall? 0  0 0 0  Risk for fall due to : No Fall Risks    No Fall Risks;Impaired balance/gait  Follow up Falls evaluation completed    Falls prevention discussed    FALL RISK PREVENTION PERTAINING TO THE HOME:  Any stairs in or around the home? Yes  If so, are there any without handrails? No  Home free of loose throw rugs in walkways, pet beds, electrical cords, etc? Yes  Adequate lighting in your home to reduce risk of falls? Yes   ASSISTIVE DEVICES UTILIZED TO PREVENT FALLS:  Life alert? No  Use of a cane, walker or w/c? No  Grab bars in the bathroom? No  Shower chair or bench in shower? No  Elevated toilet seat or a handicapped toilet? No   TIMED UP AND GO:  Was the test performed? Unable to perform, virtual appointment   Cognitive Function:        07/06/2022    3:24 PM 06/01/2021    3:58 PM  6CIT Screen  What Year? 0 points 0 points  What month? 0 points 0 points  What time? 0 points 0 points  Count back from 20 2 points 0 points  Months in reverse 0 points 0 points  Repeat phrase 0 points 0 points  Total Score 2 points 0 points    Immunizations Immunization History  Administered Date(s) Administered   Fluad Quad(high Dose 65+) 12/22/2021   Influenza Split 12/19/2010, 12/19/2011   Influenza Whole 12/20/2006, 12/30/2007, 12/22/2008, 12/22/2009  Influenza,inj,Quad PF,6+ Mos 12/20/2012, 12/25/2013   Influenza-Unspecified 01/19/2016, 01/03/2017, 01/15/2018, 01/14/2019, 01/06/2020, 01/01/2021   Moderna Sars-Covid-2 Vaccination 03/31/2019, 05/01/2019, 01/17/2020, 07/31/2020, 01/28/2022   PNEUMOCOCCAL CONJUGATE-20 12/15/2020   PPD Test 11/16/2015   Pfizer Covid-19 Vaccine Bivalent Booster 60yrs & up 01/14/2021   Pneumococcal Polysaccharide-23 02/17/1997, 02/20/2019   Td 08/19/1994, 10/16/2007   Tdap 04/24/2020   Zoster Recombinat (Shingrix) 05/08/2018, 03/25/2021, 06/08/2021   Zoster, Live  05/19/2015    TDAP status: Up to date  Flu Vaccine status: Up to date  Pneumococcal vaccine status: Up to date  Covid-19 vaccine status: Information provided on how to obtain vaccines.   Qualifies for Shingles Vaccine? Yes   Zostavax completed Yes   Shingrix Completed?: Yes  Screening Tests Health Maintenance  Topic Date Due   COLONOSCOPY (Pts 45-26yrs Insurance coverage will need to be confirmed)  03/28/2018   COVID-19 Vaccine (7 - 2023-24 season) 03/25/2022   INFLUENZA VACCINE  10/19/2022   Medicare Annual Wellness (AWV)  07/06/2023   MAMMOGRAM  12/15/2023   DTaP/Tdap/Td (4 - Td or Tdap) 04/24/2030   Pneumonia Vaccine 35+ Years old  Completed   DEXA SCAN  Completed   Hepatitis C Screening  Completed   Zoster Vaccines- Shingrix  Completed   HPV VACCINES  Aged Out    Health Maintenance  Health Maintenance Due  Topic Date Due   COLONOSCOPY (Pts 45-7yrs Insurance coverage will need to be confirmed)  03/28/2018   COVID-19 Vaccine (7 - 2023-24 season) 03/25/2022    Colorectal cancer screening: Referral to GI placed and scheduled for 08/20/2021. Pt aware the office will call re: appt.  Mammogram status: Completed 12/14/2021. Repeat every year  DEXA Scan:05/30/2017  Lung Cancer Screening: (Low Dose CT Chest recommended if Age 32-80 years, 30 pack-year currently smoking OR have quit w/in 15years.) does not qualify.   Lung Cancer Screening Referral: not applicable   Additional Screening:  Hepatitis C Screening: does qualify; Completed 05/03/2015  Vision Screening: Recommended annual ophthalmology exams for early detection of glaucoma and other disorders of the eye. Is the patient up to date with their annual eye exam?  Yes  Who is the provider or what is the name of the office in which the patient attends annual eye exams? Dr. Alessandra Bevels, [facility]  If pt is not established with a provider, would they like to be referred to a provider to establish care? No .    Dental Screening: Recommended annual dental exams for proper oral hygiene  Community Resource Referral / Chronic Care Management: CRR required this visit?  No   CCM required this visit?  No      Plan:     I have personally reviewed and noted the following in the patient's chart:   Medical and social history Use of alcohol, tobacco or illicit drugs  Current medications and supplements including opioid prescriptions. Patient is not currently taking opioid prescriptions. Functional ability and status Nutritional status Physical activity Advanced directives List of other physicians Hospitalizations, surgeries, and ER visits in previous 12 months Vitals Screenings to include cognitive, depression, and falls Referrals and appointments  In addition, I have reviewed and discussed with patient certain preventive protocols, quality metrics, and best practice recommendations. A written personalized care plan for preventive services as well as general preventive health recommendations were provided to patient.    Ms. Borntreger , Thank you for taking time to come for your Medicare Wellness Visit. I appreciate your ongoing commitment to your health goals. Please review  the following plan we discussed and let me know if I can assist you in the future.   These are the goals we discussed:  Goals      Patient Stated     Reduce soda intake. Patient reports drinking ~3 cokes (12 oz each) per day.         This is a list of the screening recommended for you and due dates:  Health Maintenance  Topic Date Due   Colon Cancer Screening  03/28/2018   COVID-19 Vaccine (7 - 2023-24 season) 03/25/2022   Flu Shot  10/19/2022   Medicare Annual Wellness Visit  07/06/2023   Mammogram  12/15/2023   DTaP/Tdap/Td vaccine (4 - Td or Tdap) 04/24/2030   Pneumonia Vaccine  Completed   DEXA scan (bone density measurement)  Completed   Hepatitis C Screening: USPSTF Recommendation to screen - Ages 63-79  yo.  Completed   Zoster (Shingles) Vaccine  Completed   HPV Vaccine  Aged [address], [place]   07/06/2022   Nurse Notes: Approximately 30 minute Non-Face -To-Face Medicare Wellness Visit

## 2022-07-13 ENCOUNTER — Other Ambulatory Visit: Payer: Self-pay | Admitting: Family Medicine

## 2022-07-13 DIAGNOSIS — Z83511 Family history of glaucoma: Secondary | ICD-10-CM | POA: Diagnosis not present

## 2022-07-13 DIAGNOSIS — H40013 Open angle with borderline findings, low risk, bilateral: Secondary | ICD-10-CM | POA: Diagnosis not present

## 2022-07-20 ENCOUNTER — Encounter: Payer: Self-pay | Admitting: Family Medicine

## 2022-07-20 ENCOUNTER — Other Ambulatory Visit: Payer: Self-pay

## 2022-07-20 ENCOUNTER — Ambulatory Visit (INDEPENDENT_AMBULATORY_CARE_PROVIDER_SITE_OTHER): Payer: Medicare Other | Admitting: Family Medicine

## 2022-07-20 VITALS — BP 122/86 | HR 62 | Ht 65.0 in | Wt 132.4 lb

## 2022-07-20 DIAGNOSIS — I10 Essential (primary) hypertension: Secondary | ICD-10-CM

## 2022-07-20 DIAGNOSIS — E785 Hyperlipidemia, unspecified: Secondary | ICD-10-CM | POA: Diagnosis not present

## 2022-07-20 DIAGNOSIS — R4589 Other symptoms and signs involving emotional state: Secondary | ICD-10-CM

## 2022-07-20 DIAGNOSIS — G4700 Insomnia, unspecified: Secondary | ICD-10-CM

## 2022-07-20 MED ORDER — ZOLPIDEM TARTRATE 5 MG PO TABS: 5.0000 mg | ORAL_TABLET | Freq: Every evening | ORAL | 5 refills | Status: DC | PRN

## 2022-07-20 NOTE — Progress Notes (Signed)
    SUBJECTIVE:   CHIEF COMPLAINT / HPI:   Marie Harvey is a 67 yo female who presents to the clinic for follow up on HTN and insomnia.  HTN Patient is taking valsartan 40 mg daily. Denies chest pain, headache, dizziness, fainting. She has had some tinnitus but it has been present for many years and contributes to her insomnia. BP today was 146/78 but improved to 122/86 on repeat manual. She is seeing cardiology in July, who she follows for her hx of tetrology of fallot surgery.  Insomnia Patient is taking zolpidem 5 mg every night. She admits that if 1 tablet does not help her fall asleep within an hour she will take another. She says the zopidem works well for her sleep, although she has been running out of her medications quickly. Denies any falls.  Is also taking mirtazapine 30 mg at night.  Tolerating this well.  Mood is overall well-controlled.  Denies thoughts of harming herself or others.  Hyperlipidemia Currently taking Crestor 5 mg daily.  Needs refill of this.  She is not fasting today.  PERTINENT  PMH / PSH: HTN, ToF repair, insomnia, depressed mood  OBJECTIVE:   BP 122/86   Pulse 62   Ht 5\' 5"  (1.651 m)   Wt 132 lb 6.4 oz (60.1 kg)   SpO2 98%   BMI 22.03 kg/m    Gen: alert, well appearing, in no acute distress HEENT: normocephalic, atraumatic CV: RRR, systolic murmur heard best at L 2nd intercostal Pulm: normal WOB, clear to auscultation bilaterally Ab: normoactive bowel sounds, no tenderness or masses Ext: no lower extremity edema Psych: Normal range of affect, speech normal in rate and volume, normal eye contact   ASSESSMENT/PLAN:   Insomnia Patient believes her tinnitus is a reason for her difficulty falling asleep and turns the radio on at night to help. Her sleep is much improved with zolpidem. She admits that she sometimes takes two 5 mg tablets if one does not help her sleep. Sent in additional tablets so she can take 2 as needed up to 15 days per month.  She has had no issues with this dose and no falls. She acknowledges the risk of taking zolpidem in advanced age and thinks poor sleep is a higher risk for sedation/falling than meds.  Hypertension Patient is taking valsartan 40mg  daily. Her BP today was well controlled at 122/86 on repeat. She follows up with cardiology in July for hx of tetrology of Fallot repair. Continue current regimen. Follow up in 6 months.  Depressed mood Patient reports that her mood is much improved. Continue current regimen and follow up as needed.  Hyperlipidemia Return for fasting lipids.  Continue Crestor.  Titrate as needed based on lipid results.   Health Maintenance Scheduled for colonoscopy June 3  Barrett Shell, Medical Student [facility]   Patient seen along with medical student Will Dabbs. I personally evaluated this patient along with the student, and verified all aspects of the history, physical exam, and medical decision making as documented by the student. I agree with the student's documentation and have made all necessary edits.  Levert Feinstein, MD  [facility]

## 2022-07-20 NOTE — Patient Instructions (Signed)
It was great to see you again today.  Refilled Ambien.  Sent in higher quantities you can try an extra dose about half of the nights.  Please let me know if you have any side effects from this.  Come back next week for fasting labs to check cholesterol. Follow-up with me in 6 months, sooner if needed.  Be well, Dr. Pollie Meyer

## 2022-07-20 NOTE — Assessment & Plan Note (Signed)
Patient is taking valsartan 40mg  daily. Her BP today was well controlled at 122/86 on repeat. She follows up with cardiology in July for hx of tetrology of Fallot repair. Continue current regimen. Follow up in 6 months.

## 2022-07-20 NOTE — Assessment & Plan Note (Addendum)
Patient believes her tinnitus is a reason for her difficulty falling asleep and turns the radio on at night to help. Her sleep is much improved with zolpidem. She admits that she sometimes takes two 5 mg tablets if one does not help her sleep. Sent in additional tablets so she can take 2 as needed up to 15 days per month. She has had no issues with this dose and no falls. She acknowledges the risk of taking zolpidem in advanced age and thinks poor sleep is a higher risk for sedation/falling than meds.

## 2022-07-20 NOTE — Assessment & Plan Note (Signed)
Patient reports that her mood is much improved. Continue current regimen and follow up as needed.

## 2022-07-20 NOTE — Assessment & Plan Note (Signed)
Return for fasting lipids.  Continue Crestor.  Titrate as needed based on lipid results.

## 2022-07-23 ENCOUNTER — Other Ambulatory Visit: Payer: Self-pay | Admitting: Family Medicine

## 2022-07-24 ENCOUNTER — Telehealth: Payer: Self-pay

## 2022-07-24 NOTE — Telephone Encounter (Signed)
A Prior Authorization was initiated for this patients ZOLPIDEM through CoverMyMeds.   Key: [id number]

## 2022-07-26 ENCOUNTER — Other Ambulatory Visit: Payer: Medicare Other

## 2022-07-26 DIAGNOSIS — E785 Hyperlipidemia, unspecified: Secondary | ICD-10-CM

## 2022-07-26 NOTE — Telephone Encounter (Signed)
[employer] calls nurse line to report approval.   Approved through 07/25/2022-07/25/2023.  30 tabs per 15 days.   Pharmacy has been updated.

## 2022-07-27 LAB — LIPID PANEL
Chol/HDL Ratio: 2.5 ratio (ref 0.0–4.4)
Cholesterol, Total: 157 mg/dL (ref 100–199)
HDL: 62 mg/dL (ref >39)
LDL Chol Calc (NIH): 81 mg/dL (ref 0–99)
Triglycerides: 69 mg/dL (ref 0–149)
VLDL Cholesterol Cal: 14 mg/dL (ref 5–40)

## 2022-07-27 NOTE — Telephone Encounter (Signed)
Prior Auth for patients medication ZOLPIDEM approved by [facility] MEDICARE.

## 2022-08-08 DIAGNOSIS — H43813 Vitreous degeneration, bilateral: Secondary | ICD-10-CM | POA: Diagnosis not present

## 2022-08-21 DIAGNOSIS — Z1211 Encounter for screening for malignant neoplasm of colon: Secondary | ICD-10-CM | POA: Diagnosis not present

## 2022-08-21 DIAGNOSIS — K5730 Diverticulosis of large intestine without perforation or abscess without bleeding: Secondary | ICD-10-CM | POA: Diagnosis not present

## 2022-08-21 DIAGNOSIS — D125 Benign neoplasm of sigmoid colon: Secondary | ICD-10-CM | POA: Diagnosis not present

## 2022-08-21 LAB — HM COLONOSCOPY

## 2022-09-25 ENCOUNTER — Encounter: Payer: Self-pay | Admitting: Family Medicine

## 2022-09-25 NOTE — Progress Notes (Unsigned)
Cardiology Office Note:    Date:  09/25/2022   ID:  Marie Harvey, DOB 29-Jul-1955, MRN [id number]  PCP:  Latrelle Dodrill, MD   [facility]  Cardiologist:  None  Advanced Practice Provider:  No care team member to display Electrophysiologist:  None   Referring MD: Latrelle Dodrill, MD     History of Present Illness:    Marie Harvey is a 67 y.o. female with a hx of tetrology of fallot s/p repair 1973, GERD and depression who presents to clinic for follow-up.  Patient underwent repair in 1973 for tetralogy of fallot at [facility]. Was followed with congenital specialist for several years but had not seen a Cardiologist in 40 years. TTE 05/05/20 with LVEF 45-50%, possible small residual VSD, moderately enlarged RV with preserved RV function, d-shaped septum mild PS with mean gradient , peak gradient , severe PR; mild TR. Severely enlarged LA and RA, and hyperechoic structure seen at the apex of RV. Mildly dilated ascending aorta 36mm.  She subsequently underwent cardiac MR on 07/11/20 which demonstrated mild-to-moderate AR, severe PR, moderate PS, ASD, perimembranous VSD, Qp:Qs 1.37, LVEF 50%, mild RV enlargement with preserved RV systolic function, no RV thrombus, moderate RAE, mild ascending aortia dilation.  Saw Dr. Meredeth Ide on 06/10/20 where a Holter monitor was placed for palpitations. Planned for continued monitoring of her PR given relative lack of symptoms.  Returned to visit Dr. Meredeth Ide on 07/14/21 where she was doing well. She remained active without significant symptoms. Planned for continued watchful waiting.   Was last seen in 07/2021. Felt well with no significant CV symptoms. Wished to continue with monitoring of her severe PR/moderate PS.  Today, ***  Past Medical History:  Diagnosis Date   Abnormal RBC    h/o. f/u by PCP   Endometrial hyperplasia without atypia, complex 02/2000   GERD (gastroesophageal reflux disease)     H/O cleft lip    Palate   H/O major depression    H/O tetralogy of Fallot repair 1973   Heart murmur    As a child   Osteoporosis     Past Surgical History:  Procedure Laterality Date   ABDOMINAL SURGERY     CLEFT LIP REPAIR     and palate as child   HERNIA REPAIR     TETRALOGY OF FALLOT REPAIR  1973    Current Medications: No outpatient medications have been marked as taking for the 09/27/22 encounter (Appointment) with Meriam Sprague, MD.     Allergies:   Codeine, Calcium-containing compounds, Penicillins, and Vitamin d analogs   Social History   Socioeconomic History   Marital status: Single    Spouse name: Not on file   Number of children: 0   Years of education: 12   Highest education level: 12th grade  Occupational History   Occupation: CNA    Comment: [employer]  Tobacco Use   Smoking status: Never    Passive exposure: Never   Smokeless tobacco: Never  Vaping Use   Vaping Use: Never used  Substance and Sexual Activity   Alcohol use: No   Drug use: No   Sexual activity: Not Currently    Birth control/protection: None  Other Topics Concern   Not on file  Social History Narrative   Patient lives with her brother and his wife.    Patient has never been married.   No children.   CNA at [employer].   [employer].  Enjoys readying.   Patient goes to the gym 3x per week- strength training and cardio on treadmill.    Social Determinants of Health   Financial Resource Strain: Low Risk  (07/06/2022)   Overall Financial Resource Strain (CARDIA)    Difficulty of Paying Living Expenses: Not hard at all  Food Insecurity: No Food Insecurity (07/06/2022)   Hunger Vital Sign    Worried About Running Out of Food in the Last Year: Never true    Ran Out of Food in the Last Year: Never true  Transportation Needs: No Transportation Needs (07/06/2022)   PRAPARE - Administrator, Civil Service (Medical): No    Lack of Transportation (Non-Medical): No   Physical Activity: Insufficiently Active (07/06/2022)   Exercise Vital Sign    Days of Exercise per Week: 3 days    Minutes of Exercise per Session: 30 min  Stress: No Stress Concern Present (07/06/2022)   [employer] of Occupational Health - Occupational Stress Questionnaire    Feeling of Stress : Not at all  Social Connections: Moderately Integrated (07/06/2022)   Social Connection and Isolation Panel [NHANES]    Frequency of Communication with Friends and Family: Not on file    Frequency of Social Gatherings with Friends and Family: More than three times a week    Attends Religious Services: More than 4 times per year    Active Member of [employer] or Organizations: Yes    Attends Banker Meetings: Never    Marital Status: Never married     Family History: The patient's family history includes Breast cancer (age of onset: 49) in her maternal aunt; Colon cancer in her mother; Stroke in her father; Uterine cancer in her mother. There is no history of Esophageal cancer.  ROS:   Please see the history of present illness.      EKGs/Labs/Other Studies Reviewed:    The following studies were reviewed today:  Holter Monitor 10/2020: Holter 11/08/2020: *The observed rhythms are sinus bradycardia to sinus tachycardia. *The Maximum Heart Rate recorded was 138 bpm, Day 3 / 03:31:00 pm, the Minimum Heart Rate recorded was 47 bpm, Day 3 / 04:57:44 am and the Average Heart Rate was 80 bpm. *There were 356 PVCs with a burden of 0.1 %. *There were [phone] PSVCs with a burden of 6.85 %. There were 32 occurrences of Supraventricular Tachycardia with the longest episode 5 beats, Day 3 / 09:51:38 am and the fastest episode 124 bpm, Day 2 / 02:52:27 pm. *There were 0 Patient triggered events. Occasional supraventricular and ventricular ectopy with no sustained arrhythmias Normal heart rate range and variability  Cardiac MRI 07/08/20: FINDINGS: 1. Small left ventricular size, with  LVEDD 50 mm, but LVEDVI 44.39 ml/m2.   Normal left ventricular thickness, with intraventricular septal thickness of 8 mm and posterior wall thickness of 7 mm.   Mild left ventricular systolic dysfunction (LVEF =50%). There are no regional wall motion abnormalities but global hypokinesis.   There is no late gadolinium enhancement in the left ventricular myocardium.   2. Mild right ventricular enlargement with RVEDVI 104 mL/m2.   RVEDV: * : 168.20 ml   RVESV: * : 84.05 ml   RVSV: * : 84.15 ml   RVEF: * : 50.03 %   RVCO: * : 5103.33 ml/min   RVCI: * : 3.16 l/min/m   HR: * : 60.6/min   Normal right ventricular thickness.   Normal right ventricular systolic function (RVEF =50%). There are no  regional wall motion abnormalities or aneurysms.   There is no evidence of RV thrombus.   3. Normal left atrial size and moderate right atrial enlargement, with LAESV 37 mL/m2 and RAESV 53 mL/m2.   4. Normal size of the aortic root and main, left, and right pulmonary arteries. Ascending aorta measures 38 mm; mild dilation for age and BSA.   5.  Abnormalities as below:   Tricuspid Valve: Qualitatively trivial regurgitation   Mitral Valve: Qualitatively trivial regurgitation   Aortic Valve: Mild-to-moderate aortic regurgitation, regurgitation fraction 35%   Pulmonic Valve: Severe pulmonic regurgitation, regurgitation fraction 60%, valve thickening noted, maximum velocity 3.29 m/s consistent with moderate pulmonic stenosis   Atrial septal defects: There is a left atrial to right atrial communication with velocity decoding from left to right consistent with an ASD. This is best seen on the four chamber stack series.   VSD: There is evidence of an perimembranous VSD with velocity decoding consistent with residual left to right flow.   Qp/Qs: 1.37   6.  Normal pericardium.  Trivial pericardial effusion.   7. Grossly, no extracardiac findings; evidence of prior  sternotomy noted. Recommended dedicated study if concerned for non-cardiac pathology.   8.  Breathhold artifact noted.   IMPRESSION: 1. Small left ventricular size.   2. Mild left ventricular systolic dysfunction (LVEF =50%).   3. Mild right ventricular enlargement with RVEDVI 104 mL/m2.   4. Normal right ventricular systolic function (RVEF =50%).   5. There is no evidence of RV thrombus.   6. Moderate right atrial enlargement   7. Ascending aorta measures 38 mm; mild dilation for age and BSA.   8. Mild-to-moderate aortic regurgitation.   9. Severe pulmonic regurgitation with regurgitation fraction 60% and valve thickening noted. Moderate pulmonic stenosis   10. Atrial septal defect incidentally noted.   11.  Perimembranous VSD patch with residual flow   12. Trivial pericardial effusion.  TTE 05/05/20: 1. Patient is s/p repair of Tetrology of Fallot.   2. The patient is s/p VSD patch repair. Suspect a small residual,  restrictive VSD in the the membranous poriton of the septum with L-->R  shunting (best seen on clip 15).   3. The pulmonic valve appears thickened with at least moderately  decreased leaflet excursion. There is mild pulmonic stenosis with mean  gradient , peak gradient . There is severe pulmonic valve  regurgitation.   4. The right ventricular size is moderately enlarged with preserved RV  systolic function. There is normal pulmonary artery systolic pressure.   5. There is a hyperechoic structure seen at the apex of the RV. May  represent trabeculations, however, thrombus is on the differential.  Fotunately, the RV systolic funciton appears preserved. Recommend definity  contrast vs TEE for further evaluation.   6. Left ventricular ejection fraction, by estimation, is 45 to 50%. The  left ventricle has mildly decreased function. The left ventricle  demonstrates global hypokinesis.   7. The interventricular septum is flattened in diastole ('D'  shaped left  ventricle), consistent with right ventricular volume overload.   8. Left atrial size was severely dilated.   9. Right atrial size was severely dilated.  10. The mitral valve is grossly normal. Trivial mitral valve  regurgitation.  11. The tricuspid valve is mildly thickened. There is mild tricuspid  regurgitation  12. The aortic valve is tricuspid. There is mild calcification of the  aortic valve. There is mild thickening of the aortic valve. Aortic valve  regurgitation is  mild. Mild aortic valve sclerosis is present, with no  evidence of aortic valve stenosis.  13. Aortic dilatation noted. There is mild dilatation of the ascending  aorta, measuring 36 mm.  14. The inferior vena cava is normal in size with greater than 50%  respiratory variabilty, suggesting right atrial pressure of 3 mmHg.  15. Consider TEE for further evaluation of hyperechoic structure seen in  RV apex and further evaluation of the pulmonic valve.   Comparison(s): Compared to prior echo report in 2013, the pulmonic valve  regurgitation now appears severe with moderate dilation of the RV. There  is evidence of RA/RV pressure overload. The LVEF now appears to be around  50%. The patch repaired VSD is  visualized. There is a small, residual membranous VSD suspected. Rest of  the changes in detailed report as above.     EKG:  EKG is personally reviewed. 07/20/2021: Sinus rhythm. Rate 61 bpm.   Recent Labs: 12/22/2021: BUN 20; Creatinine, Ser 0.73; Potassium 4.7; Sodium 141   Recent Lipid Panel    Component Value Date/Time   CHOL 157 07/26/2022 1237   TRIG 69 07/26/2022 1237   HDL 62 07/26/2022 1237   CHOLHDL 2.5 07/26/2022 1237   CHOLHDL 2.6 05/25/2014 0843   VLDL 13 05/25/2014 0843   LDLCALC 81 07/26/2022 1237     Physical Exam:    VS:  There were no vitals taken for this visit.    Wt Readings from Last 3 Encounters:  07/20/22 132 lb 6.4 oz (60.1 kg)  02/02/22 135 lb 3.2 oz (61.3 kg)   12/22/21 132 lb (59.9 kg)     GEN:  Well nourished, well developed in no acute distress HEENT: Normal NECK: No JVD; No carotid bruits LYMPHATICS: No lymphadenopathy CARDIAC: RRR, 2/6 systolic murmur and diastolic murmur. No rubs, gallops RESPIRATORY:  Clear to auscultation without rales, wheezing or rhonchi  ABDOMEN: Soft, non-tender, non-distended MUSCULOSKELETAL:  No edema; No deformity  SKIN: Warm and dry NEUROLOGIC:  Alert and oriented x 3 PSYCHIATRIC:  Normal affect   ASSESSMENT:    No diagnosis found.   PLAN:    In order of problems listed above:  #Tetrology of Fallot s/p Repair: #Moderate RV enlargement: #Severe PR, moderate PS Cardiac MRI with LVEF 50%, mild RV enlargement with preserved systolic function, severe PR, moderate PS, ASD, small VSD, mild-to-moderate AI. Has been seen by Dr. Meredeth Ide who recommended continued surveillance of PR as the patient is relatively asymptomatic with no significant HF symptoms or exercise limitations.  -Followed by Dr. Meredeth Ide -Continued surveillance of PR/PS given relative lack of symptoms  #Palpitations: Cardiac monitor 10/2020 with occasional supraventricular and ventricular ectopy but no sustained arrhythmias. Has occasional palpitations but no significant symptoms.  #HTN: Better controlled running mainly 120s at home. -Continue valsartan 40mg  daily -Counseled about low Na diet  #HLD: -Continue crestor 5mg  daily -Repeat lipids   Follow-up: 1 year.    Medication Adjustments/Labs and Tests Ordered: Current medicines are reviewed at length with the patient today.  Concerns regarding medicines are outlined above.   No orders of the defined types were placed in this encounter.  No orders of the defined types were placed in this encounter.  There are no Patient Instructions on file for this visit.    Signed, Meriam Sprague, MD  09/25/2022 9:03 PM    [facility]

## 2022-09-27 ENCOUNTER — Encounter: Payer: Self-pay | Admitting: Cardiology

## 2022-09-27 ENCOUNTER — Ambulatory Visit: Payer: Medicare Other | Attending: Cardiology | Admitting: Cardiology

## 2022-09-27 VITALS — BP 134/80 | HR 82 | Ht 65.0 in | Wt 127.6 lb

## 2022-09-27 DIAGNOSIS — E785 Hyperlipidemia, unspecified: Secondary | ICD-10-CM

## 2022-09-27 DIAGNOSIS — Z13220 Encounter for screening for lipoid disorders: Secondary | ICD-10-CM | POA: Diagnosis not present

## 2022-09-27 DIAGNOSIS — I371 Nonrheumatic pulmonary valve insufficiency: Secondary | ICD-10-CM

## 2022-09-27 DIAGNOSIS — I10 Essential (primary) hypertension: Secondary | ICD-10-CM | POA: Diagnosis not present

## 2022-09-27 DIAGNOSIS — Q213 Tetralogy of Fallot: Secondary | ICD-10-CM | POA: Diagnosis not present

## 2022-09-27 DIAGNOSIS — Q221 Congenital pulmonary valve stenosis: Secondary | ICD-10-CM

## 2022-09-27 DIAGNOSIS — Q249 Congenital malformation of heart, unspecified: Secondary | ICD-10-CM

## 2022-09-27 MED ORDER — VALSARTAN 40 MG PO TABS: 40.0000 mg | ORAL_TABLET | Freq: Every day | ORAL | 3 refills | Status: DC

## 2022-09-27 NOTE — Patient Instructions (Addendum)
Medication Instructions:  Your physician recommends that you continue on your current medications as directed. Please refer to the Current Medication list given to you today.  *If you need a refill on your cardiac medications before your next appointment, please call your pharmacy*  Lab Work: None ordered today.  Testing/Procedures: None ordered today.  Follow-Up: At [facility], you and your health needs are our priority.  As part of our continuing mission to provide you with exceptional heart care, we have created designated Provider Care Teams.  These Care Teams include your primary Cardiologist (physician) and Advanced Practice Providers (APPs -  Physician Assistants and Nurse Practitioners) who all work together to provide you with the care you need, when you need it.  Your next appointment:   6 month(s)  The format for your next appointment:   In Person  Provider:   Nathaniel Man, MD

## 2022-11-02 ENCOUNTER — Other Ambulatory Visit: Payer: Self-pay | Admitting: Family Medicine

## 2022-11-02 DIAGNOSIS — Z1231 Encounter for screening mammogram for malignant neoplasm of breast: Secondary | ICD-10-CM

## 2022-11-22 ENCOUNTER — Telehealth: Payer: Self-pay | Admitting: Family Medicine

## 2022-11-22 DIAGNOSIS — Z1231 Encounter for screening mammogram for malignant neoplasm of breast: Secondary | ICD-10-CM

## 2022-11-22 NOTE — Telephone Encounter (Signed)
Patient dropped off resident medical information form to be completed. Last DOS was 07/20/22. Placed in [employer].

## 2022-11-23 NOTE — Telephone Encounter (Signed)
Placed in MDs box to be filled out. Deseree Blount, CMA  

## 2022-11-27 NOTE — Telephone Encounter (Signed)
Forms completed, will return to [facility] RN team. Leeanne Rio, MD

## 2022-11-29 DIAGNOSIS — M1990 Unspecified osteoarthritis, unspecified site: Secondary | ICD-10-CM | POA: Diagnosis not present

## 2022-11-29 DIAGNOSIS — F330 Major depressive disorder, recurrent, mild: Secondary | ICD-10-CM | POA: Diagnosis not present

## 2022-11-29 NOTE — Telephone Encounter (Signed)
Form placed up front for pick up.  Copy made for batch scanning.   Attempted to contact patient, however I had to LVM.

## 2022-12-10 ENCOUNTER — Other Ambulatory Visit: Payer: Self-pay | Admitting: Family Medicine

## 2023-01-24 ENCOUNTER — Ambulatory Visit
Admission: RE | Admit: 2023-01-24 | Discharge: 2023-01-24 | Disposition: A | Discharge status: Home / Self Care | Payer: Medicare Other | Source: Ambulatory Visit | Attending: Family Medicine | Admitting: Family Medicine

## 2023-01-24 DIAGNOSIS — Z1231 Encounter for screening mammogram for malignant neoplasm of breast: Secondary | ICD-10-CM | POA: Diagnosis not present

## 2023-01-31 ENCOUNTER — Ambulatory Visit: Payer: Medicare Other | Admitting: Family Medicine

## 2023-02-02 ENCOUNTER — Other Ambulatory Visit: Payer: Self-pay | Admitting: Family Medicine

## 2023-02-06 NOTE — Telephone Encounter (Signed)
Please let patient know I am refilling this medication, but she needs to schedule an appointment with me. Her appointment was canceled during the recent emergency closure last week. She can reschedule at her convenience.  Thanks, Latrelle Dodrill, MD

## 2023-02-19 DIAGNOSIS — Z83511 Family history of glaucoma: Secondary | ICD-10-CM | POA: Diagnosis not present

## 2023-02-19 DIAGNOSIS — H40013 Open angle with borderline findings, low risk, bilateral: Secondary | ICD-10-CM | POA: Diagnosis not present

## 2023-02-20 IMAGING — MR MR CARD MORPHOLOGY WO/W CM
45 of 48 series · 45 of 48 positions shown · IV contrast (gadavist)
Comparison: none

CLINICAL DATA: Clinical question of RV Mass; Tetralogy of Ndathika
64 year old female

Study assumes BSA 1.6 m2
EXAM:
CARDIAC MRI
TECHNIQUE: The patient was scanned on a 1.5 Tesla GE magnet. A dedicated
cardiac coil was used. Functional imaging was done using Fiesta
sequences. [DATE], and 4 chamber views were done to assess for RWMA's.
Modified Joenggu rule using a short axis stack was used to
calculate an ejection fraction on a dedicated work station using
Circle software. The patient received 10 cc of Gadavist. After 10
minutes inversion recovery sequences were used to assess for
infiltration and scar tissue.
CONTRAST:  10 cc  of Gadavist

[Series 4: [id number] · axial · 8.0mm · 1.41mm/px · 1 of 16 slices shown]
[im 1/16]
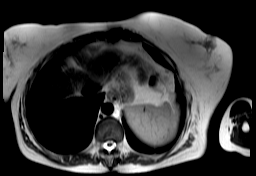

[Series 8: bSSFP · oblique · 8.0mm · 1.61mm/px · 1 of 25 slices shown (1 of 19)]
[im 1/25]
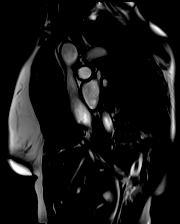

[Series 9: bSSFP · oblique · 8.0mm · 1.61mm/px · 1 of 25 slices shown (2 of 19)]
[im 1/25]
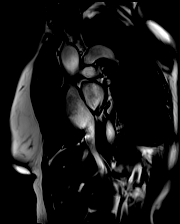

[Series 10: bSSFP · oblique · 8.0mm · 1.61mm/px · 1 of 25 slices shown (3 of 19)]
[im 1/25]
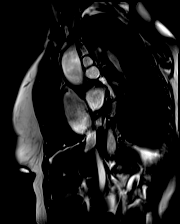

[Series 11: bSSFP · oblique · 8.0mm · 1.61mm/px · 1 of 25 slices shown (4 of 19)]
[im 1/25]
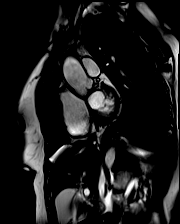

[Series 12: bSSFP · oblique · 8.0mm · 1.61mm/px · 1 of 25 slices shown (5 of 19)]
[im 1/25]
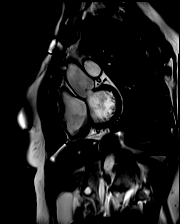

[Series 13: bSSFP · oblique · 8.0mm · 1.61mm/px · 1 of 25 slices shown (6 of 19)]
[im 1/25]
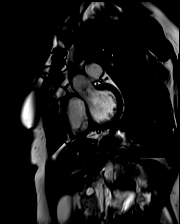

[Series 14: bSSFP · oblique · 8.0mm · 1.61mm/px · 1 of 25 slices shown (7 of 19)]
[im 1/25]
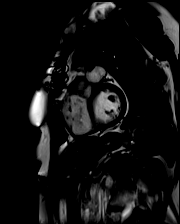

[Series 15: bSSFP · oblique · 8.0mm · 1.61mm/px · 1 of 25 slices shown (8 of 19)]
[im 1/25]
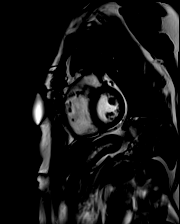

[Series 16: bSSFP · oblique · 8.0mm · 1.61mm/px · 1 of 25 slices shown (9 of 19)]
[im 1/25]
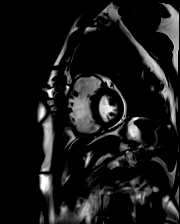

[Series 17: bSSFP · oblique · 8.0mm · 1.61mm/px · 1 of 25 slices shown (10 of 19)]
[im 1/25]
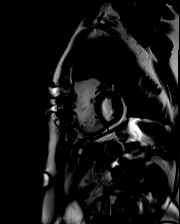

[Series 18: bSSFP · oblique · 8.0mm · 1.61mm/px · 1 of 25 slices shown (11 of 19)]
[im 1/25]
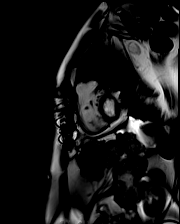

[Series 19: bSSFP · oblique · 8.0mm · 1.61mm/px · 1 of 25 slices shown (12 of 19)]
[im 1/25]
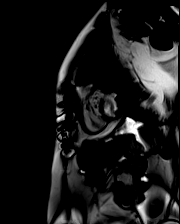

[Series 20: bSSFP · oblique · 8.0mm · 1.61mm/px · 1 of 25 slices shown (13 of 19)]
[im 1/25]
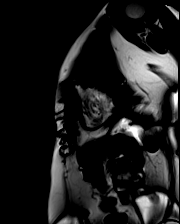

[Series 21: bSSFP · oblique · 8.0mm · 1.61mm/px · 1 of 25 slices shown (14 of 19)]
[im 1/25]
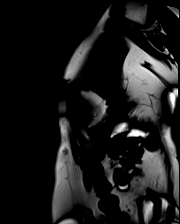

[Series 22: bSSFP · oblique · 8.0mm · 1.61mm/px · 1 of 25 slices shown (15 of 19)]
[im 1/25]
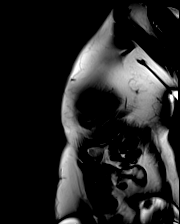

[Series 23: bSSFP · oblique · 8.0mm · 1.61mm/px · 1 of 25 slices shown (16 of 19)]
[im 1/25]
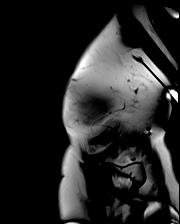

[Series 24: (id)_long_t1 · oblique · 8.0mm · 2.08mm/px · 1 of 24 slices shown]
[im 1/24]
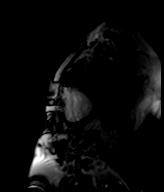

[Series 25: (id)_long_t1_moco · oblique · 8.0mm · 2.08mm/px · 1 of 24 slices shown]
[im 1/24]
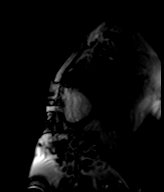

[Series 28: (id)_trufi · oblique · 8.0mm · 2.08mm/px · 1 of 9 slices shown]
[im 1/9]
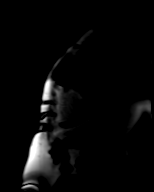

[Series 29: (id)_trufi_moco · oblique · 8.0mm · 2.08mm/px · 1 of 9 slices shown]
[im 1/9]
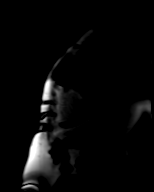

[Series 32: bSSFP · oblique · 6.0mm · 1.41mm/px · 1 of 25 slices shown (17 of 19)]
[im 1/25]
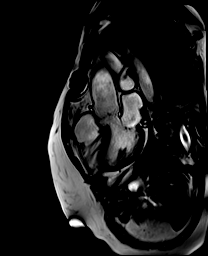

[Series 33: bSSFP · coronal · 6.0mm · 1.41mm/px · 1 of 25 slices shown (18 of 19)]
[im 1/25]
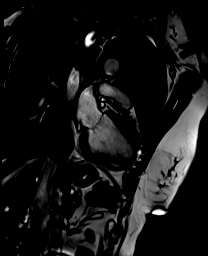

[Series 34: bSSFP · oblique · 6.0mm · 1.41mm/px · 1 of 25 slices shown (19 of 19)]
[im 1/25]
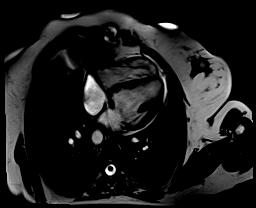

[Series 35: cine rvot · sagittal · 6.0mm · 1.41mm/px · 1 of 25 slices shown (1 of 4)]
[im 1/25]
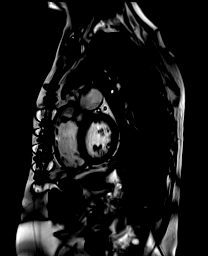

[Series 36: cor rvot · oblique · 6.0mm · 1.41mm/px · 1 of 25 slices shown]
[im 1/25]
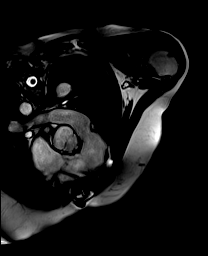

[Series 37: cine (4 ch) · oblique · 6.0mm · 1.41mm/px · 1 of 25 slices shown (1 of 16)]
[im 1/25]
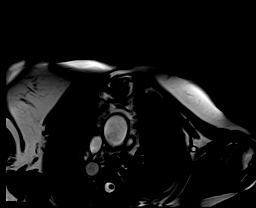

[Series 38: cine (4 ch) · oblique · 6.0mm · 1.41mm/px · 1 of 25 slices shown (2 of 16)]
[im 1/25]
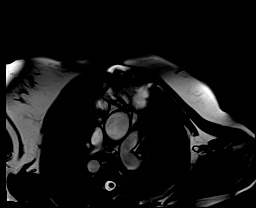

[Series 39: cine (4 ch) · oblique · 6.0mm · 1.41mm/px · 1 of 25 slices shown (3 of 16)]
[im 1/25]
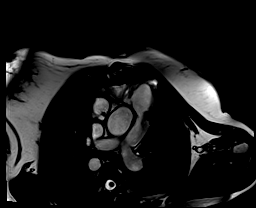

[Series 40: cine (4 ch) · oblique · 6.0mm · 1.41mm/px · 1 of 25 slices shown (4 of 16)]
[im 1/25]
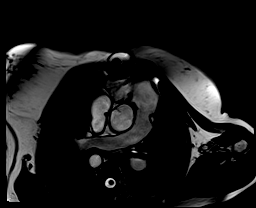

[Series 41: cine (4 ch) · oblique · 6.0mm · 1.41mm/px · 1 of 25 slices shown (5 of 16)]
[im 1/25]
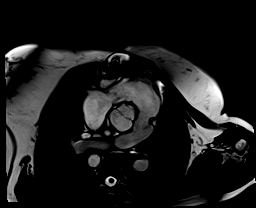

[Series 42: cine (4 ch) · oblique · 6.0mm · 1.41mm/px · 1 of 25 slices shown (6 of 16)]
[im 1/25]
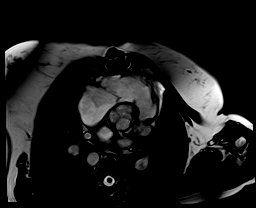

[Series 43: cine (4 ch) · oblique · 6.0mm · 1.41mm/px · 1 of 25 slices shown (7 of 16)]
[im 1/25]
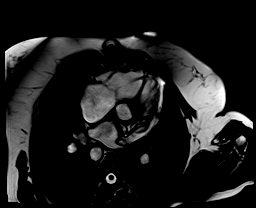

[Series 44: cine (4 ch) · oblique · 6.0mm · 1.41mm/px · 1 of 25 slices shown (8 of 16)]
[im 1/25]
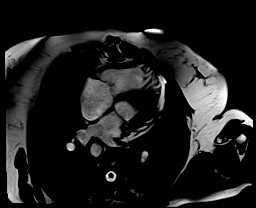

[Series 45: cine (4 ch) · oblique · 6.0mm · 1.41mm/px · 1 of 25 slices shown (9 of 16)]
[im 1/25]
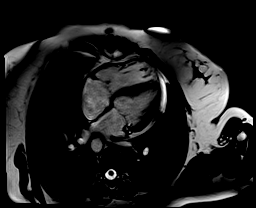

[Series 46: cine (4 ch) · oblique · 6.0mm · 1.41mm/px · 1 of 25 slices shown (10 of 16)]
[im 1/25]
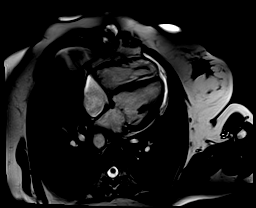

[Series 47: cine (4 ch) · oblique · 6.0mm · 1.41mm/px · 1 of 25 slices shown (11 of 16)]
[im 1/25]
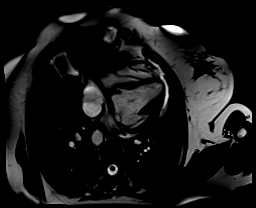

[Series 48: cine (4 ch) · oblique · 6.0mm · 1.41mm/px · 1 of 25 slices shown (12 of 16)]
[im 1/25]
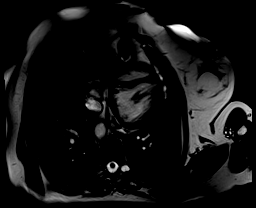

[Series 49: cine (4 ch) · oblique · 6.0mm · 1.41mm/px · 1 of 25 slices shown (13 of 16)]
[im 1/25]
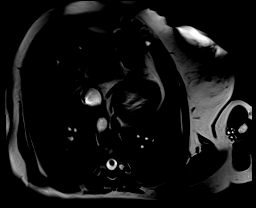

[Series 50: cine (4 ch) · oblique · 6.0mm · 1.41mm/px · 1 of 25 slices shown (14 of 16)]
[im 1/25]
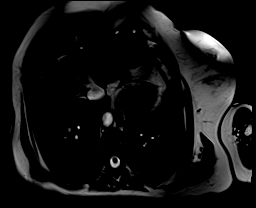

[Series 51: cine (4 ch) · oblique · 6.0mm · 1.41mm/px · 1 of 25 slices shown (15 of 16)]
[im 1/25]
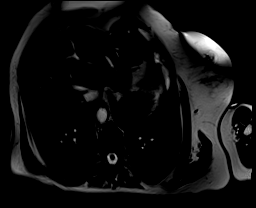

[Series 52: cine (4 ch) · oblique · 6.0mm · 1.41mm/px · 1 of 25 slices shown (16 of 16)]
[im 1/25]
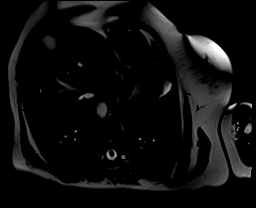

[Series 53: cine rvot · sagittal · 5.0mm · 1.41mm/px · 1 of 25 slices shown (2 of 4)]
[im 1/25]
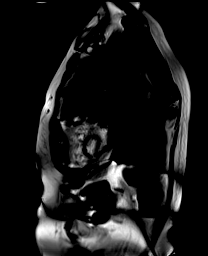

[Series 54: cine rvot · sagittal · 5.0mm · 1.41mm/px · 1 of 25 slices shown (3 of 4)]
[im 1/25]
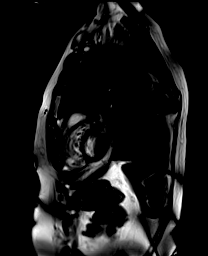

[Series 55: cine rvot · sagittal · 5.0mm · 1.41mm/px · 1 of 25 slices shown (4 of 4)]
[im 1/25]
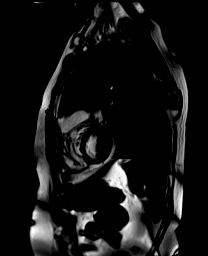

[45 of 48 positions shown; findings below may reference images not displayed]

FINDINGS: 1. Small left ventricular size, with LVEDD 50 mm, but LVEDVI
ml/m2.

Normal left ventricular thickness, with intraventricular septal
thickness of 8 mm and posterior wall thickness of 7 mm.

Mild left ventricular systolic dysfunction (LVEF =50%). There are no
regional wall motion abnormalities but global hypokinesis.

There is no late gadolinium enhancement in the left ventricular
myocardium.

2. Mild right ventricular enlargement with RVEDVI 104 mL/m2.

RVEDV:
* : 168.20 ml

RVESV:
* : 84.05 ml

RVSV:
* : 84.15 ml

RVEF:
* : 50.03 %

RVCO:
* : 5103.33 ml/min

RVCI:
* : 3.16 l/min/m

HR:
* : 60.6/min

Normal right ventricular thickness.

Normal right ventricular systolic function (RVEF =50%). There are no
regional wall motion abnormalities or aneurysms.

There is no evidence of RV thrombus.

3. Normal left atrial size and moderate right atrial enlargement,
with LAESV 37 mL/m2 and RAESV 53 mL/m2.

4. Normal size of the aortic root and main, left, and right
pulmonary arteries. Ascending aorta measures 38 mm; mild dilation
for age and BSA.

5.  Abnormalities as below:

Tricuspid Valve: Qualitatively trivial regurgitation

Mitral Valve: Qualitatively trivial regurgitation

Aortic Valve: Mild-to-moderate aortic regurgitation, regurgitation
fraction 35%

Pulmonic Valve: Severe pulmonic regurgitation, regurgitation
fraction 60%, valve thickening noted, maximum velocity 3.29 m/s
consistent with moderate pulmonic stenosis

Atrial septal defects: There is a left atrial to right atrial
communication with velocity decoding from left to right consistent
with an ASD. This is best seen on the four chamber stack series.

VSD: There is evidence of an perimembranous VSD with velocity
decoding consistent with residual left to right flow.

Qp/Qs:

6.  Normal pericardium.  Trivial pericardial effusion.

7. Grossly, no extracardiac findings; evidence of prior sternotomy
noted. Recommended dedicated study if concerned for non-cardiac
pathology.

8.  Breathhold artifact noted.
IMPRESSION: 1. Small left ventricular size.

2. Mild left ventricular systolic dysfunction (LVEF =50%).

3. Mild right ventricular enlargement with RVEDVI 104 mL/m2.

4. Normal right ventricular systolic function (RVEF =50%).

5. There is no evidence of RV thrombus.

6. Moderate right atrial enlargement

7. Ascending aorta measures 38 mm; mild dilation for age and BSA.

8. Mild-to-moderate aortic regurgitation.

9. Severe pulmonic regurgitation with regurgitation fraction 60% and
valve thickening noted. Moderate pulmonic stenosis

10. Atrial septal defect incidentally noted.

11.  Perimembranous VSD patch with residual flow

12. Trivial pericardial effusion.

## 2023-03-01 ENCOUNTER — Other Ambulatory Visit: Payer: Self-pay | Admitting: Family Medicine

## 2023-03-04 ENCOUNTER — Other Ambulatory Visit: Payer: Self-pay | Admitting: Family Medicine

## 2023-03-06 ENCOUNTER — Other Ambulatory Visit: Payer: Self-pay | Admitting: Family Medicine

## 2023-03-24 ENCOUNTER — Other Ambulatory Visit: Payer: Self-pay | Admitting: Family Medicine

## 2023-03-26 ENCOUNTER — Ambulatory Visit: Payer: Medicare Other | Admitting: Family Medicine

## 2023-03-26 ENCOUNTER — Encounter: Payer: Self-pay | Admitting: Family Medicine

## 2023-03-26 VITALS — BP 149/76 | HR 64 | Ht 65.0 in | Wt 129.4 lb

## 2023-03-26 DIAGNOSIS — G4700 Insomnia, unspecified: Secondary | ICD-10-CM

## 2023-03-26 DIAGNOSIS — M545 Low back pain, unspecified: Secondary | ICD-10-CM | POA: Diagnosis not present

## 2023-03-26 DIAGNOSIS — I10 Essential (primary) hypertension: Secondary | ICD-10-CM

## 2023-03-26 DIAGNOSIS — E785 Hyperlipidemia, unspecified: Secondary | ICD-10-CM | POA: Diagnosis not present

## 2023-03-26 DIAGNOSIS — G8929 Other chronic pain: Secondary | ICD-10-CM

## 2023-03-26 MED ORDER — ZOLPIDEM TARTRATE 5 MG PO TABS: 5.0000 mg | ORAL_TABLET | Freq: Every evening | ORAL | 5 refills | Status: DC | PRN

## 2023-03-26 MED ORDER — ATORVASTATIN CALCIUM 10 MG PO TABS: 10.0000 mg | ORAL_TABLET | Freq: Every day | ORAL | 3 refills | Status: AC

## 2023-03-26 NOTE — Patient Instructions (Signed)
 It was great to see you again today.  Checking kidney function today Refilled zolpidem  Try to keep staying active Check blood pressure several times per week, let me know if >140/90 Change crestor  (rosuvastatin ) to lipitor (atorvastatin ) Follow up with me in 6 months, sooner if needed  Be well, Dr. Donah

## 2023-03-26 NOTE — Progress Notes (Signed)
  Date of Visit: 03/26/2023   SUBJECTIVE:   HPI:  Marie Harvey presents today for routine follow-up.  Hypertension: Currently taking valsartan  80 mg daily.  Tolerating this well.  Has not been checking blood pressure at home but says she could.  She recently moved to [facility] [place] [facility] and has been having a great time living there, made lots of good friends.  Has sometimes not taken her medication because she is having so much fun and forgets.  Hyperlipidemia: Currently taking Crestor  5 mg daily.  Endorses severe heartburn whenever she takes the Crestor .  Wants to try switching to something else.  Insomnia: Requests refill of her Ambien .  She takes 5 mg nightly.  Has good results with this and is able to sleep.  Without it she would not be able to sleep well.  Denies any adverse side effects from the medication.  Back pain: Works out 2-3 times per week Noticed maybe pulled a muscle in September in her lower back Next day felt severe pain after riding to beach in car for several hours Pain comes and goes since then.  No pain currently. Denies having fever, saddle anesthesia, lower extremity weakness, or problems with stooling or urination.   OBJECTIVE:   BP (!) 149/76   Pulse 64   Ht 5' 5 (1.651 m)   Wt 129 lb 6.4 oz (58.7 kg)   SpO2 97%   BMI 21.53 kg/m  Gen: no acute distress, pleasant, cooperative, well appearing HEENT: normocephalic, atraumatic  Heart: regular rate and rhythm  Lungs: clear to auscultation bilaterally, normal work of breathing  Neuro: alert, grossly nonfocal Ext: full strength bilateral lower extremities   ASSESSMENT/PLAN:   Assessment & Plan Primary hypertension BP elevated today, including on recheck She will check it at home and let me know if it is persistently over 140/90 Continue valsartan  at present dose Update BMET at future lab visit, no phlebotomist here today in our office Hyperlipidemia, unspecified hyperlipidemia type Not tolerating  Crestor .  Switch to atorvastatin  10 mg daily to see if she tolerates this better. Insomnia, unspecified type Stable, refill zolpidem  5 mg nightly Chronic midline low back pain, unspecified whether sciatica present No red flags, seems benign musculoskeletal pain.  Encouraged staying active, stretches, exercise. Follow up if worsening    FOLLOW UP: Follow up in 6 months with me for above issues  Ollie Delano J. Donah, MD [facility]

## 2023-03-26 NOTE — Progress Notes (Signed)
 Cardiology Office Note:    Date:  03/29/2023   ID:  Marie Harvey, DOB [birth year], MRN [id number]  PCP:  Donah Laymon PARAS, MD   [facility]  Cardiologist:  None  Advanced Practice Provider:  No care team member to display Electrophysiologist:  None   Referring MD: Donah Laymon PARAS, MD     History of Present Illness:    Marie Harvey is a 68 y.o. female with a hx of tetrology of fallot s/p repair 1973, GERD and depression who presents to clinic for follow-up.  Patient underwent repair in 1973 for tetralogy of fallot at [facility]. Was followed with congenital specialist for several years but had not seen a Cardiologist in 40 years. TTE 05/05/20 with LVEF 45-50%, possible small residual VSD, moderately enlarged RV with preserved RV function, d-shaped septum mild PS with mean gradient , peak gradient , severe PR; mild TR. Severely enlarged LA and RA, and hyperechoic structure seen at the apex of RV. Mildly dilated ascending aorta 36mm.  She subsequently underwent cardiac MR on 07/11/20 which demonstrated mild-to-moderate AR, severe PR, moderate PS, ASD, perimembranous VSD, Qp:Qs 1.37, LVEF 50%, mild RV enlargement with preserved RV systolic function, no RV thrombus, moderate RAE, mild ascending aortia dilation.  Saw Dr. Theotis in cardiology at [facility] on 06/10/20 where a Holter monitor was placed for palpitations. Planned for continued monitoring of her PR given relative lack of symptoms.  Returned to visit Dr. Theotis on 07/14/21 where she was doing well. She remained active without significant symptoms. Planned for continued watchful waiting.   Since last clinic visit, she reports she is doing okay.  Denies any exertional chest pain.  Does report she gets occasional dyspnea on exertion.  She denies any lightheadedness, syncope, lower extremity edema, or palpitations.  Past Medical History:  Diagnosis Date   Abnormal RBC    h/o. f/u by PCP    Endometrial hyperplasia without atypia, complex 02/2000   GERD (gastroesophageal reflux disease)    H/O cleft lip    Palate   H/O major depression    H/O tetralogy of Fallot repair 1973   Heart murmur    As a child   Osteoporosis     Past Surgical History:  Procedure Laterality Date   ABDOMINAL SURGERY     CLEFT LIP REPAIR     and palate as child   HERNIA REPAIR     TETRALOGY OF FALLOT REPAIR  1973    Current Medications: Current Meds  Medication Sig   apixaban  (ELIQUIS ) 5 MG TABS tablet Take 1 tablet (5 mg total) by mouth 2 (two) times daily.   [DISCONTINUED] valsartan  (DIOVAN ) 80 MG tablet Take 1 tablet (80 mg total) by mouth daily.     Allergies:   Codeine, Calcium -containing compounds, Penicillins, and Vitamin d  analogs   Social History   Socioeconomic History   Marital status: Single    Spouse name: Not on file   Number of children: 0   Years of education: 12   Highest education level: Some college, no degree  Occupational History   Occupation: CNA    Comment: [employer]  Tobacco Use   Smoking status: Never    Passive exposure: Never   Smokeless tobacco: Never  Vaping Use   Vaping status: Never Used  Substance and Sexual Activity   Alcohol use: No   Drug use: No   Sexual activity: Not Currently    Birth control/protection: None  Other Topics Concern  Not on file  Social History Narrative   Patient lives with her brother and his wife.    Patient has never been married.   No children.   CNA at [employer].   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   Enjoys readying.   Patient goes to the gym 3x per week- strength training and cardio on treadmill.    Social Drivers of Corporate Investment Banker Strain: Low Risk  (03/24/2023)   Overall Financial Resource Strain (CARDIA)    Difficulty of Paying Living Expenses: Not hard at all  Food Insecurity: No Food Insecurity (03/24/2023)   Hunger Vital Sign    Worried About Running Out of Food in the Last Year: Never true    Ran Out of  Food in the Last Year: Never true  Transportation Needs: No Transportation Needs (03/24/2023)   PRAPARE - Administrator, Civil Service (Medical): No    Lack of Transportation (Non-Medical): No  Physical Activity: Insufficiently Active (03/24/2023)   Exercise Vital Sign    Days of Exercise per Week: 2 days    Minutes of Exercise per Session: 30 min  Stress: Stress Concern Present (03/24/2023)   [employer] of Occupational Health - Occupational Stress Questionnaire    Feeling of Stress : To some extent  Social Connections: Moderately Integrated (03/24/2023)   Social Connection and Isolation Panel [NHANES]    Frequency of Communication with Friends and Family: Once a week    Frequency of Social Gatherings with Friends and Family: More than three times a week    Attends Religious Services: 1 to 4 times per year    Active Member of [employer] or Organizations: Yes    Attends Engineer, Structural: More than 4 times per year    Marital Status: Never married  Recent Concern: Social Connections - Moderately Isolated (01/29/2023)   Social Connection and Isolation Panel [NHANES]    Frequency of Communication with Friends and Family: Never    Frequency of Social Gatherings with Friends and Family: Never    Attends Religious Services: 1 to 4 times per year    Active Member of [employer] or Organizations: Yes    Attends Engineer, Structural: More than 4 times per year    Marital Status: Never married     Family History: The patient's family history includes Breast cancer (age of onset: 58) in her maternal aunt; Colon cancer in her mother; Stroke in her father; Uterine cancer in her mother. There is no history of Esophageal cancer.  ROS:   Please see the history of present illness.      EKGs/Labs/Other Studies Reviewed:    The following studies were reviewed today:  Holter Monitor 10/2020: Holter 11/08/2020: *The observed rhythms are sinus bradycardia to sinus  tachycardia. *The Maximum Heart Rate recorded was 138 bpm, Day 3 / 03:31:00 pm, the Minimum Heart Rate recorded was 47 bpm, Day 3 / 04:57:44 am and the Average Heart Rate was 80 bpm. *There were 356 PVCs with a burden of 0.1 %. *There were [phone] PSVCs with a burden of 6.85 %. There were 32 occurrences of Supraventricular Tachycardia with the longest episode 5 beats, Day 3 / 09:51:38 am and the fastest episode 124 bpm, Day 2 / 02:52:27 pm. *There were 0 Patient triggered events. Occasional supraventricular and ventricular ectopy with no sustained arrhythmias Normal heart rate range and variability  Cardiac MRI 07/08/20: FINDINGS: 1. Small left ventricular size, with LVEDD 50 mm, but LVEDVI 44.39 ml/m2.  Normal left ventricular thickness, with intraventricular septal thickness of 8 mm and posterior wall thickness of 7 mm.   Mild left ventricular systolic dysfunction (LVEF =50%). There are no regional wall motion abnormalities but global hypokinesis.   There is no late gadolinium enhancement in the left ventricular myocardium.   2. Mild right ventricular enlargement with RVEDVI 104 mL/m2.   RVEDV: * : 168.20 ml   RVESV: * : 84.05 ml   RVSV: * : 84.15 ml   RVEF: * : 50.03 %   RVCO: * : 5103.33 ml/min   RVCI: * : 3.16 l/min/m   HR: * : 60.6/min   Normal right ventricular thickness.   Normal right ventricular systolic function (RVEF =50%). There are no regional wall motion abnormalities or aneurysms.   There is no evidence of RV thrombus.   3. Normal left atrial size and moderate right atrial enlargement, with LAESV 37 mL/m2 and RAESV 53 mL/m2.   4. Normal size of the aortic root and main, left, and right pulmonary arteries. Ascending aorta measures 38 mm; mild dilation for age and BSA.   5.  Abnormalities as below:   Tricuspid Valve: Qualitatively trivial regurgitation   Mitral Valve: Qualitatively trivial regurgitation   Aortic Valve: Mild-to-moderate  aortic regurgitation, regurgitation fraction 35%   Pulmonic Valve: Severe pulmonic regurgitation, regurgitation fraction 60%, valve thickening noted, maximum velocity 3.29 m/s consistent with moderate pulmonic stenosis   Atrial septal defects: There is a left atrial to right atrial communication with velocity decoding from left to right consistent with an ASD. This is best seen on the four chamber stack series.   VSD: There is evidence of an perimembranous VSD with velocity decoding consistent with residual left to right flow.   Qp/Qs: 1.37   6.  Normal pericardium.  Trivial pericardial effusion.   7. Grossly, no extracardiac findings; evidence of prior sternotomy noted. Recommended dedicated study if concerned for non-cardiac pathology.   8.  Breathhold artifact noted.   IMPRESSION: 1. Small left ventricular size.   2. Mild left ventricular systolic dysfunction (LVEF =50%).   3. Mild right ventricular enlargement with RVEDVI 104 mL/m2.   4. Normal right ventricular systolic function (RVEF =50%).   5. There is no evidence of RV thrombus.   6. Moderate right atrial enlargement   7. Ascending aorta measures 38 mm; mild dilation for age and BSA.   8. Mild-to-moderate aortic regurgitation.   9. Severe pulmonic regurgitation with regurgitation fraction 60% and valve thickening noted. Moderate pulmonic stenosis   10. Atrial septal defect incidentally noted.   11.  Perimembranous VSD patch with residual flow   12. Trivial pericardial effusion.  TTE 05/05/20: 1. Patient is s/p repair of Tetrology of Fallot.   2. The patient is s/p VSD patch repair. Suspect a small residual,  restrictive VSD in the the membranous poriton of the septum with L-->R  shunting (best seen on clip 15).   3. The pulmonic valve appears thickened with at least moderately  decreased leaflet excursion. There is mild pulmonic stenosis with mean  gradient , peak gradient . There is severe  pulmonic valve  regurgitation.   4. The right ventricular size is moderately enlarged with preserved RV  systolic function. There is normal pulmonary artery systolic pressure.   5. There is a hyperechoic structure seen at the apex of the RV. May  represent trabeculations, however, thrombus is on the differential.  Fotunately, the RV systolic funciton appears preserved. Recommend definity  contrast vs  TEE for further evaluation.   6. Left ventricular ejection fraction, by estimation, is 45 to 50%. The  left ventricle has mildly decreased function. The left ventricle  demonstrates global hypokinesis.   7. The interventricular septum is flattened in diastole ('D' shaped left  ventricle), consistent with right ventricular volume overload.   8. Left atrial size was severely dilated.   9. Right atrial size was severely dilated.  10. The mitral valve is grossly normal. Trivial mitral valve  regurgitation.  11. The tricuspid valve is mildly thickened. There is mild tricuspid  regurgitation  12. The aortic valve is tricuspid. There is mild calcification of the  aortic valve. There is mild thickening of the aortic valve. Aortic valve  regurgitation is mild. Mild aortic valve sclerosis is present, with no  evidence of aortic valve stenosis.  13. Aortic dilatation noted. There is mild dilatation of the ascending  aorta, measuring 36 mm.  14. The inferior vena cava is normal in size with greater than 50%  respiratory variabilty, suggesting right atrial pressure of 3 mmHg.  15. Consider TEE for further evaluation of hyperechoic structure seen in  RV apex and further evaluation of the pulmonic valve.   Comparison(s): Compared to prior echo report in 2013, the pulmonic valve  regurgitation now appears severe with moderate dilation of the RV. There  is evidence of RA/RV pressure overload. The LVEF now appears to be around  50%. The patch repaired VSD is  visualized. There is a small, residual  membranous VSD suspected. Rest of  the changes in detailed report as above.     EKG:   03/28/2023: Atrial flutter, rate 67,   Recent Labs: 03/28/2023: ALT 11; BUN 19; Creatinine, Ser 0.69; Hemoglobin 16.2; Platelets 170; Potassium 5.8; Sodium 143; TSH 2.020   Recent Lipid Panel    Component Value Date/Time   CHOL 157 07/26/2022 1237   TRIG 69 07/26/2022 1237   HDL 62 07/26/2022 1237   CHOLHDL 2.5 07/26/2022 1237   CHOLHDL 2.6 05/25/2014 0843   VLDL 13 05/25/2014 0843   LDLCALC 81 07/26/2022 1237     Physical Exam:    VS:  BP (!) 185/86 (BP Location: Right Arm, Patient Position: Sitting, Cuff Size: Small)   Pulse 61   Ht 5' 5 (1.651 m)   Wt 127 lb 9.6 oz (57.9 kg)   BMI 21.23 kg/m     Wt Readings from Last 3 Encounters:  03/28/23 127 lb 9.6 oz (57.9 kg)  03/26/23 129 lb 6.4 oz (58.7 kg)  09/27/22 127 lb 9.6 oz (57.9 kg)     GEN:  Well nourished, well developed in no acute distress HEENT: Normal NECK: No JVD; No carotid bruits CARDIAC: RRR, 2/6 systolic murmur. No rubs or gallops RESPIRATORY:  Clear to auscultation without rales, wheezing or rhonchi  ABDOMEN: Soft, non-tender, non-distended MUSCULOSKELETAL:  No edema; No deformity  SKIN: Warm and dry NEUROLOGIC:  Alert and oriented x 3 PSYCHIATRIC:  Normal affect   ASSESSMENT:    1. Tetralogy of Fallot   2. Congenital heart defect   3. Atypical atrial flutter (HCC)   4. Severe pulmonary valve regurgitation   5. Primary hypertension   6. Hyperlipidemia, unspecified hyperlipidemia type      PLAN:    In order of problems listed above:  #Tetrology of Fallot s/p Repair: #Moderate RV enlargement: #Severe PR, moderate PS Cardiac MRI 06/2020 with LVEF 50%, mild RV enlargement with preserved systolic function, severe PR, moderate PS, ASD, small VSD,  mild-to-moderate AI. Now with slight worsening of DOE with inclines/stairs.  -Update cardiac MRI -Recommend follow-up with Dr. Theotis given worsening dyspnea.  Plan  had been for likely transcatheter pulmonary valve replacement if symptomatic, assuming candidate based on CTA  #Atrial flutter New diagnosis in clinic today, appears atypical atrial flutter with 3-1 conduction. -Rates appear controlled despite no AV nodal blockers.  Will check Zio patch x 3 days -CHA2DS2-VASc 3 (hypertension, age, female).  Start Eliquis  5 mg twice daily.  Check BMET, CBC -Plan follow-up in 3 weeks, if remains in atrial flutter we will plan for cardioversion once completes 3 weeks of anticoagulation  #HTN: Significantly elevated in clinic today -Increase valsartan  to 80 mg daily.  Asked to check BP twice daily for next 2 weeks and let us  know results  #HLD: -Continue crestor  5mg  daily.  LDL 81 on 07/26/2022  RTC in 3 weeks    Medication Adjustments/Labs and Tests Ordered: Current medicines are reviewed at length with the patient today.  Concerns regarding medicines are outlined above.   Orders Placed This Encounter  Procedures   MR CARDIAC MORPHOLOGY W WO CONTRAST   Comprehensive Metabolic Panel (CMET)   CBC w/Diff/Platelet   TSH   Specimen status report   LONG TERM MONITOR (3-14 DAYS)   EKG 12-Lead   Meds ordered this encounter  Medications   DISCONTD: valsartan  (DIOVAN ) 80 MG tablet    Sig: Take 1 tablet (80 mg total) by mouth daily.    Dispense:  90 tablet    Refill:  3   apixaban  (ELIQUIS ) 5 MG TABS tablet    Sig: Take 1 tablet (5 mg total) by mouth 2 (two) times daily.    Dispense:  180 tablet    Refill:  3   Patient Instructions  Medication Instructions:  Start Eliquis  5 mg twice a day Increase Valsartan  to 80 mg daily Stop Ibuprofen, No Nsaids Ok to take Tylenol as needed Continue all other medications *If you need a refill on your cardiac medications before your next appointment, please call your pharmacy*   Lab Work: Cmet,cbc,tsh today   Testing/Procedures: Zio 3 day heart monitor will be mailed to your home  Cardiac MRI will be  scheduled at [facility] after approved by insurance   Follow instructions below    Cardioversion scheduled at [facility] Monday 2/10  Arrive at 6:30 am with Dr.Leyli Kevorkian Follow instructions below   Follow-Up: At [facility], you and your health needs are our priority.  As part of our continuing mission to provide you with exceptional heart care, we have created designated Provider Care Teams.  These Care Teams include your primary Cardiologist (physician) and Advanced Practice Providers (APPs -  Physician Assistants and Nurse Practitioners) who all work together to provide you with the care you need, when you need it.  We recommend signing up for the patient portal called MyChart.  Sign up information is provided on this After Visit Summary.  MyChart is used to connect with patients for Virtual Visits (Telemedicine).  Patients are able to view lab/test results, encounter notes, upcoming appointments, etc.  Non-urgent messages can be sent to your provider as well.   To learn more about what you can do with MyChart, go to [email or web address].     Your next appointment:  Tue 2/4 at 11:00 am at [facility] office   Provider:  Dr.Madelline Eshbach   Check blood pressure twice a day and bring readings to appointment   Call and schedule appointment with  Dr.Fleming     Dear Marie Harvey  You are scheduled for a Cardioversion on Monday, February 10 with Dr. Kate.  Please arrive at the [facility] (Main Entrance A) at [facility]: [address] [place], [place] [address] at 6:30 AM (This time is 1 hour(s) before your procedure to ensure your preparation).   Free valet parking service is available. You will check in at ADMITTING.   *Please Note: You will receive a call the day before your procedure to confirm the appointment time. That time may have changed from the original time based on the schedule for that day.*   DIET:  Nothing to eat or drink after midnight  except a sip of water with medications (see medication instructions below)  MEDICATION INSTRUCTIONS: !!IF ANY NEW MEDICATIONS ARE STARTED AFTER TODAY, PLEASE NOTIFY YOUR PROVIDER AS SOON AS POSSIBLE!!  FYI: Medications such as Semaglutide (Ozempic, Wegovy), Tirzepatide (Mounjaro, Zepbound), Dulaglutide (Trulicity), etc (GLP1 agonists) AND Canagliflozin (Invokana), Dapagliflozin (Farxiga), Empagliflozin (Jardiance), Ertugliflozin (Steglatro), Bexagliflozin [employer]) or any combination with one of these drugs such as Invokamet (Canagliflozin/Metformin), Synjardy (Empagliflozin/Metformin), etc (SGLT2 inhibitors) must be held around the time of a procedure. This is not a comprehensive list of all of these drugs. Please review all of your medications and talk to your provider if you take any one of these. If you are not sure, ask your provider.   Continue taking your anticoagulant (blood thinner): EliquisApixaban (Eliquis ).  You will need to continue this after your procedure until you are told by your provider that it is safe to stop.        FYI:  For your safety, and to allow us  to monitor your vital signs accurately during the surgery/procedure we request: If you have artificial nails, gel coating, SNS etc, please have those removed prior to your surgery/procedure. Not having the nail coverings /polish removed may result in cancellation or delay of your surgery/procedure.  Your support person will be asked to wait in the waiting room during your procedure.  It is OK to have someone drop you off and come back when you are ready to be discharged.  You cannot drive after the procedure and will need someone to drive you home.  Bring your insurance cards.  *Special Note: Every effort is made to have your procedure done on time. Occasionally there are emergencies that occur at the hospital that may cause delays. Please be patient if a delay does occur.              Signed, Lonni LITTIE Kate, MD  03/29/2023 1:06 PM    [facility]

## 2023-03-28 ENCOUNTER — Ambulatory Visit: Payer: Medicare Other | Attending: Cardiology | Admitting: Cardiology

## 2023-03-28 ENCOUNTER — Ambulatory Visit (INDEPENDENT_AMBULATORY_CARE_PROVIDER_SITE_OTHER): Payer: Medicare Other

## 2023-03-28 ENCOUNTER — Other Ambulatory Visit: Payer: Self-pay | Admitting: *Deleted

## 2023-03-28 VITALS — BP 185/86 | HR 61 | Ht 65.0 in | Wt 127.6 lb

## 2023-03-28 DIAGNOSIS — I484 Atypical atrial flutter: Secondary | ICD-10-CM

## 2023-03-28 DIAGNOSIS — I10 Essential (primary) hypertension: Secondary | ICD-10-CM

## 2023-03-28 DIAGNOSIS — Q249 Congenital malformation of heart, unspecified: Secondary | ICD-10-CM

## 2023-03-28 DIAGNOSIS — E785 Hyperlipidemia, unspecified: Secondary | ICD-10-CM

## 2023-03-28 DIAGNOSIS — Q213 Tetralogy of Fallot: Secondary | ICD-10-CM | POA: Diagnosis not present

## 2023-03-28 DIAGNOSIS — I371 Nonrheumatic pulmonary valve insufficiency: Secondary | ICD-10-CM

## 2023-03-28 MED ORDER — APIXABAN 5 MG PO TABS: 5.0000 mg | ORAL_TABLET | Freq: Two times a day (BID) | ORAL | 3 refills | Status: DC

## 2023-03-28 MED ORDER — VALSARTAN 80 MG PO TABS: 80.0000 mg | ORAL_TABLET | Freq: Every day | ORAL | 3 refills | Status: DC

## 2023-03-28 NOTE — Progress Notes (Signed)
 Orders placed for Cardioversion 04/30/23 per verbal order by Dr Bjorn Pippin

## 2023-03-28 NOTE — Assessment & Plan Note (Signed)
 BP elevated today, including on recheck She will check it at home and let me know if it is persistently over 140/90 Continue valsartan at present dose Update BMET at future lab visit, no phlebotomist here today in our office

## 2023-03-28 NOTE — Assessment & Plan Note (Signed)
 Not tolerating Crestor.  Switch to atorvastatin 10 mg daily to see if she tolerates this better.

## 2023-03-28 NOTE — Progress Notes (Unsigned)
 Enrolled for [employer] to mail a ZIO XT long term holter monitor to the patients address on file.

## 2023-03-28 NOTE — Patient Instructions (Addendum)
 Medication Instructions:  Start Eliquis  5 mg twice a day Increase Valsartan  to 80 mg daily Stop Ibuprofen, No Nsaids Ok to take Tylenol as needed Continue all other medications *If you need a refill on your cardiac medications before your next appointment, please call your pharmacy*   Lab Work: Cmet,cbc,tsh today   Testing/Procedures: Zio 3 day heart monitor will be mailed to your home  Cardiac MRI will be scheduled at [facility] after approved by insurance   Follow instructions below    Cardioversion scheduled at [facility] Monday 2/10  Arrive at 6:30 am with Dr.Schumann Follow instructions below   Follow-Up: At [facility], you and your health needs are our priority.  As part of our continuing mission to provide you with exceptional heart care, we have created designated Provider Care Teams.  These Care Teams include your primary Cardiologist (physician) and Advanced Practice Providers (APPs -  Physician Assistants and Nurse Practitioners) who all work together to provide you with the care you need, when you need it.  We recommend signing up for the patient portal called MyChart.  Sign up information is provided on this After Visit Summary.  MyChart is used to connect with patients for Virtual Visits (Telemedicine).  Patients are able to view lab/test results, encounter notes, upcoming appointments, etc.  Non-urgent messages can be sent to your provider as well.   To learn more about what you can do with MyChart, go to [email or web address].     Your next appointment:  Tue 2/4 at 11:00 am at [facility] office   Provider:  Dr.Schumann   Check blood pressure twice a day and bring readings to appointment   Call and schedule appointment with Dr.Fleming     Dear Marie Harvey  You are scheduled for a Cardioversion on Monday, February 10 with Dr. Kate.  Please arrive at the [facility] (Main Entrance A) at [facility]: [address]  [place], [place] [address] at 6:30 AM (This time is 1 hour(s) before your procedure to ensure your preparation).   Free valet parking service is available. You will check in at ADMITTING.   *Please Note: You will receive a call the day before your procedure to confirm the appointment time. That time may have changed from the original time based on the schedule for that day.*   DIET:  Nothing to eat or drink after midnight except a sip of water with medications (see medication instructions below)  MEDICATION INSTRUCTIONS: !!IF ANY NEW MEDICATIONS ARE STARTED AFTER TODAY, PLEASE NOTIFY YOUR PROVIDER AS SOON AS POSSIBLE!!  FYI: Medications such as Semaglutide (Ozempic, Wegovy), Tirzepatide (Mounjaro, Zepbound), Dulaglutide (Trulicity), etc (GLP1 agonists) AND Canagliflozin (Invokana), Dapagliflozin (Farxiga), Empagliflozin (Jardiance), Ertugliflozin (Steglatro), Bexagliflozin [employer]) or any combination with one of these drugs such as Invokamet (Canagliflozin/Metformin), Synjardy (Empagliflozin/Metformin), etc (SGLT2 inhibitors) must be held around the time of a procedure. This is not a comprehensive list of all of these drugs. Please review all of your medications and talk to your provider if you take any one of these. If you are not sure, ask your provider.   Continue taking your anticoagulant (blood thinner): EliquisApixaban (Eliquis ).  You will need to continue this after your procedure until you are told by your provider that it is safe to stop.        FYI:  For your safety, and to allow us  to monitor your vital signs accurately during the surgery/procedure we request: If you have artificial nails, gel coating, SNS  etc, please have those removed prior to your surgery/procedure. Not having the nail coverings /polish removed may result in cancellation or delay of your surgery/procedure.  Your support person will be asked to wait in the waiting room during your procedure.  It is OK to have  someone drop you off and come back when you are ready to be discharged.  You cannot drive after the procedure and will need someone to drive you home.  Bring your insurance cards.  *Special Note: Every effort is made to have your procedure done on time. Occasionally there are emergencies that occur at the hospital that may cause delays. Please be patient if a delay does occur.

## 2023-03-28 NOTE — Assessment & Plan Note (Signed)
 No red flags, seems benign musculoskeletal pain.  Encouraged staying active, stretches, exercise. Follow up if worsening

## 2023-03-28 NOTE — Assessment & Plan Note (Signed)
 Stable, refill zolpidem 5 mg nightly

## 2023-03-29 ENCOUNTER — Other Ambulatory Visit: Payer: Self-pay | Admitting: *Deleted

## 2023-03-29 ENCOUNTER — Telehealth: Payer: Self-pay | Admitting: *Deleted

## 2023-03-29 ENCOUNTER — Telehealth: Payer: Self-pay | Admitting: Physician Assistant

## 2023-03-29 ENCOUNTER — Telehealth: Payer: Self-pay | Admitting: Cardiology

## 2023-03-29 DIAGNOSIS — I10 Essential (primary) hypertension: Secondary | ICD-10-CM

## 2023-03-29 LAB — COMPREHENSIVE METABOLIC PANEL
ALT: 11 [IU]/L (ref 0–32)
AST: 18 [IU]/L (ref 0–40)
Albumin: 4.9 g/dL (ref 3.9–4.9)
Alkaline Phosphatase: 92 [IU]/L (ref 44–121)
BUN/Creatinine Ratio: 28 (ref 12–28)
BUN: 19 mg/dL (ref 8–27)
Bilirubin Total: 1 mg/dL (ref 0.0–1.2)
CO2: 24 mmol/L (ref 20–29)
Calcium: 10.2 mg/dL (ref 8.7–10.3)
Chloride: 103 mmol/L (ref 96–106)
Creatinine, Ser: 0.69 mg/dL (ref 0.57–1.00)
Globulin, Total: 2.3 g/dL (ref 1.5–4.5)
Glucose: 95 mg/dL (ref 70–99)
Potassium: 5.8 mmol/L (ref 3.5–5.2)
Sodium: 143 mmol/L (ref 134–144)
Total Protein: 7.2 g/dL (ref 6.0–8.5)
eGFR: 94 mL/min/{1.73_m2} (ref >59)

## 2023-03-29 LAB — CBC WITH DIFFERENTIAL/PLATELET
Basophils Absolute: 0.1 10*3/uL (ref 0.0–0.2)
Basos: 1 %
EOS (ABSOLUTE): 0.1 10*3/uL (ref 0.0–0.4)
Eos: 2 %
Hematocrit: 48.7 % — ABNORMAL HIGH (ref 34.0–46.6)
Hemoglobin: 16.2 g/dL — ABNORMAL HIGH (ref 11.1–15.9)
Immature Grans (Abs): 0 10*3/uL (ref 0.0–0.1)
Immature Granulocytes: 0 %
Lymphocytes Absolute: 1 10*3/uL (ref 0.7–3.1)
Lymphs: 19 %
MCH: 29.3 pg (ref 26.6–33.0)
MCHC: 33.3 g/dL (ref 31.5–35.7)
MCV: 88 fL (ref 79–97)
Monocytes Absolute: 0.4 10*3/uL (ref 0.1–0.9)
Monocytes: 8 %
Neutrophils Absolute: 3.6 10*3/uL (ref 1.4–7.0)
Neutrophils: 70 %
Platelets: 170 10*3/uL (ref 150–450)
RBC: 5.53 x10E6/uL — ABNORMAL HIGH (ref 3.77–5.28)
RDW: 13.1 % (ref 11.7–15.4)
WBC: 5.1 10*3/uL (ref 3.4–10.8)

## 2023-03-29 LAB — TSH: TSH: 2.02 u[IU]/mL (ref 0.450–4.500)

## 2023-03-29 LAB — SPECIMEN STATUS REPORT

## 2023-03-29 MED ORDER — AMLODIPINE BESYLATE 5 MG PO TABS: 5.0000 mg | ORAL_TABLET | Freq: Every day | ORAL | 3 refills | Status: DC

## 2023-03-29 NOTE — Telephone Encounter (Signed)
 Called and spoke to patient and patient verbalized she will be having lab work drawn today at [facility].

## 2023-03-29 NOTE — Telephone Encounter (Signed)
 [facility] is calling to report critical labs.   Call transferred to triage.

## 2023-03-29 NOTE — Telephone Encounter (Signed)
 Received sign out from overnight fellow that he fielded call from Lab about K 5.8. He asked the lab to re-run the sample. We have not yet been notified of those results. In the interim Dr. Kate has acted on the result in a result note to Frankey Pouch RN on this value. I will route to both of them to close the loop of communication here.

## 2023-03-29 NOTE — Telephone Encounter (Signed)
 Called and left patient a VM to stop Valsartan, start Amlodipine 5 mg daily and  labwork at  [facility] per Dr. Bjorn Pippin. Also made designated party, Day aware. Verbalized understanding.

## 2023-03-29 NOTE — Telephone Encounter (Signed)
 Spoke with [facility] regarding K+ 5.8 from yesterday   Dr Bjorn Pippin has already addressed and patient has been notified

## 2023-03-30 NOTE — Telephone Encounter (Signed)
 Called and spoke to patient and per patient she's not able to have labs drawn because she is working and weather. Patient verbalizes she will have labs drawn 04/02/2023. Made patient aware that provider wanted her to do repeat lab. Patient verbalized an understanding.

## 2023-04-02 ENCOUNTER — Other Ambulatory Visit (HOSPITAL_COMMUNITY)
Admission: RE | Admit: 2023-04-02 | Discharge: 2023-04-02 | Disposition: A | Discharge status: Home / Self Care | Payer: Medicare Other | Source: Ambulatory Visit | Attending: Cardiology | Admitting: Cardiology

## 2023-04-02 ENCOUNTER — Telehealth: Payer: Self-pay | Admitting: *Deleted

## 2023-04-02 DIAGNOSIS — I10 Essential (primary) hypertension: Secondary | ICD-10-CM | POA: Diagnosis not present

## 2023-04-02 LAB — BASIC METABOLIC PANEL
Anion gap: 10 (ref 5–15)
BUN: 18 mg/dL (ref 8–23)
CO2: 26 mmol/L (ref 22–32)
Calcium: 9.4 mg/dL (ref 8.9–10.3)
Chloride: 100 mmol/L (ref 98–111)
Creatinine, Ser: 0.83 mg/dL (ref 0.44–1.00)
GFR, Estimated: >60 mL/min (ref >60)
Glucose, Bld: 80 mg/dL (ref 70–99)
Potassium: 4.1 mmol/L (ref 3.5–5.1)
Sodium: 136 mmol/L (ref 135–145)

## 2023-04-02 NOTE — Telephone Encounter (Signed)
-----   Message from Lonni LITTIE Nanas sent at 03/29/2023  6:29 AM EST ----- Potassium significantly elevated.  Recommend stopping valsartan  and instead starting amlodipine  5 mg daily.  Would check BP daily and bring log to upcoming appointment.  Recommend repeat BMET tomorrow, would have drawn at [facility]

## 2023-04-02 NOTE — Telephone Encounter (Signed)
 Called and spoke to patient and patient verbalized after work she will be going to [facility] to have blood work done. Made patient aware for any other questions. Patient verbalized an understanding.

## 2023-04-03 ENCOUNTER — Encounter: Payer: Self-pay | Admitting: Family Medicine

## 2023-04-04 ENCOUNTER — Encounter: Payer: Self-pay | Admitting: Family Medicine

## 2023-04-04 ENCOUNTER — Other Ambulatory Visit: Payer: Self-pay | Admitting: Family Medicine

## 2023-04-04 ENCOUNTER — Other Ambulatory Visit: Payer: Medicare Other

## 2023-04-05 ENCOUNTER — Other Ambulatory Visit: Payer: Self-pay

## 2023-04-05 DIAGNOSIS — Q249 Congenital malformation of heart, unspecified: Secondary | ICD-10-CM | POA: Diagnosis not present

## 2023-04-05 DIAGNOSIS — Q213 Tetralogy of Fallot: Secondary | ICD-10-CM | POA: Diagnosis not present

## 2023-04-05 DIAGNOSIS — I484 Atypical atrial flutter: Secondary | ICD-10-CM | POA: Diagnosis not present

## 2023-04-10 ENCOUNTER — Encounter (HOSPITAL_BASED_OUTPATIENT_CLINIC_OR_DEPARTMENT_OTHER): Payer: Self-pay

## 2023-04-17 DIAGNOSIS — K219 Gastro-esophageal reflux disease without esophagitis: Secondary | ICD-10-CM | POA: Diagnosis not present

## 2023-04-17 DIAGNOSIS — K5904 Chronic idiopathic constipation: Secondary | ICD-10-CM | POA: Diagnosis not present

## 2023-04-19 DIAGNOSIS — Q213 Tetralogy of Fallot: Secondary | ICD-10-CM | POA: Diagnosis not present

## 2023-04-19 DIAGNOSIS — I484 Atypical atrial flutter: Secondary | ICD-10-CM | POA: Diagnosis not present

## 2023-04-24 ENCOUNTER — Telehealth (HOSPITAL_BASED_OUTPATIENT_CLINIC_OR_DEPARTMENT_OTHER): Payer: Self-pay | Admitting: *Deleted

## 2023-04-24 ENCOUNTER — Ambulatory Visit (HOSPITAL_BASED_OUTPATIENT_CLINIC_OR_DEPARTMENT_OTHER): Payer: Medicare Other | Admitting: Cardiology

## 2023-04-24 ENCOUNTER — Encounter (HOSPITAL_BASED_OUTPATIENT_CLINIC_OR_DEPARTMENT_OTHER): Payer: Self-pay | Admitting: Cardiology

## 2023-04-24 ENCOUNTER — Encounter (HOSPITAL_BASED_OUTPATIENT_CLINIC_OR_DEPARTMENT_OTHER): Payer: Self-pay | Admitting: *Deleted

## 2023-04-24 VITALS — BP 140/88 | HR 87 | Ht 65.0 in | Wt 126.9 lb

## 2023-04-24 DIAGNOSIS — Q213 Tetralogy of Fallot: Secondary | ICD-10-CM

## 2023-04-24 DIAGNOSIS — Q249 Congenital malformation of heart, unspecified: Secondary | ICD-10-CM | POA: Diagnosis not present

## 2023-04-24 DIAGNOSIS — I371 Nonrheumatic pulmonary valve insufficiency: Secondary | ICD-10-CM | POA: Diagnosis not present

## 2023-04-24 DIAGNOSIS — R079 Chest pain, unspecified: Secondary | ICD-10-CM

## 2023-04-24 DIAGNOSIS — I484 Atypical atrial flutter: Secondary | ICD-10-CM | POA: Diagnosis not present

## 2023-04-24 DIAGNOSIS — I10 Essential (primary) hypertension: Secondary | ICD-10-CM

## 2023-04-24 MED ORDER — AMLODIPINE BESYLATE 10 MG PO TABS: 10.0000 mg | ORAL_TABLET | Freq: Every day | ORAL | 3 refills | Status: AC

## 2023-04-24 NOTE — Progress Notes (Signed)
 This encounter was created in error - please disregard.

## 2023-04-24 NOTE — Progress Notes (Addendum)
 Cardiology Office Note:    Date:  04/24/2023   ID:  Marie Harvey, Marie Harvey 09/21/1955, MRN [id number]  PCP:  Donah Laymon PARAS, MD   [facility]  Cardiologist:  None  Advanced Practice Provider:  No care team member to display Electrophysiologist:  None   Referring MD: Donah Laymon PARAS, MD     History of Present Illness:    Marie Harvey is a 68 y.o. female with a hx of tetrology of fallot s/p repair 1973, GERD and depression who presents to clinic for follow-up.  Patient underwent repair in 1973 for tetralogy of fallot at [facility]. Was followed with congenital specialist for several years but had not seen a Cardiologist in 40 years. TTE 05/05/20 with LVEF 45-50%, possible small residual VSD, moderately enlarged RV with preserved RV function, d-shaped septum mild PS with mean gradient , peak gradient , severe PR; mild TR. Severely enlarged LA and RA, and hyperechoic structure seen at the apex of RV. Mildly dilated ascending aorta 36mm.  She subsequently underwent cardiac MR on 07/11/20 which demonstrated mild-to-moderate AR, severe PR, moderate PS, ASD, perimembranous VSD, Qp:Qs 1.37, LVEF 50%, mild RV enlargement with preserved RV systolic function, no RV thrombus, moderate RAE, mild ascending aortia dilation.  Saw Dr. Theotis in cardiology at [facility] on 06/10/20 where a Holter monitor was placed for palpitations. Planned for continued monitoring of her PR given relative lack of symptoms.  Returned to visit Dr. Theotis on 07/14/21 where she was doing well. She remained active without significant symptoms. Planned for continued watchful waiting.   Noted to be in atrial flutter in clinic visit 03/28/2023.  Zio patch x 3 days 03/2023 showed patient had converted to sinus rhythm, 5 episodes of SVT with longest lasting 11 beats, frequent PACs (7.6%), occasional supraventricular couplets 2.4%) and triplets (1.5%).  Since last clinic visit, she reports has  been doing okay.  States that shortness of breath has improved.  She denies any palpitations.  Does report she has been having some chest pain.  Reports can be related to exertion but also occurs at rest.  Brought home BP log, has been 120s to 160s.  Past Medical History:  Diagnosis Date   Abnormal RBC    h/o. f/u by PCP   Endometrial hyperplasia without atypia, complex 02/2000   GERD (gastroesophageal reflux disease)    H/O cleft lip    Palate   H/O major depression    H/O tetralogy of Fallot repair 1973   Heart murmur    As a child   Osteoporosis     Past Surgical History:  Procedure Laterality Date   ABDOMINAL SURGERY     CLEFT LIP REPAIR     and palate as child   HERNIA REPAIR     TETRALOGY OF FALLOT REPAIR  1973    Current Medications: Current Meds  Medication Sig   apixaban  (ELIQUIS ) 5 MG TABS tablet Take 1 tablet (5 mg total) by mouth 2 (two) times daily.   atorvastatin  (LIPITOR) 10 MG tablet Take 1 tablet (10 mg total) by mouth daily.   fluticasone  (FLONASE ) 50 MCG/ACT nasal spray Place 2 sprays into both nostrils daily. (Patient taking differently: Place 2 sprays into both nostrils daily as needed.)   latanoprost (XALATAN) 0.005 % ophthalmic solution PLACE 1 DROP INTO BOTH EYES NIGHTLY.   [DISCONTINUED] amLODipine  (NORVASC ) 5 MG tablet Take 1 tablet (5 mg total) by mouth daily.     Allergies:   Codeine, Calcium -containing  compounds, Penicillins, and Vitamin d  analogs   Social History   Socioeconomic History   Marital status: Single    Spouse name: Not on file   Number of children: 0   Years of education: 12   Highest education level: Some college, no degree  Occupational History   Occupation: CNA    Comment: [employer]  Tobacco Use   Smoking status: Never    Passive exposure: Never   Smokeless tobacco: Never  Vaping Use   Vaping status: Never Used  Substance and Sexual Activity   Alcohol use: No   Drug use: No   Sexual activity: Not Currently     Birth control/protection: None  Other Topics Concern   Not on file  Social History Narrative   Patient lives with her brother and his wife.    Patient has never been married.   No children.   CNA at [employer].   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   Enjoys readying.   Patient goes to the gym 3x per week- strength training and cardio on treadmill.    Social Drivers of Corporate Investment Banker Strain: Low Risk  (03/24/2023)   Overall Financial Resource Strain (CARDIA)    Difficulty of Paying Living Expenses: Not hard at all  Food Insecurity: No Food Insecurity (03/24/2023)   Hunger Vital Sign    Worried About Running Out of Food in the Last Year: Never true    Ran Out of Food in the Last Year: Never true  Transportation Needs: No Transportation Needs (03/24/2023)   PRAPARE - Administrator, Civil Service (Medical): No    Lack of Transportation (Non-Medical): No  Physical Activity: Insufficiently Active (03/24/2023)   Exercise Vital Sign    Days of Exercise per Week: 2 days    Minutes of Exercise per Session: 30 min  Stress: Stress Concern Present (03/24/2023)   [employer] of Occupational Health - Occupational Stress Questionnaire    Feeling of Stress : To some extent  Social Connections: Moderately Integrated (03/24/2023)   Social Connection and Isolation Panel [NHANES]    Frequency of Communication with Friends and Family: Once a week    Frequency of Social Gatherings with Friends and Family: More than three times a week    Attends Religious Services: 1 to 4 times per year    Active Member of [employer] or Organizations: Yes    Attends Engineer, Structural: More than 4 times per year    Marital Status: Never married  Recent Concern: Social Connections - Moderately Isolated (01/29/2023)   Social Connection and Isolation Panel [NHANES]    Frequency of Communication with Friends and Family: Never    Frequency of Social Gatherings with Friends and Family: Never    Attends Religious  Services: 1 to 4 times per year    Active Member of [employer] or Organizations: Yes    Attends Engineer, Structural: More than 4 times per year    Marital Status: Never married     Family History: The patient's family history includes Breast cancer (age of onset: 32) in her maternal aunt; Colon cancer in her mother; Stroke in her father; Uterine cancer in her mother. There is no history of Esophageal cancer.  ROS:   Please see the history of present illness.      EKGs/Labs/Other Studies Reviewed:    The following studies were reviewed today:  Holter Monitor 10/2020: Holter 11/08/2020: *The observed rhythms are sinus bradycardia to sinus tachycardia. *The  Maximum Heart Rate recorded was 138 bpm, Day 3 / 03:31:00 pm, the Minimum Heart Rate recorded was 47 bpm, Day 3 / 04:57:44 am and the Average Heart Rate was 80 bpm. *There were 356 PVCs with a burden of 0.1 %. *There were [id number] PSVCs with a burden of 6.85 %. There were 32 occurrences of Supraventricular Tachycardia with the longest episode 5 beats, Day 3 / 09:51:38 am and the fastest episode 124 bpm, Day 2 / 02:52:27 pm. *There were 0 Patient triggered events. Occasional supraventricular and ventricular ectopy with no sustained arrhythmias Normal heart rate range and variability  Cardiac MRI 07/08/20: FINDINGS: 1. Small left ventricular size, with LVEDD 50 mm, but LVEDVI 44.39 ml/m2.   Normal left ventricular thickness, with intraventricular septal thickness of 8 mm and posterior wall thickness of 7 mm.   Mild left ventricular systolic dysfunction (LVEF =50%). There are no regional wall motion abnormalities but global hypokinesis.   There is no late gadolinium enhancement in the left ventricular myocardium.   2. Mild right ventricular enlargement with RVEDVI 104 mL/m2.   RVEDV: * : 168.20 ml   RVESV: * : 84.05 ml   RVSV: * : 84.15 ml   RVEF: * : 50.03 %   RVCO: * : 5103.33 ml/min   RVCI: * : 3.16  l/min/m   HR: * : 60.6/min   Normal right ventricular thickness.   Normal right ventricular systolic function (RVEF =50%). There are no regional wall motion abnormalities or aneurysms.   There is no evidence of RV thrombus.   3. Normal left atrial size and moderate right atrial enlargement, with LAESV 37 mL/m2 and RAESV 53 mL/m2.   4. Normal size of the aortic root and main, left, and right pulmonary arteries. Ascending aorta measures 38 mm; mild dilation for age and BSA.   5.  Abnormalities as below:   Tricuspid Valve: Qualitatively trivial regurgitation   Mitral Valve: Qualitatively trivial regurgitation   Aortic Valve: Mild-to-moderate aortic regurgitation, regurgitation fraction 35%   Pulmonic Valve: Severe pulmonic regurgitation, regurgitation fraction 60%, valve thickening noted, maximum velocity 3.29 m/s consistent with moderate pulmonic stenosis   Atrial septal defects: There is a left atrial to right atrial communication with velocity decoding from left to right consistent with an ASD. This is best seen on the four chamber stack series.   VSD: There is evidence of an perimembranous VSD with velocity decoding consistent with residual left to right flow.   Qp/Qs: 1.37   6.  Normal pericardium.  Trivial pericardial effusion.   7. Grossly, no extracardiac findings; evidence of prior sternotomy noted. Recommended dedicated study if concerned for non-cardiac pathology.   8.  Breathhold artifact noted.   IMPRESSION: 1. Small left ventricular size.   2. Mild left ventricular systolic dysfunction (LVEF =50%).   3. Mild right ventricular enlargement with RVEDVI 104 mL/m2.   4. Normal right ventricular systolic function (RVEF =50%).   5. There is no evidence of RV thrombus.   6. Moderate right atrial enlargement   7. Ascending aorta measures 38 mm; mild dilation for age and BSA.   8. Mild-to-moderate aortic regurgitation.   9. Severe pulmonic  regurgitation with regurgitation fraction 60% and valve thickening noted. Moderate pulmonic stenosis   10. Atrial septal defect incidentally noted.   11.  Perimembranous VSD patch with residual flow   12. Trivial pericardial effusion.  TTE 05/05/20: 1. Patient is s/p repair of Tetrology of Fallot.   2. The patient is s/p VSD  patch repair. Suspect a small residual,  restrictive VSD in the the membranous poriton of the septum with L-->R  shunting (best seen on clip 15).   3. The pulmonic valve appears thickened with at least moderately  decreased leaflet excursion. There is mild pulmonic stenosis with mean  gradient , peak gradient . There is severe pulmonic valve  regurgitation.   4. The right ventricular size is moderately enlarged with preserved RV  systolic function. There is normal pulmonary artery systolic pressure.   5. There is a hyperechoic structure seen at the apex of the RV. May  represent trabeculations, however, thrombus is on the differential.  Fotunately, the RV systolic funciton appears preserved. Recommend definity  contrast vs TEE for further evaluation.   6. Left ventricular ejection fraction, by estimation, is 45 to 50%. The  left ventricle has mildly decreased function. The left ventricle  demonstrates global hypokinesis.   7. The interventricular septum is flattened in diastole ('D' shaped left  ventricle), consistent with right ventricular volume overload.   8. Left atrial size was severely dilated.   9. Right atrial size was severely dilated.  10. The mitral valve is grossly normal. Trivial mitral valve  regurgitation.  11. The tricuspid valve is mildly thickened. There is mild tricuspid  regurgitation  12. The aortic valve is tricuspid. There is mild calcification of the  aortic valve. There is mild thickening of the aortic valve. Aortic valve  regurgitation is mild. Mild aortic valve sclerosis is present, with no  evidence of aortic valve  stenosis.  13. Aortic dilatation noted. There is mild dilatation of the ascending  aorta, measuring 36 mm.  14. The inferior vena cava is normal in size with greater than 50%  respiratory variabilty, suggesting right atrial pressure of 3 mmHg.  15. Consider TEE for further evaluation of hyperechoic structure seen in  RV apex and further evaluation of the pulmonic valve.   Comparison(s): Compared to prior echo report in 2013, the pulmonic valve  regurgitation now appears severe with moderate dilation of the RV. There  is evidence of RA/RV pressure overload. The LVEF now appears to be around  50%. The patch repaired VSD is  visualized. There is a small, residual membranous VSD suspected. Rest of  the changes in detailed report as above.     EKG:   03/28/2023: Atrial flutter, rate 67 04/24/2023:    Recent Labs: 03/28/2023: ALT 11; Hemoglobin 16.2; Platelets 170; TSH 2.020 04/02/2023: BUN 18; Creatinine, Ser 0.83; Potassium 4.1; Sodium 136   Recent Lipid Panel    Component Value Date/Time   CHOL 157 07/26/2022 1237   TRIG 69 07/26/2022 1237   HDL 62 07/26/2022 1237   CHOLHDL 2.5 07/26/2022 1237   CHOLHDL 2.6 05/25/2014 0843   VLDL 13 05/25/2014 0843   LDLCALC 81 07/26/2022 1237     Physical Exam:    VS:  BP (!) 140/88 (BP Location: Left Arm, Patient Position: Sitting)   Pulse 87   Ht 5' 5 (1.651 m)   Wt 126 lb 14.4 oz (57.6 kg)   SpO2 98%   BMI 21.12 kg/m     Wt Readings from Last 3 Encounters:  04/24/23 126 lb 14.4 oz (57.6 kg)  03/28/23 127 lb 9.6 oz (57.9 kg)  03/26/23 129 lb 6.4 oz (58.7 kg)     GEN:  Well nourished, well developed in no acute distress HEENT: Normal NECK: No JVD; No carotid bruits CARDIAC: RRR, 2/6 systolic murmur. No rubs or gallops  RESPIRATORY:  Clear to auscultation without rales, wheezing or rhonchi  ABDOMEN: Soft, non-tender, non-distended MUSCULOSKELETAL:  No edema; No deformity  SKIN: Warm and dry NEUROLOGIC:  Alert and oriented x  3 PSYCHIATRIC:  Normal affect   ASSESSMENT:    1. Severe pulmonary valve regurgitation   2. Tetralogy of Fallot   3. Congenital heart defect   4. Atypical atrial flutter (HCC)   5. Primary hypertension   6. Chest pain of uncertain etiology       PLAN:    In order of problems listed above:  #Tetrology of Fallot s/p Repair: #Moderate RV enlargement: #Severe PR, moderate PS Cardiac MRI 06/2020 with LVEF 50%, mild RV enlargement with preserved systolic function, severe PR, moderate PS, ASD, small VSD, mild-to-moderate AI. Now with slight worsening of DOE with inclines/stairs.  -Update cardiac MRI.  Given chest pain/DOE, will plan stress MRI -Recommend follow-up with Dr. Theotis given worsening dyspnea.  Plan had been for likely transcatheter pulmonary valve replacement if symptomatic, assuming candidate based on CTA.  Has appointment with Dr. Theotis in April  #Atrial flutter New diagnosis in clinic 03/28/2023, appears atypical atrial flutter with 3-1 conduction. -Zio patch x 3 days 03/2023 showed patient had converted to sinus rhythm, 5 episodes of SVT with longest lasting 11 beats, frequent PACs (7.6%), occasional supraventricular couplets 2.4%) and triplets (1.5%). -Recommend EP referral -CHA2DS2-VASc 3 (hypertension, age, female).  Continue Eliquis  5 mg twice daily.    #HTN: Was on valsartan  but discontinued due to hyperkalemia.  Switched to amlodipine  5 mg daily.  BP elevated today in clinic and on home log, will increase to 10 mg daily.  Asked to check BP daily for next 2 weeks and let us  know results  #HLD: -Continue crestor  5mg  daily.  LDL 81 on 07/26/2022  RTC in 3 months  Informed Consent   Shared Decision Making/Informed Consent The risks [chest pain, shortness of breath, cardiac arrhythmias, dizziness, blood pressure fluctuations, myocardial infarction, stroke/transient ischemic attack, nausea, vomiting, allergic reaction, and life-threatening complications (estimated to  be 1 in 10,000)], benefits (risk stratification, diagnosing coronary artery disease, treatment guidance) and alternatives of a MRI stress test were discussed in detail with Ms. Minier and she agrees to proceed.         Medication Adjustments/Labs and Tests Ordered: Current medicines are reviewed at length with the patient today.  Concerns regarding medicines are outlined above.   Orders Placed This Encounter  Procedures   MR CARDIAC STRESS TEST   Ambulatory referral to Cardiac Electrophysiology   EKG 12-Lead   Meds ordered this encounter  Medications   amLODipine  (NORVASC ) 10 MG tablet    Sig: Take 1 tablet (10 mg total) by mouth daily.    Dispense:  90 tablet    Refill:  3    NEW DOSE, D/C 5 MG RX   Patient Instructions  Medication Instructions:  INCREASE AMLODIPINE  TO 10 MG DAILY   *If you need a refill on your cardiac medications before your next appointment, please call your pharmacy*  Lab Work: NONE  Testing/Procedures: Your physician has requested that you have a cardiac MRI. Cardiac MRI uses a computer to create images of your heart as its beating, producing both still and moving pictures of your heart and major blood vessels. For further information please visit [email or web address]. Please follow the instruction sheet given to you today for more information. STRESS CARDIAC MRI  WILL REACH OUT TO THE MRI TEAM TO SEE IF YOUR CURRENT SPOT WILL STILL  BE ABLE TO BE USED. IF NOT THEY WILL CALL YOU TO RESCHEDULE   Follow-Up: At [facility], you and your health needs are our priority.  As part of our continuing mission to provide you with exceptional heart care, we have created designated Provider Care Teams.  These Care Teams include your primary Cardiologist (physician) and Advanced Practice Providers (APPs -  Physician Assistants and Nurse Practitioners) who all work together to provide you with the care you need, when you need it.  We recommend signing up for the  patient portal called MyChart.  Sign up information is provided on this After Visit Summary.  MyChart is used to connect with patients for Virtual Visits (Telemedicine).  Patients are able to view lab/test results, encounter notes, upcoming appointments, etc.  Non-urgent messages can be sent to your provider as well.   To learn more about what you can do with MyChart, go to [email or web address].    Your next appointment:   3 month(s)  Provider:   Lonni Nanas, MD    Other Instructions MONITOR YOUR BLOOD PRESSURE DAILY FOR 2 WEEKS [facility] OR CALL WITH READINGS          Signed, Lonni LITTIE Nanas, MD  04/24/2023 12:55 PM    [facility]

## 2023-04-24 NOTE — Patient Instructions (Signed)
 Medication Instructions:  INCREASE AMLODIPINE  TO 10 MG DAILY   *If you need a refill on your cardiac medications before your next appointment, please call your pharmacy*  Lab Work: NONE  Testing/Procedures: Your physician has requested that you have a cardiac MRI. Cardiac MRI uses a computer to create images of your heart as its beating, producing both still and moving pictures of your heart and major blood vessels. For further information please visit [email or web address]. Please follow the instruction sheet given to you today for more information. STRESS CARDIAC MRI  WILL REACH OUT TO THE MRI TEAM TO SEE IF YOUR CURRENT SPOT WILL STILL BE ABLE TO BE USED. IF NOT THEY WILL CALL YOU TO RESCHEDULE   Follow-Up: At [facility], you and your health needs are our priority.  As part of our continuing mission to provide you with exceptional heart care, we have created designated Provider Care Teams.  These Care Teams include your primary Cardiologist (physician) and Advanced Practice Providers (APPs -  Physician Assistants and Nurse Practitioners) who all work together to provide you with the care you need, when you need it.  We recommend signing up for the patient portal called MyChart.  Sign up information is provided on this After Visit Summary.  MyChart is used to connect with patients for Virtual Visits (Telemedicine).  Patients are able to view lab/test results, encounter notes, upcoming appointments, etc.  Non-urgent messages can be sent to your provider as well.   To learn more about what you can do with MyChart, go to [email or web address].    Your next appointment:   3 month(s)  Provider:   Lonni Nanas, MD    Other Instructions MONITOR YOUR BLOOD PRESSURE DAILY FOR 2 WEEKS ANC MYCHART OR CALL WITH READINGS

## 2023-04-24 NOTE — Telephone Encounter (Signed)
Patient seen by Dr Bjorn Pippin today  Stress cardiac MRI order No attestation signed Forwarded to Dr Bjorn Pippin to sign

## 2023-04-30 ENCOUNTER — Encounter (HOSPITAL_COMMUNITY): Payer: Medicare Other

## 2023-04-30 ENCOUNTER — Ambulatory Visit (HOSPITAL_COMMUNITY): Admit: 2023-04-30 | Payer: Medicare Other | Admitting: Cardiology

## 2023-04-30 SURGERY — CARDIOVERSION (CATH LAB)
Anesthesia: Monitor Anesthesia Care

## 2023-05-16 ENCOUNTER — Telehealth: Payer: Self-pay | Admitting: Cardiology

## 2023-05-16 NOTE — Telephone Encounter (Signed)
 Pt dropped off BP report In provider box

## 2023-05-17 ENCOUNTER — Encounter: Payer: Self-pay | Admitting: *Deleted

## 2023-05-18 ENCOUNTER — Encounter (HOSPITAL_COMMUNITY): Payer: Self-pay

## 2023-05-18 ENCOUNTER — Other Ambulatory Visit (HOSPITAL_COMMUNITY): Payer: Self-pay | Admitting: *Deleted

## 2023-05-18 DIAGNOSIS — Z01810 Encounter for preprocedural cardiovascular examination: Secondary | ICD-10-CM

## 2023-05-22 ENCOUNTER — Telehealth (HOSPITAL_COMMUNITY): Payer: Self-pay | Admitting: Emergency Medicine

## 2023-05-22 NOTE — Telephone Encounter (Signed)
Attempted to call patient regarding upcoming cardiac MR appointment. Left message on voicemail with name and callback number Roberta Angell RN Navigator Cardiac Imaging [facility] [phone] Office [phone] Cell  

## 2023-05-23 ENCOUNTER — Other Ambulatory Visit (HOSPITAL_BASED_OUTPATIENT_CLINIC_OR_DEPARTMENT_OTHER): Payer: Self-pay | Admitting: Cardiology

## 2023-05-23 ENCOUNTER — Other Ambulatory Visit: Payer: Self-pay

## 2023-05-23 ENCOUNTER — Ambulatory Visit (HOSPITAL_COMMUNITY)
Admission: RE | Admit: 2023-05-23 | Discharge: 2023-05-23 | Disposition: A | Discharge status: Home / Self Care | Payer: Medicare Other | Source: Ambulatory Visit | Attending: Cardiology

## 2023-05-23 ENCOUNTER — Ambulatory Visit (HOSPITAL_COMMUNITY)
Admission: RE | Admit: 2023-05-23 | Discharge: 2023-05-23 | Disposition: A | Discharge status: Home / Self Care | Payer: Medicare Other | Source: Ambulatory Visit | Attending: Cardiology | Admitting: Cardiology

## 2023-05-23 DIAGNOSIS — Z01810 Encounter for preprocedural cardiovascular examination: Secondary | ICD-10-CM | POA: Insufficient documentation

## 2023-05-23 DIAGNOSIS — R079 Chest pain, unspecified: Secondary | ICD-10-CM | POA: Diagnosis not present

## 2023-05-23 DIAGNOSIS — I484 Atypical atrial flutter: Secondary | ICD-10-CM

## 2023-05-23 DIAGNOSIS — I371 Nonrheumatic pulmonary valve insufficiency: Secondary | ICD-10-CM

## 2023-05-23 MED ORDER — METOPROLOL TARTRATE 5 MG/5ML IV SOLN
INTRAVENOUS | Status: AC
  Filled 2023-05-23: qty 5

## 2023-05-23 MED ORDER — AMINOPHYLLINE 25 MG/ML IV SOLN
INTRAVENOUS | Status: AC
  Filled 2023-05-23: qty 10

## 2023-05-23 MED ORDER — REGADENOSON 0.4 MG/5ML IV SOLN
INTRAVENOUS | Status: AC
  Filled 2023-05-23: qty 5

## 2023-05-23 MED ORDER — REGADENOSON 0.4 MG/5ML IV SOLN
0.4000 mg | Freq: Once | INTRAVENOUS | Status: AC
  Administered 2023-05-23: 0.4 mg via INTRAVENOUS
  Filled 2023-05-23: qty 5

## 2023-05-23 MED ORDER — ALBUTEROL SULFATE HFA 108 (90 BASE) MCG/ACT IN AERS
INHALATION_SPRAY | RESPIRATORY_TRACT | Status: AC
  Filled 2023-05-23: qty 6.7

## 2023-05-23 MED ORDER — GADOBUTROL 1 MMOL/ML IV SOLN
8.0000 mL | Freq: Once | INTRAVENOUS | Status: AC | PRN
Start: 2023-05-23 — End: 2023-05-23
  Administered 2023-05-23: 8 mL via INTRAVENOUS

## 2023-05-23 MED ORDER — NITROGLYCERIN 0.4 MG SL SUBL
SUBLINGUAL_TABLET | SUBLINGUAL | Status: AC
  Filled 2023-05-23: qty 1

## 2023-05-23 NOTE — Progress Notes (Signed)
 Patient presents for a cardiac MRI stress test and tolerated procedure without incident. Patient maintained acceptable vital signs throughout the test, Dr. Bjorn Pippin at bedside during study.  Patient ambulated out of department with a steady gait.

## 2023-06-12 ENCOUNTER — Telehealth: Payer: Self-pay | Admitting: Cardiology

## 2023-06-12 NOTE — Progress Notes (Unsigned)
  This encounter was created in error - please disregard.

## 2023-06-12 NOTE — Telephone Encounter (Signed)
 Reviewed patient's EKGs with Dr Elberta Fortis.  Did not think she was in Aflutter, but rather sinus rhythm with PACs.  Can discontinue Eliquis.  Will cancel appointment tomorrow with Dr Elberta Fortis.  I spoke with patient and updated her on plan.

## 2023-06-13 ENCOUNTER — Ambulatory Visit: Payer: Medicare Other | Admitting: Cardiology

## 2023-07-12 ENCOUNTER — Encounter: Payer: Self-pay | Admitting: Cardiology

## 2023-07-12 DIAGNOSIS — I370 Nonrheumatic pulmonary valve stenosis: Secondary | ICD-10-CM | POA: Diagnosis not present

## 2023-07-12 DIAGNOSIS — Q213 Tetralogy of Fallot: Secondary | ICD-10-CM | POA: Diagnosis not present

## 2023-07-12 DIAGNOSIS — Z8774 Personal history of (corrected) congenital malformations of heart and circulatory system: Secondary | ICD-10-CM | POA: Diagnosis not present

## 2023-07-12 DIAGNOSIS — I371 Nonrheumatic pulmonary valve insufficiency: Secondary | ICD-10-CM | POA: Diagnosis not present

## 2023-07-12 NOTE — Telephone Encounter (Signed)
 error

## 2023-07-16 ENCOUNTER — Encounter: Payer: Self-pay | Admitting: Family Medicine

## 2023-07-16 ENCOUNTER — Ambulatory Visit: Admitting: Family Medicine

## 2023-07-16 VITALS — BP 131/79 | HR 88 | Ht 65.0 in | Wt 125.0 lb

## 2023-07-16 DIAGNOSIS — I10 Essential (primary) hypertension: Secondary | ICD-10-CM | POA: Diagnosis not present

## 2023-07-16 DIAGNOSIS — D582 Other hemoglobinopathies: Secondary | ICD-10-CM | POA: Diagnosis not present

## 2023-07-16 DIAGNOSIS — G4700 Insomnia, unspecified: Secondary | ICD-10-CM

## 2023-07-16 DIAGNOSIS — M810 Age-related osteoporosis without current pathological fracture: Secondary | ICD-10-CM | POA: Diagnosis not present

## 2023-07-16 MED ORDER — ZOLPIDEM TARTRATE 5 MG PO TABS: 5.0000 mg | ORAL_TABLET | Freq: Every evening | ORAL | 5 refills | Status: DC | PRN

## 2023-07-16 NOTE — Assessment & Plan Note (Signed)
Well controlled, continue amlodipine.

## 2023-07-16 NOTE — Progress Notes (Signed)
  Date of Visit: 07/16/2023   SUBJECTIVE:   HPI:  Marie "[name]" presents today for follow-up.  Hypertension: Currently taking amlodipine  10 mg daily.  Takes this medicines most days, sometimes skips it.  Insomnia: Currently taking Ambien  5 mg at bedtime.  It is working less well than it used to.  Does not want to add any additional medications right now.  Osteoporosis: Brought this up with patient, she does not want to undergo any additional treatments for this or update her DEXA scan at this time.  Elevated hemoglobin: Desires to recheck her CBC today.  She is worried she could have polycythemia.  Of note she is following with cardiology for her tetralogy of Fallot status post repair.  Has upcoming CT scan to evaluate replacement of cardiac valve.  OBJECTIVE:   BP 131/79   Pulse 88   Ht 5\' 5"  (1.651 m)   Wt 125 lb (56.7 kg)   SpO2 96%   BMI 20.80 kg/m  Gen: No acute distress, pleasant, cooperative HEENT: Normocephalic, atraumatic Heart: Regular rate and rhythm, 3 out of 6 to 4 out of 6 systolic murmur loudest at right upper sternal border Lungs: Clear bilaterally, normal effort Neuro: Grossly nonfocal, speech normal Ext: No edema  ASSESSMENT/PLAN:   Assessment & Plan Elevated hemoglobin (HCC) Update CBC with differential today Suspect any elevation in hemoglobin may be related to her underlying cardiac issues, pending results will discuss with patient what to do next (consider referral to hematology) Osteoporosis without current pathological fracture, unspecified osteoporosis type Deferring per patient request Discussed risks of fracture if she were to fall, she understands this Consider further evaluation with updating DEXA or other treatments in the future if patient desires Insomnia, unspecified type Overall stable.  Discussed option of adding mirtazapine  or trazodone  to help with sleep She desires to continue with Ambien  Refilled, PDMP appropriate Primary  hypertension Well-controlled, continue amlodipine    Note at end of visit patient also mentioned occasional headache that she thinks is related to allergies, not worsening or persistent.  Advised follow-up if worsening or increasing so we can discuss in more detail.  [place] J. Dawn Eth, MD [facility]

## 2023-07-16 NOTE — Patient Instructions (Signed)
 It was great to see you again today.  Checking blood counts Refilled ambien   Follow up in 6 months, sooner if needed  Be well, Dr. Dawn Eth

## 2023-07-16 NOTE — Assessment & Plan Note (Signed)
 Overall stable.  Discussed option of adding mirtazapine  or trazodone  to help with sleep She desires to continue with Ambien  Refilled, PDMP appropriate

## 2023-07-16 NOTE — Assessment & Plan Note (Signed)
 Deferring per patient request Discussed risks of fracture if she were to fall, she understands this Consider further evaluation with updating DEXA or other treatments in the future if patient desires

## 2023-07-17 ENCOUNTER — Encounter: Payer: Self-pay | Admitting: Family Medicine

## 2023-07-17 LAB — CBC WITH DIFFERENTIAL/PLATELET
Basophils Absolute: 0 10*3/uL (ref 0.0–0.2)
Basos: 1 %
EOS (ABSOLUTE): 0.1 10*3/uL (ref 0.0–0.4)
Eos: 2 %
Hematocrit: 44.4 % (ref 34.0–46.6)
Hemoglobin: 14.6 g/dL (ref 11.1–15.9)
Immature Grans (Abs): 0 10*3/uL (ref 0.0–0.1)
Immature Granulocytes: 0 %
Lymphocytes Absolute: 1.2 10*3/uL (ref 0.7–3.1)
Lymphs: 19 %
MCH: 29.9 pg (ref 26.6–33.0)
MCHC: 32.9 g/dL (ref 31.5–35.7)
MCV: 91 fL (ref 79–97)
Monocytes Absolute: 0.4 10*3/uL (ref 0.1–0.9)
Monocytes: 7 %
Neutrophils Absolute: 4.3 10*3/uL (ref 1.4–7.0)
Neutrophils: 71 %
Platelets: 186 10*3/uL (ref 150–450)
RBC: 4.88 x10E6/uL (ref 3.77–5.28)
RDW: 13 % (ref 11.7–15.4)
WBC: 6 10*3/uL (ref 3.4–10.8)

## 2023-07-30 ENCOUNTER — Telehealth: Payer: Self-pay | Admitting: Cardiology

## 2023-07-30 NOTE — Telephone Encounter (Signed)
 Called DPR, Ray and made him aware that someone will reach out to him after patient visit and go over AVS.

## 2023-07-30 NOTE — Telephone Encounter (Signed)
 Brother (Day) stated he will not be able to accompany the patient to her next visit and wants a call back after patient's visit on 5/14 to discuss next steps.

## 2023-07-31 NOTE — Progress Notes (Deleted)
 Cardiology Office Note:    Date:  07/31/2023   ID:  Marie Harvey, DOB April 06, 1955, MRN [id number]  PCP:  Marie Cha, MD   [facility]  Cardiologist:  Wendie Hamburg, MD  Advanced Practice Provider:  No care team member to display Electrophysiologist:  None   Referring MD: Marie Cha, MD     History of Present Illness:    Marie Harvey is a 68 y.o. female with a hx of tetrology of fallot s/p repair 1973, GERD and depression who presents to clinic for follow-up.  Patient underwent repair in 1973 for tetralogy of fallot at [facility]. Was followed with congenital specialist for several years but had not seen a Cardiologist in 40 years. TTE 05/05/20 with LVEF 45-50%, possible small residual VSD, moderately enlarged RV with preserved RV function, d-shaped septum mild PS with mean gradient , peak gradient , severe PR; mild TR. Severely enlarged LA and RA, and hyperechoic structure seen at the apex of RV. Mildly dilated ascending aorta 36mm.  She subsequently underwent cardiac MR on 07/11/20 which demonstrated mild-to-moderate AR, severe PR, moderate PS, ASD, perimembranous VSD, Qp:Qs 1.37, LVEF 50%, mild RV enlargement with preserved RV systolic function, no RV thrombus, moderate RAE, mild ascending aortia dilation.  Saw Dr. Jamal Mays in cardiology at [facility] on 06/10/20 where a Holter monitor was placed for palpitations. Planned for continued monitoring of her PR given relative lack of symptoms.  Returned to visit Dr. Jamal Mays on 07/14/21 where she was doing well. She remained active without significant symptoms. Planned for continued watchful waiting.   Zio patch x 3 days 03/2023 showed 5 episodes of SVT with longest lasting 11 beats, frequent PACs (7.6%), occasional supraventricular couplets 2.4%) and triplets (1.5%).  Stress CMR 05/28/2023 showed no evidence of ischemia, status post Tetralogy of Fallot repair with transannular patch  with severe pulmonary regurgitation (regurgitant fraction 49%), status post VSD repair with no evidence of significant residual shunt (QP/QS 1.1), LVEF 56%, RVEF 50%, RV insertion site LGE, right sided aortic arch.  Since last clinic visit,  she reports has been doing okay.  States that shortness of breath has improved.  She denies any palpitations.  Does report she has been having some chest pain.  Reports can be related to exertion but also occurs at rest.  Brought home BP log, has been 120s to 160s.  Past Medical History:  Diagnosis Date   Abnormal RBC    h/o. f/u by PCP   Endometrial hyperplasia without atypia, complex 02/2000   GERD (gastroesophageal reflux disease)    H/O cleft lip    Palate   H/O major depression    H/O tetralogy of Fallot repair 1973   Heart murmur    As a child   Osteoporosis     Past Surgical History:  Procedure Laterality Date   ABDOMINAL SURGERY     CLEFT LIP REPAIR     and palate as child   HERNIA REPAIR     TETRALOGY OF FALLOT REPAIR  1973    Current Medications: No outpatient medications have been marked as taking for the 08/01/23 encounter (Appointment) with Wendie Hamburg, MD.     Allergies:   Codeine, Calcium -containing compounds, Penicillins, and Vitamin d  analogs   Social History   Socioeconomic History   Marital status: Single    Spouse name: Not on file   Number of children: 0   Years of education: 12   Highest education level: Some  college, no degree  Occupational History   Occupation: CNA    Comment: [employer]  Tobacco Use   Smoking status: Never    Passive exposure: Never   Smokeless tobacco: Never  Vaping Use   Vaping status: Never Used  Substance and Sexual Activity   Alcohol use: No   Drug use: No   Sexual activity: Not Currently    Birth control/protection: None  Other Topics Concern   Not on file  Social History Narrative   Patient lives with her brother and his wife.    Patient has never been  married.   No children.   CNA at [employer].   Cat Misty.   Enjoys readying.   Patient goes to the gym 3x per week- strength training and cardio on treadmill.    Social Drivers of Corporate investment banker Strain: Low Risk  (03/24/2023)   Overall Financial Resource Strain (CARDIA)    Difficulty of Paying Living Expenses: Not hard at all  Food Insecurity: No Food Insecurity (03/24/2023)   Hunger Vital Sign    Worried About Running Out of Food in the Last Year: Never true    Ran Out of Food in the Last Year: Never true  Transportation Needs: No Transportation Needs (03/24/2023)   PRAPARE - Administrator, Civil Service (Medical): No    Lack of Transportation (Non-Medical): No  Physical Activity: Insufficiently Active (03/24/2023)   Exercise Vital Sign    Days of Exercise per Week: 2 days    Minutes of Exercise per Session: 30 min  Stress: Stress Concern Present (03/24/2023)   [employer] of Occupational Health - Occupational Stress Questionnaire    Feeling of Stress : To some extent  Social Connections: Moderately Integrated (03/24/2023)   Social Connection and Isolation Panel [NHANES]    Frequency of Communication with Friends and Family: Once a week    Frequency of Social Gatherings with Friends and Family: More than three times a week    Attends Religious Services: 1 to 4 times per year    Active Member of [employer] or Organizations: Yes    Attends Engineer, structural: More than 4 times per year    Marital Status: Never married  Recent Concern: Social Connections - Moderately Isolated (01/29/2023)   Social Connection and Isolation Panel [NHANES]    Frequency of Communication with Friends and Family: Never    Frequency of Social Gatherings with Friends and Family: Never    Attends Religious Services: 1 to 4 times per year    Active Member of [employer] or Organizations: Yes    Attends Engineer, structural: More than 4 times per year    Marital Status:  Never married     Family History: The patient's family history includes Breast cancer (age of onset: 4) in her maternal aunt; Colon cancer in her mother; Stroke in her father; Uterine cancer in her mother. There is no history of Esophageal cancer.  ROS:   Please see the history of present illness.      EKGs/Labs/Other Studies Reviewed:    The following studies were reviewed today:  Holter Monitor 10/2020: Holter 11/08/2020: *The observed rhythms are sinus bradycardia to sinus tachycardia. *The Maximum Heart Rate recorded was 138 bpm, Day 3 / 03:31:00 pm, the Minimum Heart Rate recorded was 47 bpm, Day 3 / 04:57:44 am and the Average Heart Rate was 80 bpm. *There were 356 PVCs with a burden of 0.1 %. *There were  40981 PSVCs with a burden of 6.85 %. There were 32 occurrences of Supraventricular Tachycardia with the longest episode 5 beats, Day 3 / 09:51:38 am and the fastest episode 124 bpm, Day 2 / 02:52:27 pm. *There were 0 Patient triggered events. Occasional supraventricular and ventricular ectopy with no sustained arrhythmias Normal heart rate range and variability  Cardiac MRI 07/08/20: FINDINGS: 1. Small left ventricular size, with LVEDD 50 mm, but LVEDVI 44.39 ml/m2.   Normal left ventricular thickness, with intraventricular septal thickness of 8 mm and posterior wall thickness of 7 mm.   Mild left ventricular systolic dysfunction (LVEF =50%). There are no regional wall motion abnormalities but global hypokinesis.   There is no late gadolinium enhancement in the left ventricular myocardium.   2. Mild right ventricular enlargement with RVEDVI 104 mL/m2.   RVEDV: * : 168.20 ml   RVESV: * : 84.05 ml   RVSV: * : 84.15 ml   RVEF: * : 50.03 %   RVCO: * : 5103.33 ml/min   RVCI: * : 3.16 l/min/m   HR: * : 60.6/min   Normal right ventricular thickness.   Normal right ventricular systolic function (RVEF =50%). There are no regional wall motion  abnormalities or aneurysms.   There is no evidence of RV thrombus.   3. Normal left atrial size and moderate right atrial enlargement, with LAESV 37 mL/m2 and RAESV 53 mL/m2.   4. Normal size of the aortic root and main, left, and right pulmonary arteries. Ascending aorta measures 38 mm; mild dilation for age and BSA.   5.  Abnormalities as below:   Tricuspid Valve: Qualitatively trivial regurgitation   Mitral Valve: Qualitatively trivial regurgitation   Aortic Valve: Mild-to-moderate aortic regurgitation, regurgitation fraction 35%   Pulmonic Valve: Severe pulmonic regurgitation, regurgitation fraction 60%, valve thickening noted, maximum velocity 3.29 m/s consistent with moderate pulmonic stenosis   Atrial septal defects: There is a left atrial to right atrial communication with velocity decoding from left to right consistent with an ASD. This is best seen on the four chamber stack series.   VSD: There is evidence of an perimembranous VSD with velocity decoding consistent with residual left to right flow.   Qp/Qs: 1.37   6.  Normal pericardium.  Trivial pericardial effusion.   7. Grossly, no extracardiac findings; evidence of prior sternotomy noted. Recommended dedicated study if concerned for non-cardiac pathology.   8.  Breathhold artifact noted.   IMPRESSION: 1. Small left ventricular size.   2. Mild left ventricular systolic dysfunction (LVEF =50%).   3. Mild right ventricular enlargement with RVEDVI 104 mL/m2.   4. Normal right ventricular systolic function (RVEF =50%).   5. There is no evidence of RV thrombus.   6. Moderate right atrial enlargement   7. Ascending aorta measures 38 mm; mild dilation for age and BSA.   8. Mild-to-moderate aortic regurgitation.   9. Severe pulmonic regurgitation with regurgitation fraction 60% and valve thickening noted. Moderate pulmonic stenosis   10. Atrial septal defect incidentally noted.   11.  Perimembranous  VSD patch with residual flow   12. Trivial pericardial effusion.  TTE 05/05/20: 1. Patient is s/p repair of Tetrology of Fallot.   2. The patient is s/p VSD patch repair. Suspect a small residual,  restrictive VSD in the the membranous poriton of the septum with L-->R  shunting (best seen on clip 15).   3. The pulmonic valve appears thickened with at least moderately  decreased leaflet excursion. There is mild  pulmonic stenosis with mean  gradient , peak gradient . There is severe pulmonic valve  regurgitation.   4. The right ventricular size is moderately enlarged with preserved RV  systolic function. There is normal pulmonary artery systolic pressure.   5. There is a hyperechoic structure seen at the apex of the RV. May  represent trabeculations, however, thrombus is on the differential.  Fotunately, the RV systolic funciton appears preserved. Recommend definity  contrast vs TEE for further evaluation.   6. Left ventricular ejection fraction, by estimation, is 45 to 50%. The  left ventricle has mildly decreased function. The left ventricle  demonstrates global hypokinesis.   7. The interventricular septum is flattened in diastole ('D' shaped left  ventricle), consistent with right ventricular volume overload.   8. Left atrial size was severely dilated.   9. Right atrial size was severely dilated.  10. The mitral valve is grossly normal. Trivial mitral valve  regurgitation.  11. The tricuspid valve is mildly thickened. There is mild tricuspid  regurgitation  12. The aortic valve is tricuspid. There is mild calcification of the  aortic valve. There is mild thickening of the aortic valve. Aortic valve  regurgitation is mild. Mild aortic valve sclerosis is present, with no  evidence of aortic valve stenosis.  13. Aortic dilatation noted. There is mild dilatation of the ascending  aorta, measuring 36 mm.  14. The inferior vena cava is normal in size with greater than 50%   respiratory variabilty, suggesting right atrial pressure of 3 mmHg.  15. Consider TEE for further evaluation of hyperechoic structure seen in  RV apex and further evaluation of the pulmonic valve.   Comparison(s): Compared to prior echo report in 2013, the pulmonic valve  regurgitation now appears severe with moderate dilation of the RV. There  is evidence of RA/RV pressure overload. The LVEF now appears to be around  50%. The patch repaired VSD is  visualized. There is a small, residual membranous VSD suspected. Rest of  the changes in detailed report as above.     EKG:   03/28/2023: Atrial flutter, rate 67 04/24/2023:    Recent Labs: 03/28/2023: ALT 11; TSH 2.020 04/02/2023: BUN 18; Creatinine, Ser 0.83; Potassium 4.1; Sodium 136 07/16/2023: Hemoglobin 14.6; Platelets 186   Recent Lipid Panel    Component Value Date/Time   CHOL 157 07/26/2022 1237   TRIG 69 07/26/2022 1237   HDL 62 07/26/2022 1237   CHOLHDL 2.5 07/26/2022 1237   CHOLHDL 2.6 05/25/2014 0843   VLDL 13 05/25/2014 0843   LDLCALC 81 07/26/2022 1237     Physical Exam:    VS:  There were no vitals taken for this visit.    Wt Readings from Last 3 Encounters:  07/16/23 125 lb (56.7 kg)  04/24/23 126 lb 14.4 oz (57.6 kg)  03/28/23 127 lb 9.6 oz (57.9 kg)     GEN:  Well nourished, well developed in no acute distress HEENT: Normal NECK: No JVD; No carotid bruits CARDIAC: RRR, 2/6 systolic murmur. No rubs or gallops RESPIRATORY:  Clear to auscultation without rales, wheezing or rhonchi  ABDOMEN: Soft, non-tender, non-distended MUSCULOSKELETAL:  No edema; No deformity  SKIN: Warm and dry NEUROLOGIC:  Alert and oriented x 3 PSYCHIATRIC:  Normal affect   ASSESSMENT:    No diagnosis found.     PLAN:    In order of problems listed above:  #Tetrology of Fallot s/p Repair: #Moderate RV enlargement: #Severe PR, moderate PS Cardiac MRI 06/2020 with LVEF  50%, mild RV enlargement with preserved systolic  function, severe PR, moderate PS, ASD, small VSD, mild-to-moderate AI. Now with slight worsening of DOE with inclines/stairs.   Stress CMR 05/28/2023 showed no evidence of ischemia, status post Tetralogy of Fallot repair with transannular patch with severe pulmonary regurgitation (regurgitant fraction 49%), status post VSD repair with no evidence of significant residual shunt (QP/QS 1.1), LVEF 56%, RVEF 50%, RV insertion site LGE, right sided aortic arch. - Seen by Dr. Jamal Mays 06/2023, CT ordered to evaluate anatomical suitability for transcatheter pulmonic valve replacement  # Sinus rhythm with PACs EKG initially felt to represent atrial flutter but on review with EP, appears sinus rhythm with PACs.  Zio patch x 3 days 03/2023 showed 5 episodes of SVT with longest lasting 11 beats, frequent PACs (7.6%), occasional supraventricular couplets 2.4%) and triplets (1.5%).  #HTN: On amlodipine  10 mg daily.  Previously on valsartan  but discontinued due to hyperkalemia  #HLD: -Continue crestor  5mg  daily.  LDL 81 on 07/26/2022  RTC in 3 months***    Medication Adjustments/Labs and Tests Ordered: Current medicines are reviewed at length with the patient today.  Concerns regarding medicines are outlined above.   No orders of the defined types were placed in this encounter.  No orders of the defined types were placed in this encounter.  There are no Patient Instructions on file for this visit.    Signed, Wendie Hamburg, MD  07/31/2023 10:47 PM    [facility]

## 2023-08-01 ENCOUNTER — Ambulatory Visit (HOSPITAL_BASED_OUTPATIENT_CLINIC_OR_DEPARTMENT_OTHER): Payer: Medicare Other | Admitting: Cardiology

## 2023-08-27 ENCOUNTER — Ambulatory Visit: Payer: Medicare Other

## 2023-08-27 VITALS — Ht 65.0 in | Wt 125.0 lb

## 2023-08-27 DIAGNOSIS — Z0000 Encounter for general adult medical examination without abnormal findings: Secondary | ICD-10-CM

## 2023-08-27 NOTE — Patient Instructions (Signed)
 Ms. Marie Harvey , Thank you for taking time out of your busy schedule to complete your Annual Wellness Visit with me. I enjoyed our conversation and look forward to speaking with you again next year. I, as well as your care team,  appreciate your ongoing commitment to your health goals. Please review the following plan we discussed and let me know if I can assist you in the future. Your Game plan/ To Do List    Referrals: If you haven't heard from the office you've been referred to, please reach out to them at the phone provided.   Follow up Visits: Next Medicare AWV with our clinical staff: 08/28/2024 at 10:30 a.m. with Nurse Health Advisor   Have you seen your provider in the last 6 months (3 months if uncontrolled diabetes)? Yes, last seen 07/16/2023 Next Office Visit with your provider: Patient will call to schedule follow up  Clinician Recommendations:  Aim for 30 minutes of exercise or brisk walking, 6-8 glasses of water, and 5 servings of fruits and vegetables each day.       This is a list of the screening recommended for you and due dates:  Health Maintenance  Topic Date Due   COVID-19 Vaccine (8 - Moderna risk 2024-25 season) 08/19/2023   Flu Shot  10/19/2023   Medicare Annual Wellness Visit  08/26/2024   Mammogram  01/23/2025   Colon Cancer Screening  08/20/2029   DTaP/Tdap/Td vaccine (4 - Td or Tdap) 04/24/2030   Pneumonia Vaccine  Completed   DEXA scan (bone density measurement)  Completed   Hepatitis C Screening  Completed   Zoster (Shingles) Vaccine  Completed   HPV Vaccine  Aged Out   Meningitis B Vaccine  Aged Out    Advanced directives: (Declined) Advance directive discussed with you today. Even though you declined this today, please call our office should you change your mind, and we can give you the proper paperwork for you to fill out. Advance Care Planning is important because it:  [x]  Makes sure you receive the medical care that is consistent with your values, goals,  and preferences  [x]  It provides guidance to your family and loved ones and reduces their decisional burden about whether or not they are making the right decisions based on your wishes.  Follow the link provided in your after visit summary or read over the paperwork we have mailed to you to help you started getting your Advance Directives in place. If you need assistance in completing these, please reach out to us  so that we can help you!  See attachments for Preventive Care and Fall Prevention Tips.

## 2023-08-27 NOTE — Progress Notes (Signed)
 Because this visit was a virtual/telehealth visit,  certain criteria was not obtained, such a blood pressure, CBG if applicable, and timed get up and go. Any medications not marked as "taking" were not mentioned during the medication reconciliation part of the visit. Any vitals not documented were not able to be obtained due to this being a telehealth visit or patient was unable to self-report a recent blood pressure reading due to a lack of equipment at home via telehealth. Vitals that have been documented are verbally provided by the patient.   Subjective:   Marie Harvey is a 68 y.o. who presents for a Medicare Wellness preventive visit.  As a reminder, Annual Wellness Visits don't include a physical exam, and some assessments may be limited, especially if this visit is performed virtually. We may recommend an in-person follow-up visit with your provider if needed.  Visit Complete: Virtual I connected with  [name] on 08/27/23 by a audio enabled telemedicine application and verified that I am speaking with the correct person using two identifiers.  Patient Location: Home  Provider Location: Office/Clinic  I discussed the limitations of evaluation and management by telemedicine. The patient expressed understanding and agreed to proceed.  Vital Signs: Because this visit was a virtual/telehealth visit, some criteria may be missing or patient reported. Any vitals not documented were not able to be obtained and vitals that have been documented are patient reported.  VideoDeclined- This patient declined Librarian, academic. Therefore the visit was completed with audio only.  Persons Participating in Visit: Patient.  AWV Questionnaire: No: Patient Medicare AWV questionnaire was not completed prior to this visit.  Cardiac Risk Factors include: advanced age (>8men, >26 women);dyslipidemia;family history of premature cardiovascular  disease;hypertension;sedentary lifestyle     Objective:     Today's Vitals   08/27/23 1032  Weight: 125 lb (56.7 kg)  Height: 5\' 5"  (1.651 m)  PainSc: 0-No pain   Body mass index is 20.8 kg/m.     08/27/2023   10:34 AM 07/16/2023   11:21 AM 07/20/2022    8:44 AM 07/06/2022    3:28 PM 02/02/2022    8:44 AM 12/22/2021    8:53 AM 11/15/2021    4:00 PM  Advanced Directives  Does Patient Have a Medical Advance Directive? Yes No No No No No No  Type of Estate agent of [place];Living will        Copy of Healthcare Power of Attorney in Chart? No - copy requested        Would patient like information on creating a medical advance directive? No - Patient declined No - Patient declined No - Patient declined No - Patient declined  No - Patient declined No - Patient declined    Current Medications (verified) Outpatient Encounter Medications as of 08/27/2023  Medication Sig   amLODipine  (NORVASC ) 10 MG tablet Take 1 tablet (10 mg total) by mouth daily.   atorvastatin  (LIPITOR) 10 MG tablet Take 1 tablet (10 mg total) by mouth daily.   Cholecalciferol  (D3 VITAMIN PO) Take 5,000 Units by mouth at bedtime.   fluticasone  (FLONASE ) 50 MCG/ACT nasal spray Place 2 sprays into both nostrils daily. (Patient taking differently: Place 2 sprays into both nostrils daily as needed.)   latanoprost (XALATAN) 0.005 % ophthalmic solution PLACE 1 DROP INTO BOTH EYES NIGHTLY.   zolpidem  (AMBIEN ) 5 MG tablet Take 1 tablet (5 mg total) by mouth at bedtime as needed. for sleep   No  facility-administered encounter medications on file as of 08/27/2023.    Allergies (verified) Codeine, Calcium -containing compounds, Penicillins, and Vitamin d  analogs   History: Past Medical History:  Diagnosis Date   Abnormal RBC    h/o. f/u by PCP   Endometrial hyperplasia without atypia, complex 02/2000   GERD (gastroesophageal reflux disease)    H/O cleft lip    Palate   H/O major depression    H/O  tetralogy of Fallot repair 1973   Heart murmur    As a child   Osteoporosis    Past Surgical History:  Procedure Laterality Date   ABDOMINAL SURGERY     CLEFT LIP REPAIR     and palate as child   HERNIA REPAIR     TETRALOGY OF FALLOT REPAIR  1973   Family History  Problem Relation Age of Onset   Uterine cancer Mother    Colon cancer Mother    Stroke Father    Breast cancer Maternal Aunt 20   Esophageal cancer Neg Hx    Social History   Socioeconomic History   Marital status: Single    Spouse name: Not on file   Number of children: 0   Years of education: 12   Highest education level: Some college, no degree  Occupational History   Occupation: CNA    Comment: [employer]  Tobacco Use   Smoking status: Never    Passive exposure: Never   Smokeless tobacco: Never  Vaping Use   Vaping status: Never Used  Substance and Sexual Activity   Alcohol use: No   Drug use: No   Sexual activity: Not Currently    Birth control/protection: None  Other Topics Concern   Not on file  Social History Narrative   Patient lives with her brother and his wife.    Patient has never been married.   No children.   CNA at [employer].   Cat [employer].   Enjoys readying.   Patient goes to the gym 3x per week- strength training and cardio on treadmill.    Social Drivers of Corporate investment banker Strain: Low Risk  (08/27/2023)   Overall Financial Resource Strain (CARDIA)    Difficulty of Paying Living Expenses: Not hard at all  Food Insecurity: No Food Insecurity (08/27/2023)   Hunger Vital Sign    Worried About Running Out of Food in the Last Year: Never true    Ran Out of Food in the Last Year: Never true  Transportation Needs: No Transportation Needs (08/27/2023)   PRAPARE - Administrator, Civil Service (Medical): No    Lack of Transportation (Non-Medical): No  Physical Activity: Insufficiently Active (08/27/2023)   Exercise Vital Sign    Days of Exercise per Week: 2 days     Minutes of Exercise per Session: 30 min  Stress: No Stress Concern Present (08/27/2023)   [employer] of Occupational Health - Occupational Stress Questionnaire    Feeling of Stress : Only a little  Social Connections: Moderately Integrated (08/27/2023)   Social Connection and Isolation Panel [NHANES]    Frequency of Communication with Friends and Family: Once a week    Frequency of Social Gatherings with Friends and Family: More than three times a week    Attends Religious Services: 1 to 4 times per year    Active Member of [employer] or Organizations: Yes    Attends Banker Meetings: More than 4 times per year    Marital Status:  Never married    Tobacco Counseling Counseling given: Not Answered    Clinical Intake:  Pre-visit preparation completed: Yes  Pain : No/denies pain Pain Score: 0-No pain     BMI - recorded: 20.8 Nutritional Status: BMI of 19-24  Normal Nutritional Risks: None Diabetes: No  No results found for: "HGBA1C"   How often do you need to have someone help you when you read instructions, pamphlets, or other written materials from your doctor or pharmacy?: 1 - Never What is the last grade level you completed in school?: SOME COLLEGE, NO DEGREE  Interpreter Needed?: No  Information entered by :: Reality Dejonge N. Karsten Howry, LPN.   Activities of Daily Living     08/27/2023   10:36 AM  In your present state of health, do you have any difficulty performing the following activities:  Hearing? 1  Comment HEARING AIDS  Vision? 0  Difficulty concentrating or making decisions? 0  Comment BSE: READING  Walking or climbing stairs? 0  Dressing or bathing? 0  Doing errands, shopping? 0  Preparing Food and eating ? N  Using the Toilet? N  In the past six months, have you accidently leaked urine? N  Do you have problems with loss of bowel control? N  Managing your Medications? N  Managing your Finances? N  Housekeeping or managing your Housekeeping?  N    Patient Care Team: Candee Cha, MD as PCP - General (Family Medicine) Wendie Hamburg, MD as PCP - Cardiology (Cardiology) Sonny Dust, MD (Inactive) as Consulting Physician (Cardiology) Bond, Muriel Arm, MD as Referring Physician (Ophthalmology) Asencion Blacksmith, MD (Inactive) as Consulting Physician (Gastroenterology) Burundi, Heather, OD as Consulting Physician (Optometry)  I have updated your Care Teams any recent Medical Services you may have received from other providers in the past year.     Assessment:    This is a routine wellness examination for Shamika.  Hearing/Vision screen Hearing Screening - Comments:: Patient has hearing impairment and wears hearing aids. Vision Screening - Comments:: Wears rx glasses - up to date with routine eye exams with Heather Burundi, OD.   Goals Addressed             This Visit's Progress    08/27/23: To maintain, stay independent and active.         Depression Screen     08/27/2023   10:38 AM 07/16/2023   11:21 AM 03/26/2023   10:09 AM 07/20/2022    8:44 AM 07/06/2022    3:18 PM 02/02/2022    8:45 AM 12/22/2021    8:52 AM  PHQ 2/9 Scores  PHQ - 2 Score 0 0 0 0 1 0 1  PHQ- 9 Score 0 3 3 4  3 6     Fall Risk     08/27/2023   10:36 AM 03/26/2023   10:09 AM 07/20/2022    8:44 AM 07/06/2022    3:19 PM 02/02/2022    8:45 AM  Fall Risk   Falls in the past year? 0 0 0 0 0  Number falls in past yr: 0 0 0 0   Injury with Fall? 0 0 0 0   Risk for fall due to : No Fall Risks   No Fall Risks   Follow up Falls evaluation completed   Falls evaluation completed     MEDICARE RISK AT HOME:  Medicare Risk at Home Any stairs in or around the home?: Yes (ELEVATORS) If so, are there any without handrails?:  No Home free of loose throw rugs in walkways, pet beds, electrical cords, etc?: Yes Adequate lighting in your home to reduce risk of falls?: Yes Life alert?: Yes Use of a cane, walker or w/c?: No Grab bars in the  bathroom?: Yes Shower chair or bench in shower?: No Elevated toilet seat or a handicapped toilet?: No  TIMED UP AND GO:  Was the test performed?  No  Cognitive Function: 6CIT completed    08/27/2023   10:34 AM  MMSE - Mini Mental State Exam  Not completed: Unable to complete        08/27/2023   10:54 AM 07/06/2022    3:24 PM 06/01/2021    3:58 PM  6CIT Screen  What Year? 0 points 0 points 0 points  What month? 0 points 0 points 0 points  What time? 0 points 0 points 0 points  Count back from 20 0 points 2 points 0 points  Months in reverse 0 points 0 points 0 points  Repeat phrase 0 points 0 points 0 points  Total Score 0 points 2 points 0 points    Immunizations Immunization History  Administered Date(s) Administered   Fluad Quad(high Dose 65+) 12/22/2021   Influenza Split 12/19/2010, 12/19/2011   Influenza Whole 12/20/2006, 12/30/2007, 12/22/2008, 12/22/2009   Influenza,inj,Quad PF,6+ Mos 12/20/2012, 12/25/2013   Influenza-Unspecified 01/19/2016, 01/03/2017, 01/15/2018, 01/14/2019, 01/06/2020, 01/01/2021, 12/21/2022   Moderna Sars-Covid-2 Vaccination 03/31/2019, 05/01/2019, 01/17/2020, 07/31/2020, 01/28/2022   PNEUMOCOCCAL CONJUGATE-20 12/15/2020   PPD Test 11/16/2015   Pfizer Covid-19 Vaccine Bivalent Booster 49yrs & up 01/14/2021   Pfizer(Comirnaty)Fall Seasonal Vaccine 12 years and older 02/18/2023   Pneumococcal Polysaccharide-23 02/17/1997, 02/20/2019   Td 08/19/1994, 10/16/2007   Tdap 04/24/2020   Zoster Recombinant(Shingrix ) 05/08/2018, 03/25/2021, 06/08/2021   Zoster, Live 05/19/2015    Screening Tests Health Maintenance  Topic Date Due   COVID-19 Vaccine (8 - Moderna risk 2024-25 season) 08/19/2023   INFLUENZA VACCINE  10/19/2023   Medicare Annual Wellness (AWV)  08/26/2024   MAMMOGRAM  01/23/2025   Colonoscopy  08/20/2029   DTaP/Tdap/Td (4 - Td or Tdap) 04/24/2030   Pneumonia Vaccine 50+ Years old  Completed   DEXA SCAN  Completed   Hepatitis C  Screening  Completed   Zoster Vaccines- Shingrix   Completed   HPV VACCINES  Aged Out   Meningococcal B Vaccine  Aged Out    Health Maintenance  Health Maintenance Due  Topic Date Due   COVID-19 Vaccine (8 - Moderna risk 2024-25 season) 08/19/2023   Health Maintenance Items Addressed: Yes    Additional Screening:  Vision Screening: Recommended annual ophthalmology exams for early detection of glaucoma and other disorders of the eye. Would you like a referral to an eye doctor? No    Dental Screening: Recommended annual dental exams for proper oral hygiene  Community Resource Referral / Chronic Care Management: CRR required this visit?  No   CCM required this visit?  No   Plan:    I have personally reviewed and noted the following in the patient's chart:   Medical and social history Use of alcohol, tobacco or illicit drugs  Current medications and supplements including opioid prescriptions. Patient is not currently taking opioid prescriptions. Functional ability and status Nutritional status Physical activity Advanced directives List of other physicians Hospitalizations, surgeries, and ER visits in previous 12 months Vitals Screenings to include cognitive, depression, and falls Referrals and appointments  In addition, I have reviewed and discussed with patient certain preventive protocols, quality metrics,  and best practice recommendations. A written personalized care plan for preventive services as well as general preventive health recommendations were provided to patient.   Margette Sheldon, LPN   09/25/4694   After Visit Summary: (MyChart) Due to this being a telephonic visit, the after visit summary with patients personalized plan was offered to patient via MyChart   Notes: Nothing significant to report at this time.

## 2023-09-05 DIAGNOSIS — I371 Nonrheumatic pulmonary valve insufficiency: Secondary | ICD-10-CM | POA: Diagnosis not present

## 2023-09-05 DIAGNOSIS — I2510 Atherosclerotic heart disease of native coronary artery without angina pectoris: Secondary | ICD-10-CM | POA: Diagnosis not present

## 2023-09-05 DIAGNOSIS — I517 Cardiomegaly: Secondary | ICD-10-CM | POA: Diagnosis not present

## 2023-09-05 DIAGNOSIS — Z8774 Personal history of (corrected) congenital malformations of heart and circulatory system: Secondary | ICD-10-CM | POA: Diagnosis not present

## 2023-09-05 DIAGNOSIS — I370 Nonrheumatic pulmonary valve stenosis: Secondary | ICD-10-CM | POA: Diagnosis not present

## 2023-09-05 DIAGNOSIS — Z01818 Encounter for other preprocedural examination: Secondary | ICD-10-CM | POA: Diagnosis not present

## 2023-09-10 DIAGNOSIS — F3341 Major depressive disorder, recurrent, in partial remission: Secondary | ICD-10-CM | POA: Diagnosis not present

## 2023-09-17 ENCOUNTER — Other Ambulatory Visit: Payer: Self-pay | Admitting: *Deleted

## 2023-09-18 ENCOUNTER — Other Ambulatory Visit: Payer: Self-pay | Admitting: *Deleted

## 2023-09-19 MED ORDER — ZOLPIDEM TARTRATE 5 MG PO TABS: 5.0000 mg | ORAL_TABLET | Freq: Every evening | ORAL | 5 refills | Status: DC | PRN

## 2023-10-10 DIAGNOSIS — H40019 Open angle with borderline findings, low risk, unspecified eye: Secondary | ICD-10-CM | POA: Diagnosis not present

## 2023-11-05 NOTE — Progress Notes (Unsigned)
 Cardiology Office Note:    Date:  11/06/2023   ID:  [name], DOB 1955-04-24, MRN [id number]  PCP:  Donah Laymon PARAS, MD   [facility]  Cardiologist:  Lonni LITTIE Nanas, MD  Advanced Practice Provider:  No care team member to display Electrophysiologist:  None   Referring MD: Donah Laymon PARAS, MD     History of Present Illness:    Marie Harvey is a 68 y.o. female with a hx of tetrology of fallot s/p repair 1973, GERD and depression who presents to clinic for follow-up.  Patient underwent repair in 1973 for tetralogy of fallot at [facility]. Was followed with congenital specialist for several years but had not seen a Cardiologist in 40 years. TTE 05/05/20 with LVEF 45-50%, possible small residual VSD, moderately enlarged RV with preserved RV function, d-shaped septum mild PS with mean gradient , peak gradient , severe PR; mild TR. Severely enlarged LA and RA, and hyperechoic structure seen at the apex of RV. Mildly dilated ascending aorta 36mm.  She subsequently underwent cardiac MR on 07/11/20 which demonstrated mild-to-moderate AR, severe PR, moderate PS, ASD, perimembranous VSD, Qp:Qs 1.37, LVEF 50%, mild RV enlargement with preserved RV systolic function, no RV thrombus, moderate RAE, mild ascending aortia dilation.  Zio patch x 3 days 03/2023 showed 5 episodes of SVT with longest lasting 11 beats, frequent PACs (7.6%), occasional supraventricular couplets 2.4%) and triplets (1.5%).  Stress CMR 05/28/2023 showed no evidence of ischemia, status post Tetralogy of Fallot repair with transannular patch with severe pulmonary regurgitation (regurgitant fraction 49%), status post VSD repair with no evidence of significant residual shunt (QP/QS 1.1), LVEF 56%, RVEF 50%, RV insertion site LGE, right sided aortic arch.   Since last clinic visit, she reports she is doing okay.  Reports dyspnea have improved.  Denies any chest pain.  She denies  any lightheadedness, syncope, lower extremity edema, or palpitations.  Past Medical History:  Diagnosis Date   Abnormal RBC    h/o. f/u by PCP   Endometrial hyperplasia without atypia, complex 02/2000   GERD (gastroesophageal reflux disease)    H/O cleft lip    Palate   H/O major depression    H/O tetralogy of Fallot repair 1973   Heart murmur    As a child   Osteoporosis     Past Surgical History:  Procedure Laterality Date   ABDOMINAL SURGERY     CLEFT LIP REPAIR     and palate as child   HERNIA REPAIR     TETRALOGY OF FALLOT REPAIR  1973    Current Medications: Current Meds  Medication Sig   amLODipine  (NORVASC ) 10 MG tablet Take 1 tablet (10 mg total) by mouth daily.   atorvastatin  (LIPITOR) 10 MG tablet Take 1 tablet (10 mg total) by mouth daily.   Cholecalciferol  (D3 VITAMIN PO) Take 5,000 Units by mouth at bedtime.   fluticasone  (FLONASE ) 50 MCG/ACT nasal spray Place 2 sprays into both nostrils daily.   latanoprost (XALATAN) 0.005 % ophthalmic solution PLACE 1 DROP INTO BOTH EYES NIGHTLY.   zolpidem  (AMBIEN ) 5 MG tablet Take 1 tablet (5 mg total) by mouth at bedtime as needed. for sleep     Allergies:   Codeine, Calcium -containing compounds, Penicillins, and Vitamin d  analogs   Social History   Socioeconomic History   Marital status: Single    Spouse name: Not on file   Number of children: 0   Years of education: 37  Highest education level: Some college, no degree  Occupational History   Occupation: CNA    Comment: [employer]  Tobacco Use   Smoking status: Never    Passive exposure: Never   Smokeless tobacco: Never  Vaping Use   Vaping status: Never Used  Substance and Sexual Activity   Alcohol use: No   Drug use: No   Sexual activity: Not Currently    Birth control/protection: None  Other Topics Concern   Not on file  Social History Narrative   Patient lives with her brother and his wife.    Patient has never been married.   No children.    CNA at [employer].   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   Enjoys readying.   Patient goes to the gym 3x per week- strength training and cardio on treadmill.    Social Drivers of Corporate investment banker Strain: Low Risk  (08/27/2023)   Overall Financial Resource Strain (CARDIA)    Difficulty of Paying Living Expenses: Not hard at all  Food Insecurity: No Food Insecurity (08/27/2023)   Hunger Vital Sign    Worried About Running Out of Food in the Last Year: Never true    Ran Out of Food in the Last Year: Never true  Transportation Needs: No Transportation Needs (08/27/2023)   PRAPARE - Administrator, Civil Service (Medical): No    Lack of Transportation (Non-Medical): No  Physical Activity: Insufficiently Active (08/27/2023)   Exercise Vital Sign    Days of Exercise per Week: 2 days    Minutes of Exercise per Session: 30 min  Stress: No Stress Concern Present (08/27/2023)   [employer] of Occupational Health - Occupational Stress Questionnaire    Feeling of Stress : Only a little  Social Connections: Moderately Integrated (08/27/2023)   Social Connection and Isolation Panel    Frequency of Communication with Friends and Family: Once a week    Frequency of Social Gatherings with Friends and Family: More than three times a week    Attends Religious Services: 1 to 4 times per year    Active Member of [employer] or Organizations: Yes    Attends Engineer, structural: More than 4 times per year    Marital Status: Never married     Family History: The patient's family history includes Breast cancer (age of onset: 29) in her maternal aunt; Colon cancer in her mother; Stroke in her father; Uterine cancer in her mother. There is no history of Esophageal cancer.  ROS:   Please see the history of present illness.      EKGs/Labs/Other Studies Reviewed:    The following studies were reviewed today:  Holter Monitor 10/2020: Holter 11/08/2020: *The observed rhythms are sinus bradycardia to  sinus tachycardia. *The Maximum Heart Rate recorded was 138 bpm, Day 3 / 03:31:00 pm, the Minimum Heart Rate recorded was 47 bpm, Day 3 / 04:57:44 am and the Average Heart Rate was 80 bpm. *There were 356 PVCs with a burden of 0.1 %. *There were [phone] PSVCs with a burden of 6.85 %. There were 32 occurrences of Supraventricular Tachycardia with the longest episode 5 beats, Day 3 / 09:51:38 am and the fastest episode 124 bpm, Day 2 / 02:52:27 pm. *There were 0 Patient triggered events. Occasional supraventricular and ventricular ectopy with no sustained arrhythmias Normal heart rate range and variability  Cardiac MRI 07/08/20: FINDINGS: 1. Small left ventricular size, with LVEDD 50 mm, but LVEDVI 44.39 ml/m2.   Normal  left ventricular thickness, with intraventricular septal thickness of 8 mm and posterior wall thickness of 7 mm.   Mild left ventricular systolic dysfunction (LVEF =50%). There are no regional wall motion abnormalities but global hypokinesis.   There is no late gadolinium enhancement in the left ventricular myocardium.   2. Mild right ventricular enlargement with RVEDVI 104 mL/m2.   RVEDV: * : 168.20 ml   RVESV: * : 84.05 ml   RVSV: * : 84.15 ml   RVEF: * : 50.03 %   RVCO: * : 5103.33 ml/min   RVCI: * : 3.16 l/min/m   HR: * : 60.6/min   Normal right ventricular thickness.   Normal right ventricular systolic function (RVEF =50%). There are no regional wall motion abnormalities or aneurysms.   There is no evidence of RV thrombus.   3. Normal left atrial size and moderate right atrial enlargement, with LAESV 37 mL/m2 and RAESV 53 mL/m2.   4. Normal size of the aortic root and main, left, and right pulmonary arteries. Ascending aorta measures 38 mm; mild dilation for age and BSA.   5.  Abnormalities as below:   Tricuspid Valve: Qualitatively trivial regurgitation   Mitral Valve: Qualitatively trivial regurgitation   Aortic Valve:  Mild-to-moderate aortic regurgitation, regurgitation fraction 35%   Pulmonic Valve: Severe pulmonic regurgitation, regurgitation fraction 60%, valve thickening noted, maximum velocity 3.29 m/s consistent with moderate pulmonic stenosis   Atrial septal defects: There is a left atrial to right atrial communication with velocity decoding from left to right consistent with an ASD. This is best seen on the four chamber stack series.   VSD: There is evidence of an perimembranous VSD with velocity decoding consistent with residual left to right flow.   Qp/Qs: 1.37   6.  Normal pericardium.  Trivial pericardial effusion.   7. Grossly, no extracardiac findings; evidence of prior sternotomy noted. Recommended dedicated study if concerned for non-cardiac pathology.   8.  Breathhold artifact noted.   IMPRESSION: 1. Small left ventricular size.   2. Mild left ventricular systolic dysfunction (LVEF =50%).   3. Mild right ventricular enlargement with RVEDVI 104 mL/m2.   4. Normal right ventricular systolic function (RVEF =50%).   5. There is no evidence of RV thrombus.   6. Moderate right atrial enlargement   7. Ascending aorta measures 38 mm; mild dilation for age and BSA.   8. Mild-to-moderate aortic regurgitation.   9. Severe pulmonic regurgitation with regurgitation fraction 60% and valve thickening noted. Moderate pulmonic stenosis   10. Atrial septal defect incidentally noted.   11.  Perimembranous VSD patch with residual flow   12. Trivial pericardial effusion.  TTE 05/05/20: 1. Patient is s/p repair of Tetrology of Fallot.   2. The patient is s/p VSD patch repair. Suspect a small residual,  restrictive VSD in the the membranous poriton of the septum with L-->R  shunting (best seen on clip 15).   3. The pulmonic valve appears thickened with at least moderately  decreased leaflet excursion. There is mild pulmonic stenosis with mean  gradient , peak gradient .  There is severe pulmonic valve  regurgitation.   4. The right ventricular size is moderately enlarged with preserved RV  systolic function. There is normal pulmonary artery systolic pressure.   5. There is a hyperechoic structure seen at the apex of the RV. May  represent trabeculations, however, thrombus is on the differential.  Fotunately, the RV systolic funciton appears preserved. Recommend definity  contrast vs TEE  for further evaluation.   6. Left ventricular ejection fraction, by estimation, is 45 to 50%. The  left ventricle has mildly decreased function. The left ventricle  demonstrates global hypokinesis.   7. The interventricular septum is flattened in diastole ('D' shaped left  ventricle), consistent with right ventricular volume overload.   8. Left atrial size was severely dilated.   9. Right atrial size was severely dilated.  10. The mitral valve is grossly normal. Trivial mitral valve  regurgitation.  11. The tricuspid valve is mildly thickened. There is mild tricuspid  regurgitation  12. The aortic valve is tricuspid. There is mild calcification of the  aortic valve. There is mild thickening of the aortic valve. Aortic valve  regurgitation is mild. Mild aortic valve sclerosis is present, with no  evidence of aortic valve stenosis.  13. Aortic dilatation noted. There is mild dilatation of the ascending  aorta, measuring 36 mm.  14. The inferior vena cava is normal in size with greater than 50%  respiratory variabilty, suggesting right atrial pressure of 3 mmHg.  15. Consider TEE for further evaluation of hyperechoic structure seen in  RV apex and further evaluation of the pulmonic valve.   Comparison(s): Compared to prior echo report in 2013, the pulmonic valve  regurgitation now appears severe with moderate dilation of the RV. There  is evidence of RA/RV pressure overload. The LVEF now appears to be around  50%. The patch repaired VSD is  visualized. There is a small,  residual membranous VSD suspected. Rest of  the changes in detailed report as above.     EKG:   03/28/2023: Atrial flutter, rate 67 04/24/2023:    Recent Labs: 03/28/2023: ALT 11; TSH 2.020 04/02/2023: BUN 18; Creatinine, Ser 0.83; Potassium 4.1; Sodium 136 07/16/2023: Hemoglobin 14.6; Platelets 186   Recent Lipid Panel    Component Value Date/Time   CHOL 157 07/26/2022 1237   TRIG 69 07/26/2022 1237   HDL 62 07/26/2022 1237   CHOLHDL 2.5 07/26/2022 1237   CHOLHDL 2.6 05/25/2014 0843   VLDL 13 05/25/2014 0843   LDLCALC 81 07/26/2022 1237     Physical Exam:    VS:  BP 122/70 (BP Location: Left Arm, Patient Position: Sitting, Cuff Size: Normal)   Pulse 81   Resp 16   Ht 5' 5 (1.651 m)   Wt 127 lb 4.8 oz (57.7 kg)   SpO2 96%   BMI 21.18 kg/m     Wt Readings from Last 3 Encounters:  11/06/23 127 lb 4.8 oz (57.7 kg)  08/27/23 125 lb (56.7 kg)  07/16/23 125 lb (56.7 kg)     GEN:  Well nourished, well developed in no acute distress HEENT: Normal NECK: No JVD; No carotid bruits CARDIAC: RRR, 2/6 systolic murmur. No rubs or gallops RESPIRATORY:  Clear to auscultation without rales, wheezing or rhonchi  ABDOMEN: Soft, non-tender, non-distended MUSCULOSKELETAL:  No edema; No deformity  SKIN: Warm and dry NEUROLOGIC:  Alert and oriented x 3 PSYCHIATRIC:  Normal affect   ASSESSMENT:    1. Severe pulmonary valve regurgitation   2. Primary hypertension   3. Hyperlipidemia, unspecified hyperlipidemia type        PLAN:    In order of problems listed above:  #Tetrology of Fallot s/p Repair: #RV enlargement: #Severe PR Cardiac MRI 06/2020 with LVEF 50%, mild RV enlargement with preserved systolic function, severe PR, moderate PS, ASD, small VSD, mild-to-moderate AI. Now with slight worsening of DOE with inclines/stairs.   Stress CMR  05/28/2023 showed no evidence of ischemia, status post Tetralogy of Fallot repair with transannular patch with severe pulmonary regurgitation  (regurgitant fraction 49%), status post VSD repair with no evidence of significant residual shunt (QP/QS 1.1), LVEF 56%, RVEF 50%, RV insertion site LGE, right sided aortic arch. - Seen by Dr. Theotis 06/2023, CT ordered to evaluate anatomical suitability for transcatheter pulmonic valve replacement.  Has follow-up with Dr. Theotis next month   # Sinus rhythm with PACs EKG 03/2023 initially felt to represent atrial flutter but on review with EP, appears sinus rhythm with PACs.  Zio patch x 3 days 03/2023 showed 5 episodes of SVT with longest lasting 11 beats, frequent PACs (7.6%), occasional supraventricular couplets 2.4%) and triplets (1.5%).   #HTN: On amlodipine  10 mg daily.  Previously on valsartan  but discontinued due to hyperkalemia   #HLD: -Continue crestor  5mg  daily.  LDL 81 on 07/26/2022   RTC in 6 months     Medication Adjustments/Labs and Tests Ordered: Current medicines are reviewed at length with the patient today.  Concerns regarding medicines are outlined above.   No orders of the defined types were placed in this encounter.  No orders of the defined types were placed in this encounter.  Patient Instructions  Medication Instructions:  Your physician recommends that you continue on your current medications as directed. Please refer to the Current Medication list given to you today.  *If you need a refill on your cardiac medications before your next appointment, please call your pharmacy*  Lab Work: NONE  Testing/Procedures: NONE  Follow-Up: At [facility], you and your health needs are our priority.  As part of our continuing mission to provide you with exceptional heart care, we have created designated Provider Care Teams.  These Care Teams include your primary Cardiologist (physician) and Advanced Practice Providers (APPs -  Physician Assistants and Nurse Practitioners) who all work together to provide you with the care you need, when you need it.  We  recommend signing up for the patient portal called MyChart.  Sign up information is provided on this After Visit Summary.  MyChart is used to connect with patients for Virtual Visits (Telemedicine).  Patients are able to view lab/test results, encounter notes, upcoming appointments, etc.  Non-urgent messages can be sent to your provider as well.   To learn more about what you can do with MyChart, go to [email or web address].    Your next appointment:   6 month(s)  The format for your next appointment:   In Person  Provider:   DR [facility] OR NP/PA        Signed, Lonni LITTIE Nanas, MD  11/06/2023 5:31 PM    [facility]

## 2023-11-06 ENCOUNTER — Encounter (HOSPITAL_BASED_OUTPATIENT_CLINIC_OR_DEPARTMENT_OTHER): Payer: Self-pay | Admitting: Cardiology

## 2023-11-06 ENCOUNTER — Ambulatory Visit (INDEPENDENT_AMBULATORY_CARE_PROVIDER_SITE_OTHER): Admitting: Cardiology

## 2023-11-06 VITALS — BP 122/70 | HR 81 | Resp 16 | Ht 65.0 in | Wt 127.3 lb

## 2023-11-06 DIAGNOSIS — E785 Hyperlipidemia, unspecified: Secondary | ICD-10-CM | POA: Diagnosis not present

## 2023-11-06 DIAGNOSIS — I10 Essential (primary) hypertension: Secondary | ICD-10-CM | POA: Diagnosis not present

## 2023-11-06 DIAGNOSIS — I371 Nonrheumatic pulmonary valve insufficiency: Secondary | ICD-10-CM | POA: Diagnosis not present

## 2023-11-06 NOTE — Patient Instructions (Signed)
 Medication Instructions:  Your physician recommends that you continue on your current medications as directed. Please refer to the Current Medication list given to you today.  *If you need a refill on your cardiac medications before your next appointment, please call your pharmacy*  Lab Work: NONE  Testing/Procedures: NONE  Follow-Up: At [facility], you and your health needs are our priority.  As part of our continuing mission to provide you with exceptional heart care, we have created designated Provider Care Teams.  These Care Teams include your primary Cardiologist (physician) and Advanced Practice Providers (APPs -  Physician Assistants and Nurse Practitioners) who all work together to provide you with the care you need, when you need it.  We recommend signing up for the patient portal called MyChart.  Sign up information is provided on this After Visit Summary.  MyChart is used to connect with patients for Virtual Visits (Telemedicine).  Patients are able to view lab/test results, encounter notes, upcoming appointments, etc.  Non-urgent messages can be sent to your provider as well.   To learn more about what you can do with MyChart, go to [email or web address].    Your next appointment:   6 month(s)  The format for your next appointment:   In Person  Provider:   DR [facility] OR NP/PA

## 2023-12-03 DIAGNOSIS — I491 Atrial premature depolarization: Secondary | ICD-10-CM | POA: Diagnosis not present

## 2023-12-03 DIAGNOSIS — Z8774 Personal history of (corrected) congenital malformations of heart and circulatory system: Secondary | ICD-10-CM | POA: Diagnosis not present

## 2023-12-03 DIAGNOSIS — I371 Nonrheumatic pulmonary valve insufficiency: Secondary | ICD-10-CM | POA: Diagnosis not present

## 2023-12-03 DIAGNOSIS — I370 Nonrheumatic pulmonary valve stenosis: Secondary | ICD-10-CM | POA: Diagnosis not present

## 2023-12-20 ENCOUNTER — Encounter: Payer: Self-pay | Admitting: Family Medicine

## 2023-12-20 ENCOUNTER — Ambulatory Visit (INDEPENDENT_AMBULATORY_CARE_PROVIDER_SITE_OTHER): Admitting: Family Medicine

## 2023-12-20 VITALS — BP 124/66 | HR 70 | Ht 65.0 in | Wt 128.2 lb

## 2023-12-20 DIAGNOSIS — D582 Other hemoglobinopathies: Secondary | ICD-10-CM

## 2023-12-20 DIAGNOSIS — G4700 Insomnia, unspecified: Secondary | ICD-10-CM | POA: Diagnosis not present

## 2023-12-20 DIAGNOSIS — I10 Essential (primary) hypertension: Secondary | ICD-10-CM

## 2023-12-20 DIAGNOSIS — E785 Hyperlipidemia, unspecified: Secondary | ICD-10-CM

## 2023-12-20 DIAGNOSIS — Z0000 Encounter for general adult medical examination without abnormal findings: Secondary | ICD-10-CM | POA: Diagnosis not present

## 2023-12-20 DIAGNOSIS — M810 Age-related osteoporosis without current pathological fracture: Secondary | ICD-10-CM

## 2023-12-20 MED ORDER — ZOLPIDEM TARTRATE 5 MG PO TABS: 5.0000 mg | ORAL_TABLET | Freq: Every evening | ORAL | 5 refills | Status: AC | PRN

## 2023-12-20 NOTE — Assessment & Plan Note (Signed)
 Update lipids today along with BMET

## 2023-12-20 NOTE — Assessment & Plan Note (Signed)
 Chronic insomnia managed with zolpidem  5 mg nightly, effective without side effects. Reduced exercise may contribute to sleep difficulties. - Continue zolpidem  5 mg oral at bedtime. - Encourage regular exercise to improve sleep quality.

## 2023-12-20 NOTE — Assessment & Plan Note (Signed)
-   Received influenza vaccination 2 days ago - Plans to receive COVID vaccination in November

## 2023-12-20 NOTE — Assessment & Plan Note (Signed)
 Well controlled. Continue current medication regimen.

## 2023-12-20 NOTE — Patient Instructions (Signed)
 It was great to see you again today.  Stay on current medications Checking cholesterol today  Follow up in 6 months   Be well, Dr. Donah

## 2023-12-20 NOTE — Assessment & Plan Note (Signed)
 Reviewed risk of fractures if she were to fall, elects to wait until after her surgery to discuss this further

## 2023-12-20 NOTE — Progress Notes (Signed)
  Date of Visit: 12/20/2023   SUBJECTIVE:   HPI:  Discussed the use of AI scribe software for clinical note transcription with the patient, who gave verbal consent to proceed.  History of Present Illness Marie Harvey is a 68 year old female with sleep issues who presents for a follow-up.  Sleep disturbance - Chronic difficulty with sleep - Zolpidem  5 mg nightly has been helpful for sleep without causing side effects  Hypertension - Takes amlodipine  10 mg daily for blood pressure control  Hyperlipidemia - Takes atorvastatin  10mg  daily for cholesterol management  Osteoporosis - History of osteoporosis, not presently on current medications - requests to defer discussion of this until after her valve replacement in January  Having upcoming transcatheter pulmonary valve replacement in January via [facility]   OBJECTIVE:   BP 124/66   Pulse 70   Ht 5' 5 (1.651 m)   Wt 128 lb 3.2 oz (58.2 kg)   SpO2 96%   BMI 21.33 kg/m  Gen: no acute distress, pleasant, cooperative HEENT: normocephalic, atraumatic  Lungs: normal work of breathing, clear to auscultation bilaterally  Neuro: alert, grossly nonfocal, speech normal Ext: No appreciable lower extremity edema bilaterally  Psych: normal range of affect, well groomed, speech normal in rate and volume, normal eye contact   ASSESSMENT/PLAN:   Assessment & Plan Elevated hemoglobin Improved on last lab draw, recheck CBC today Hyperlipidemia, unspecified hyperlipidemia type Update lipids today along with BMET Routine adult health maintenance - Received influenza vaccination 2 days ago - Plans to receive COVID vaccination in November Insomnia, unspecified type Chronic insomnia managed with zolpidem  5 mg nightly, effective without side effects. Reduced exercise may contribute to sleep difficulties. - Continue zolpidem  5 mg oral at bedtime. - Encourage regular exercise to improve sleep quality. Primary hypertension Well  controlled. Continue current medication regimen.  Osteoporosis without current pathological fracture, unspecified osteoporosis type Reviewed risk of fractures if she were to fall, elects to wait until after her surgery to discuss this further    FOLLOW UP: Follow up in 6 mos for above issues  [place] J. Donah, MD [facility]

## 2023-12-21 ENCOUNTER — Ambulatory Visit: Payer: Self-pay | Admitting: Family Medicine

## 2023-12-21 LAB — CBC
Hematocrit: 45.9 % (ref 34.0–46.6)
Hemoglobin: 14.4 g/dL (ref 11.1–15.9)
MCH: 29.2 pg (ref 26.6–33.0)
MCHC: 31.4 g/dL — ABNORMAL LOW (ref 31.5–35.7)
MCV: 93 fL (ref 79–97)
Platelets: 157 x10E3/uL (ref 150–450)
RBC: 4.93 x10E6/uL (ref 3.77–5.28)
RDW: 12.7 % (ref 11.7–15.4)
WBC: 4 x10E3/uL (ref 3.4–10.8)

## 2023-12-21 LAB — BASIC METABOLIC PANEL WITH GFR
BUN/Creatinine Ratio: 21 (ref 12–28)
BUN: 15 mg/dL (ref 8–27)
CO2: 24 mmol/L (ref 20–29)
Calcium: 9.9 mg/dL (ref 8.7–10.3)
Chloride: 104 mmol/L (ref 96–106)
Creatinine, Ser: 0.71 mg/dL (ref 0.57–1.00)
Glucose: 90 mg/dL (ref 70–99)
Potassium: 4.5 mmol/L (ref 3.5–5.2)
Sodium: 142 mmol/L (ref 134–144)
eGFR: 92 mL/min/1.73 (ref >59)

## 2023-12-21 LAB — LIPID PANEL
Chol/HDL Ratio: 2.5 ratio (ref 0.0–4.4)
Cholesterol, Total: 152 mg/dL (ref 100–199)
HDL: 62 mg/dL (ref >39)
LDL Chol Calc (NIH): 78 mg/dL (ref 0–99)
Triglycerides: 57 mg/dL (ref 0–149)
VLDL Cholesterol Cal: 12 mg/dL (ref 5–40)

## 2024-03-03 DIAGNOSIS — H40013 Open angle with borderline findings, low risk, bilateral: Secondary | ICD-10-CM | POA: Diagnosis not present

## 2024-04-24 ENCOUNTER — Emergency Department (HOSPITAL_COMMUNITY)

## 2024-04-24 ENCOUNTER — Other Ambulatory Visit: Payer: Self-pay

## 2024-04-24 ENCOUNTER — Encounter: Payer: Self-pay | Admitting: Family Medicine

## 2024-04-24 ENCOUNTER — Emergency Department (HOSPITAL_COMMUNITY)
Admission: EM | Admit: 2024-04-24 | Discharge: 2024-04-25 | Disposition: A | Discharge status: Home / Self Care | Source: Home / Self Care | Attending: Emergency Medicine | Admitting: Emergency Medicine

## 2024-04-24 ENCOUNTER — Ambulatory Visit: Admitting: Family Medicine

## 2024-04-24 ENCOUNTER — Encounter (HOSPITAL_COMMUNITY): Payer: Self-pay

## 2024-04-24 VITALS — BP 109/40 | HR 117 | Ht 65.0 in | Wt 117.0 lb

## 2024-04-24 DIAGNOSIS — J189 Pneumonia, unspecified organism: Secondary | ICD-10-CM

## 2024-04-24 DIAGNOSIS — R627 Adult failure to thrive: Secondary | ICD-10-CM

## 2024-04-24 DIAGNOSIS — Z952 Presence of prosthetic heart valve: Secondary | ICD-10-CM

## 2024-04-24 LAB — COMPREHENSIVE METABOLIC PANEL WITH GFR
ALT: 12 U/L (ref 0–44)
AST: 25 U/L (ref 15–41)
Albumin: 4.8 g/dL (ref 3.5–5.0)
Alkaline Phosphatase: 95 U/L (ref 38–126)
Anion gap: 16 — ABNORMAL HIGH (ref 5–15)
BUN: 23 mg/dL (ref 8–23)
CO2: 27 mmol/L (ref 22–32)
Calcium: 10.7 mg/dL — ABNORMAL HIGH (ref 8.9–10.3)
Chloride: 94 mmol/L — ABNORMAL LOW (ref 98–111)
Creatinine, Ser: 0.9 mg/dL (ref 0.44–1.00)
GFR, Estimated: >60 mL/min
Glucose, Bld: 115 mg/dL — ABNORMAL HIGH (ref 70–99)
Potassium: 4 mmol/L (ref 3.5–5.1)
Sodium: 137 mmol/L (ref 135–145)
Total Bilirubin: 1.6 mg/dL — ABNORMAL HIGH (ref 0.0–1.2)
Total Protein: 7.8 g/dL (ref 6.5–8.1)

## 2024-04-24 LAB — CBC WITH DIFFERENTIAL/PLATELET
Abs Immature Granulocytes: 0.03 10*3/uL (ref 0.00–0.07)
Basophils Absolute: 0.1 10*3/uL (ref 0.0–0.1)
Basophils Relative: 1 %
Eosinophils Absolute: 0 10*3/uL (ref 0.0–0.5)
Eosinophils Relative: 0 %
HCT: 45.9 % (ref 36.0–46.0)
Hemoglobin: 16.8 g/dL — ABNORMAL HIGH (ref 12.0–15.0)
Immature Granulocytes: 0 %
Lymphocytes Relative: 9 %
Lymphs Abs: 0.7 10*3/uL (ref 0.7–4.0)
MCH: 31.3 pg (ref 26.0–34.0)
MCHC: 36.6 g/dL — ABNORMAL HIGH (ref 30.0–36.0)
MCV: 85.6 fL (ref 80.0–100.0)
Monocytes Absolute: 0.8 10*3/uL (ref 0.1–1.0)
Monocytes Relative: 9 %
Neutro Abs: 6.5 10*3/uL (ref 1.7–7.7)
Neutrophils Relative %: 81 %
Platelets: 219 10*3/uL (ref 150–400)
RBC: 5.36 MIL/uL — ABNORMAL HIGH (ref 3.87–5.11)
RDW: 12.1 % (ref 11.5–15.5)
WBC: 8 10*3/uL (ref 4.0–10.5)
nRBC: 0 % (ref 0.0–0.2)

## 2024-04-24 LAB — TROPONIN T, HIGH SENSITIVITY
Troponin T High Sensitivity: 23 ng/L — ABNORMAL HIGH (ref 0–19)
Troponin T High Sensitivity: 23 ng/L — ABNORMAL HIGH (ref 0–19)

## 2024-04-24 LAB — RESP PANEL BY RT-PCR (RSV, FLU A&B, COVID)  RVPGX2
Influenza A by PCR: NEGATIVE
Influenza B by PCR: NEGATIVE
Resp Syncytial Virus by PCR: NEGATIVE
SARS Coronavirus 2 by RT PCR: NEGATIVE

## 2024-04-24 LAB — PRO BRAIN NATRIURETIC PEPTIDE: Pro Brain Natriuretic Peptide: 444 pg/mL — ABNORMAL HIGH

## 2024-04-24 MED ORDER — SODIUM CHLORIDE 0.9 % IV BOLUS
1000.0000 mL | Freq: Once | INTRAVENOUS | Status: AC
  Administered 2024-04-24: 1000 mL via INTRAVENOUS

## 2024-04-24 MED ORDER — IOHEXOL 350 MG/ML SOLN
60.0000 mL | Freq: Once | INTRAVENOUS | Status: AC | PRN
  Administered 2024-04-24: 60 mL via INTRAVENOUS

## 2024-04-24 MED ORDER — ONDANSETRON HCL 4 MG/2ML IJ SOLN
4.0000 mg | Freq: Once | INTRAMUSCULAR | Status: AC
  Administered 2024-04-24: 4 mg via INTRAVENOUS
  Filled 2024-04-24: qty 2

## 2024-04-24 NOTE — ED Provider Notes (Addendum)
 Patient here with some shortness of breath generalized weakness since she had pulmonary stent placed by [facility] cardiology team.  History of tetralogy of Fallot.  She had a recent TPVR at [facility].  A little short of breath at the clinic.  Maybe some EKG changes.  Here she has some T wave inversions anteriorly which are kind of seen on prior EKGs.  She does not appear to be in any distress.  She has been home for about a week but sounds like she has been eating or drinking much.  She lives by herself her brother is with her.  Ultimately will evaluate with CTA basic labs troponin BNP.  Chest x-ray done shows no signs of volume overload or obvious infectious process.  Will give fluid bolus get labs and imaging and reach out to the cardiology team at [facility].  Please see my resident note for further results evaluation and disposition of the patient.  Patient had pneumonia.  We talked with [facility] team Dr. Michaelle accepted the patient in transfer to admission for [facility].  This chart was dictated using voice recognition software.  Despite best efforts to proofread,  errors can occur which can change the documentation meaning.    Ruthe Cornet, DO 04/24/24 1623    Ruthe Cornet, DO 04/24/24 2135

## 2024-04-24 NOTE — ED Notes (Signed)
 Verbal consent given via patient for transfer to [facility]

## 2024-04-24 NOTE — ED Notes (Addendum)
 Receiving hospital was called--report given to Clarita, CHARITY FUNDRAISER.

## 2024-04-24 NOTE — ED Notes (Signed)
 [facility] called with pt. Transfer update; accepting doctor Lynwood Minerva; [facility] [address] [facility] ([address] [place], [place] [address]) Report #: [phone]; [facility] called and will be picking pt. Up within a couple hours

## 2024-04-24 NOTE — ED Triage Notes (Signed)
 Here from [facility]. Sent to ED for cardiac w/u. Recent TPVR at [facility]. Noticed EKG changes anterolaterally,  and sob in clinic this am. Recommended  local ED w/u. Pt alert, NAD, calm, interactive, resps e/u, speaking in clear complete sentences. Steady gait.

## 2024-04-24 NOTE — Patient Instructions (Addendum)
 Please go to the [facility] Emergency Room and show them this paperwork.  FYI Triage Team: Patient just had TPVR at [facility] Feeling fatigue, nauseated, shortness of breath with exertion No hypoxia on ambulation today in clinic EKG done today in office has new ischemic changes anterolaterally PCP spoke with congenital heart team at [facility] (Dr. Brad Search) who did her procedure and they recommended local ED evaluation for troponin and possible CTA. Per [facility] team, she could possibly have LAD compression from TPVR stent  Dr. Donah

## 2024-04-24 NOTE — ED Notes (Signed)
 Called [facility] and transfer call to  PA ZELAYA

## 2024-04-24 NOTE — Progress Notes (Signed)
"  °  Date of Visit: 04/24/2024   SUBJECTIVE:   HPI:  Discussed the use of AI scribe software for clinical note transcription with the patient, who gave verbal consent to proceed.  History of Present Illness Marie Harvey is a 69 year old female who presents with persistent nausea, poor oral intake, and shortness of breath following a recent transcatheter pulmonic valve replacement.  Post-procedural nausea and poor oral intake - Persistent nausea since transcatheter pulmonic valve replacement on January 27th, with some improvement in severity - Minimal oral intake since discharge on January 29th, limited to a few bites of food, primarily yogurt, juice, and water - Poor appetite continues - Inability to take medications regularly due to nausea, just became able to take po medications yesterday  Exertional dyspnea and oxygen desaturation - Shortness of breath persisting for several days, especially with minimal exertion such as walking to the bathroom - Requires rest after minimal activity due to feeling 'oxygen deprived' - During hospitalization, oxygen saturation dropped into the low eighties at rest, requiring supplemental oxygen & diuresis after her procedure. Was discharged with lasix 20mg  daily but could not take this PO until yesterday - Currently experiences shortness of breath with minimal activity - denies chest pain at present or since procedure    OBJECTIVE:   BP (!) 109/40   Pulse (!) 117   Ht 5' 5 (1.651 m)   Wt 117 lb (53.1 kg)   SpO2 100%   BMI 19.47 kg/m  Gen: appears fatigued but no acute distress HEENT: moist mucous membranes  Heart: mildly tachycardic, regular rhythm Lungs: clear to auscultation bilaterally, normal work of breathing on room air at rest and while ambulating Neuro: alert, speech normal, grossly nonfocal Ext: no peripheral edema  Ambulatory pulse ox remained 97% and above in clinic. Heart rates rose to 130s-140s while  ambulating.  ASSESSMENT/PLAN:   Assessment & Plan Heart valve replaced With nausea, tachycardia, dyspnea since discharge from [facility]. Suspect she may have had a viral GI infection causing nausea that is now improving. Appears reasonably well hydrated on exam without signs of volume overload. More concerning however is her fatigue and dyspnea with minimal exertion. No hypoxia on ambulation in clinic today. - EKG obtained and shows sinus tachycardia with ST/Twave abnormalities in anterolateral leads.  - drew labs: CBC, BMET, Mag - Attempted to reach her primary cardiologist Dr. Theotis, who was involved in her surgery. Received callback from Dr. Brad Search with the Adult [facility] program at [facility]. Shared patient's presentation, exam findings, and EKG with Dr. Search. She discussed with interventional cardiologist at [facility] who did patient's procedure, and they recommend evaluation in local ED for troponin and possible CTA given EKG changes. Reported patient could have possible compression of LAD from pulmonary stent if things have shifted since procedure.  - patient & brother will report to [facility], they prefer to go via private vehicle. I have notified ED charge nurse of patient's pending arrival.   Nyesha Cliff J. Donah, MD [facility]  Greater than 60 minutes were spent on this encounter on the day of service, including pre-visit planning, actual face to face time, coordination of care, and documentation of visit.  "

## 2024-04-24 NOTE — ED Provider Notes (Signed)
 " [facility]PARTMENT AT [facility] Provider Note   CSN: [id number] Arrival date & time: 04/24/24  1340     Patient presents with: Shortness of Breath   Marie Harvey is a 69 y.o. female with past medical history notable for tetralogy of Fallot status post transannular patch repair at 69 years of age, pulmonary insufficiency and pulmonary stenosis status post recent transcatheter pulmonary valve replacement who presents today for evaluation of worsening shortness of breath.  Patient was just discharged by cardiology service over at [facility] on 1/29 after elective replacement of pulmonary valve.  Patient reports that during her hospitalization she did have mild diarrhea but otherwise without any shortness of breath.  Since returning home she has reported worsening shortness of breath that is persistent while at rest.  Denies any orthopnea, lower extremity swelling or chest pain.  Also has had poor exertional dyspnea as well.  Was seen by PCP today with subsequent EKG showing findings concerning for new anterior lateral changes.  On further discussion with the [facility] cardiology service, recommended coming to the emergency department for further evaluation.  There is a potential that her pulmonary valve may be compressing coronary vessels.   Shortness of Breath      Prior to Admission medications  Medication Sig Start Date End Date Taking? Authorizing Provider  amLODipine  (NORVASC ) 10 MG tablet Take 1 tablet (10 mg total) by mouth daily. 04/24/23   Kate Lonni CROME, MD  aspirin EC 81 MG tablet Take 81 mg by mouth daily. Swallow whole.    [provider]  atorvastatin  (LIPITOR) 10 MG tablet Take 1 tablet (10 mg total) by mouth daily. 03/26/23   Donah Laymon PARAS, MD  Cholecalciferol  (D3 VITAMIN PO) Take 5,000 Units by mouth at bedtime.    [provider]  doxycycline (VIBRA-TABS) 100 MG tablet Take 100 mg by mouth. Take one tablet by mouth 30-60 mins before  dental appointments 04/17/24 04/16/25  [provider]  fluticasone  (FLONASE ) 50 MCG/ACT nasal spray Place 2 sprays into both nostrils daily. 11/14/13   Sharlet Lynwood KIDD, MD  furosemide (LASIX) 20 MG tablet Take 20 mg by mouth. 04/17/24 04/17/25  [provider]  latanoprost (XALATAN) 0.005 % ophthalmic solution PLACE 1 DROP INTO BOTH EYES NIGHTLY. 06/09/15   [provider]  zolpidem  (AMBIEN ) 5 MG tablet Take 1 tablet (5 mg total) by mouth at bedtime as needed. for sleep 12/20/23   Donah Laymon PARAS, MD    Allergies: Codeine, Calcium -containing compounds, Penicillins, and Vitamin d  analogs    Review of Systems  Respiratory:  Positive for shortness of breath.     Updated Vital Signs BP (!) 162/90 (BP Location: Left Arm)   Pulse (!) 101   Temp 97.8 F (36.6 C)   Resp 16   Ht 5' 5 (1.651 m)   Wt 53.1 kg   SpO2 95%   BMI 19.47 kg/m   Physical Exam Constitutional:      General: She is not in acute distress. HENT:     Head: Normocephalic.  Eyes:     Pupils: Pupils are equal, round, and reactive to light.  Cardiovascular:     Rate and Rhythm: Normal rate and regular rhythm.  Pulmonary:     Effort: Pulmonary effort is normal.  Abdominal:     Palpations: Abdomen is soft.  Musculoskeletal:        General: Normal range of motion.     Right lower leg: No edema.  Left lower leg: No edema.  Skin:    General: Skin is warm.     Capillary Refill: Capillary refill takes less than 2 seconds.  Neurological:     General: No focal deficit present.     Mental Status: She is alert.  Psychiatric:        Mood and Affect: Mood normal.     (all labs ordered are listed, but only abnormal results are displayed) Labs Reviewed  CBC WITH DIFFERENTIAL/PLATELET - Abnormal; Notable for the following components:      Result Value   RBC 5.36 (*)    Hemoglobin 16.8 (*)    MCHC 36.6 (*)    All other components within normal limits  RESP PANEL BY RT-PCR (RSV, FLU A&B,  COVID)  RVPGX2  COMPREHENSIVE METABOLIC PANEL WITH GFR  PRO BRAIN NATRIURETIC PEPTIDE  TROPONIN T, HIGH SENSITIVITY    EKG: None  Radiology: [facility] Chest Port 1 View Result Date: 04/24/2024 CLINICAL DATA:  Recent transcatheter pulmonary valve replacement presenting with shortness of breath. EXAM: PORTABLE CHEST 1 VIEW COMPARISON:  May 16, 2011 FINDINGS: Multiple sternal wires are present. The cardiac silhouette is mildly enlarged and unchanged in size. Interval transcatheter pulmonary valve replacement is noted since the prior study. The lungs are hyperinflated. Mild left basilar atelectasis and/or infiltrate is seen. No pleural effusion or pneumothorax is identified. There is stable scoliosis of the thoracic spine with multilevel degenerative changes. IMPRESSION: 1. Interval transcatheter pulmonary valve replacement. 2. Mild left basilar atelectasis and/or infiltrate. Electronically Signed   By: Suzen Dials M.D.   On: 04/24/2024 15:25    Procedures   Medications Ordered in the ED - No data to display  Clinical Course as of 04/24/24 2313  Thu Apr 24, 2024  2056 Marie Harvey dhp [DZ]    Clinical Course User Index [DZ] Laurita Sieving, MD                               Medical Decision Making Amount and/or Complexity of Data Reviewed Radiology: ordered.  Risk Prescription drug management.   Patient is a 70 year old female who presents today for evaluation of worsening shortness of breath in setting of recent elective pulmonary valve replacement.  On initial assessment patient was noted to be mildly tachycardic but otherwise hemodynamically stable and afebrile.  On my bedside assessment patient was noted to be resting comfortable without acute distress.  Respirations currently even and unlabored.  Pulmonary auscultation without any acute abnormalities.  No evidence of lower extremity edema.  Differential diagnosis remains broad at this point in time.  Certainly with her recent  procedure there is consideration for possible postprocedural complications, heart failure exacerbation, volume overload, pulmonary embolism, pneumonia, viral process, electrolyte derangements.  Will obtain laboratory evaluation and imaging studies here for further assessment.  EKG here showing evidence of sinus tachycardia with chronic.  T wave inversions of the inferior and anterior leads, present on prior evaluation..  Nonspecific ST and T wave changes in the anterior lateral leads as well..  Workup here was notable for labs back that showed unremarkable CBC, metabolic panel.  Troponin elevated at 23 and remained flat at 23.  Opacities of the lower lobe bilaterally on CTA PE without any evidence of thromboembolic process.  On reassessment patient remained clinically and hemodynamically stable.  He did have persistent elevated heart rate here in the emergency department with in the 100s to 110s.  On further discussion  patient has also had significant poor p.o. intake since her discharge.  On my chart review patient has had a 4 kg weight loss since her admission recently.  I think she will need admission at this time for further IV fluid resuscitation as well as monitoring to make sure she is able to tolerate p.o. at home.  There does not appear to be any evidence at this point in time concerning for ACS, acute heart failure, acute aortic pathology or pulmonary embolism.  I spoke to the cardiology service over read to given her recent admission and procedure.  They feel that she should be transferred to their facility at this point in time for further management of the above.  Patient is agreeable with plan moving forward.  Patient will be admitted to the inpatient service at [facility] under Dr. Margrette.   Final diagnoses:  Failure to thrive in adult  Community acquired pneumonia, unspecified laterality    ED Discharge Orders     None          Laurita Sieving, MD 04/24/24 2318    Ruthe Cornet,  DO 04/24/24 2328  "

## 2024-04-24 NOTE — ED Notes (Signed)
 EDP at bedside, x-ray at bedside

## 2024-04-25 LAB — CBC WITH DIFFERENTIAL/PLATELET
Basophils Absolute: 0.1 10*3/uL (ref 0.0–0.2)
Basos: 1 %
EOS (ABSOLUTE): 0 10*3/uL (ref 0.0–0.4)
Eos: 0 %
Hematocrit: 47.4 % — ABNORMAL HIGH (ref 34.0–46.6)
Hemoglobin: 16.5 g/dL — ABNORMAL HIGH (ref 11.1–15.9)
Immature Grans (Abs): 0.1 10*3/uL (ref 0.0–0.1)
Immature Granulocytes: 1 %
Lymphocytes Absolute: 0.7 10*3/uL (ref 0.7–3.1)
Lymphs: 8 %
MCH: 30.7 pg (ref 26.6–33.0)
MCHC: 34.8 g/dL (ref 31.5–35.7)
MCV: 88 fL (ref 79–97)
Monocytes Absolute: 0.9 10*3/uL (ref 0.1–0.9)
Monocytes: 10 %
Neutrophils Absolute: 7.4 10*3/uL — ABNORMAL HIGH (ref 1.4–7.0)
Neutrophils: 80 %
Platelets: 238 10*3/uL (ref 150–450)
RBC: 5.37 x10E6/uL — ABNORMAL HIGH (ref 3.77–5.28)
RDW: 12.3 % (ref 11.7–15.4)
WBC: 9.1 10*3/uL (ref 3.4–10.8)

## 2024-04-25 LAB — BASIC METABOLIC PANEL WITH GFR
BUN/Creatinine Ratio: 27 (ref 12–28)
BUN: 23 mg/dL (ref 8–27)
CO2: 23 mmol/L (ref 20–29)
Calcium: 10.5 mg/dL — ABNORMAL HIGH (ref 8.7–10.3)
Chloride: 95 mmol/L — ABNORMAL LOW (ref 96–106)
Creatinine, Ser: 0.85 mg/dL (ref 0.57–1.00)
Glucose: 121 mg/dL — ABNORMAL HIGH (ref 70–99)
Potassium: 3.8 mmol/L (ref 3.5–5.2)
Sodium: 138 mmol/L (ref 134–144)
eGFR: 74 mL/min/{1.73_m2}

## 2024-04-25 LAB — MAGNESIUM: Magnesium: 2 mg/dL (ref 1.6–2.3)

## 2024-08-28 ENCOUNTER — Encounter
